# Patient Record
Sex: Male | Born: 1960
Health system: Southern US, Community
[De-identification: ages and names within clinical notes are randomized; demographics above are authoritative.]

## PROBLEM LIST (undated history)

## (undated) ENCOUNTER — Emergency Department (HOSPITAL_COMMUNITY): Admission: EM | Payer: 59 | Source: Home / Self Care

## (undated) HISTORY — PX: FOOT SURGERY: SHX648

---

## 2001-11-08 ENCOUNTER — Emergency Department (HOSPITAL_COMMUNITY): Admission: EM | Admit: 2001-11-08 | Discharge: 2001-11-08 | Payer: Self-pay | Admitting: Emergency Medicine

## 2001-12-26 HISTORY — PX: HEMORROIDECTOMY: SUR656

## 2003-06-06 ENCOUNTER — Ambulatory Visit (HOSPITAL_COMMUNITY): Admission: RE | Admit: 2003-06-06 | Discharge: 2003-06-06 | Payer: Self-pay | Admitting: General Surgery

## 2003-06-19 ENCOUNTER — Ambulatory Visit (HOSPITAL_COMMUNITY): Admission: RE | Admit: 2003-06-19 | Discharge: 2003-06-19 | Payer: Self-pay | Admitting: Podiatry

## 2004-05-21 ENCOUNTER — Ambulatory Visit (HOSPITAL_COMMUNITY): Admission: RE | Admit: 2004-05-21 | Discharge: 2004-05-21 | Payer: Self-pay | Admitting: General Surgery

## 2004-09-04 ENCOUNTER — Emergency Department (HOSPITAL_COMMUNITY): Admission: EM | Admit: 2004-09-04 | Discharge: 2004-09-04 | Payer: Self-pay | Admitting: Emergency Medicine

## 2005-12-06 ENCOUNTER — Emergency Department (HOSPITAL_COMMUNITY): Admission: EM | Admit: 2005-12-06 | Discharge: 2005-12-06 | Payer: Self-pay | Admitting: Emergency Medicine

## 2007-02-08 ENCOUNTER — Emergency Department (HOSPITAL_COMMUNITY): Admission: EM | Admit: 2007-02-08 | Discharge: 2007-02-08 | Payer: Self-pay | Admitting: Emergency Medicine

## 2009-06-01 ENCOUNTER — Emergency Department (HOSPITAL_COMMUNITY): Admission: EM | Admit: 2009-06-01 | Discharge: 2009-06-01 | Payer: Self-pay | Admitting: Family Medicine

## 2011-05-13 NOTE — H&P (Signed)
   NAME:  Gordon Richards, Gordon Richards                           ACCOUNT NO.:  [id number]   MEDICAL RECORD NO.:  [id number]                   PATIENT TYPE:  AMB   LOCATION:  DAY                                  FACILITY:  [facility]   PHYSICIAN:  Dalia Heading, M.D.               DATE OF BIRTH:  [birth year]   DATE OF ADMISSION:  DATE OF DISCHARGE:                                HISTORY & PHYSICAL   CHIEF COMPLAINT:  Hemorrhoidal disease.   HISTORY OF PRESENT ILLNESS:  The patient is a 50 year old white male who  is referred for evaluation and treatment of hemorrhoidal disease.  He has  had occasional hemorrhoidal problems in the past.  He started having a  painful, kind of swollen hemorrhoid lately.  It is irritated at the present  time.  Occasional blood is noted on the toilet paper when he wipes himself.  No fever, constipation or diarrhea have been noted.   PAST MEDICAL HISTORY:  Unremarkable.   PAST SURGICAL HISTORY:  Unremarkable.   MEDICATIONS:  None.   ALLERGIES:  No known drug allergies.   REVIEW OF SYSTEMS:  Noncontributory.   PHYSICAL EXAMINATION:  GENERAL APPEARANCE:  The patient is a well-developed,  well-nourished white male in no acute distress.  VITAL SIGNS:  Afebrile, stable.  LUNGS:  Clear to auscultation.  Breath sounds bilaterally.  HEART:  Regular rate and rhythm without S3, S4 or murmurs.  RECTAL:  An inflamed hemorrhoid noted at the 6 o'clock position.  External  hemorrhoidal skin tag is noted at the 12 o'clock  position.   IMPRESSION:  Hemorrhoidal disease.   PLAN:  The patient is scheduled for a hemorrhoidectomy on June 06, 2003.  The risks and benefits of the procedure including bleeding, infection and  recurrence of hemorrhoidal disease was fully explained to the patient,  gaining informed consent.                                                Dalia Heading, M.D.    MAJ/MEDQ  D:  06/05/2003  T:  06/05/2003  Job:  [id number]

## 2011-05-13 NOTE — Op Note (Signed)
NAME:  Gordon Richards, Gordon Richards NO.:  [id number]   MEDICAL RECORD NO.:  [id number]                  PATIENT TYPE:   LOCATION:                                       FACILITY:   PHYSICIAN:  Oley Balm. Pricilla Holm, D.P.M.             DATE OF BIRTH:   DATE OF PROCEDURE:  06/19/2003  DATE OF DISCHARGE:                                 OPERATIVE REPORT   ANESTHESIA:  Local, standby.   SURGEON:  Oley Balm. Pricilla Holm, D.P.M.   PREOPERATIVE DIAGNOSIS:  Long and plantar flexor second metatarsal, right  foot.   POSTOPERATIVE DIAGNOSIS:  Long and plantar flexor second metatarsal, right  foot.   OPERATION/PROCEDURE:  Shortening osteotomy of the second metatarsal, right  foot.   INDICATIONS FOR PROCEDURE:  Longstanding history of pain relieved by  conservative care.   DESCRIPTION OF PROCEDURE:  The patient was brought to the operating room and  placed on the operating table in the supine position.  The patient's lower  right foot and leg were then prepped and draped in the usual aseptic manner.  Then with an ankle tourniquet placed, well-padded to prevent contusion, we  elevated it to 250 mmHg.  After exsanguination of the right foot, the  following surgical procedures were then performed under local standby  anesthesia.   PROCEDURE 1:  Shortening, elevating osteotomy, second metatarsal, right  foot.  Attention was directed to the dorsal aspect of the right foot at the  level of the first and second MTP, where a curvilinear was incision made.  The incision was widened and deepened by blunt and sharp dissection, being  sure to identify and retract all vital structures.  A linear capsular  incision made.  The head of the metatarsal was removed from its soft tissue  attachments, dorsally, medially, laterally and plantarly.  Utilizing a  Zimmer oscillating saw, a double oblique osteotomy was made, removing the  intervening wedge of bone.  The metatarsal was put in a more  correct  anatomical as well as functional position.  The wound was lavaged with  copious amounts of sterile saline and then an osteotomy fixated with a 2 mm  screw.  At fixation it was noted that the osteotomy site was stable, any  protruding aspects of the metatarsal head resected, all rough edges rasped  smooth.  The wound was lavaged with copious amounts of sterile saline, and  the capsule and subcutaneous tissues were closed with 4-0 Dexons, and the  skin approximated with running subcuticular suture of 4-0 Dexon.   All surgical sites were infiltrated with approximately 1/8th cubic  centimeters of dexamethasone phosphate and mild compressive bandages  consisting of Betadine-soaked Adaptic, 4 x 4s, and sterile Kling were then  applied.  The patient tolerated the procedure well and left the operating  room in apparent good condition, vital signs stable, and taken to the  recovery room.  Oley Balm Pricilla Holm, D.P.M.    DBT/MEDQ  D:  06/19/2003  T:  06/19/2003  Job:  [id number]

## 2011-05-13 NOTE — Op Note (Signed)
NAME:  MINOR, IDEN                           ACCOUNT NO.:  [id number]   MEDICAL RECORD NO.:  [id number]                   PATIENT TYPE:  AMB   LOCATION:  DAY                                  FACILITY:  [facility]   PHYSICIAN:  Dalia Heading, M.D.               DATE OF BIRTH:  August 08, 1961   DATE OF PROCEDURE:  06/06/2003  DATE OF DISCHARGE:                                 OPERATIVE REPORT   PREOPERATIVE DIAGNOSIS:  Internal and external hemorrhoid, external  hemorrhoidal skin tag.   POSTOPERATIVE DIAGNOSIS:  Internal and external hemorrhoid, external  hemorrhoidal skin tag.   PROCEDURE:  Internal and external hemorrhoidectomy, excision of external  hemorrhoidal skin tag.   SURGEON:  Dalia Heading, M.D.   ANESTHESIA:  General.   INDICATIONS FOR PROCEDURE:  The patient is a 50 year old white male who  presents with both an internal and external hemorrhoid at the 6 o'clock  position and an external hemorrhoidal skin tag at the 12 o'clock position.  The risks and benefits of both procedures were fully explained to the  patient who gave informed consent.  In addition, he had several small skin  tags at both axillae that he would like removed.  Informed consent was  obtained for this procedure in addition.   DESCRIPTION OF PROCEDURE:  The patient was placed in the lithotomy position.  The perineum was prepped and draped using the usual sterile technique with  Betadine.  Surgical site confirmation was performed.   On rectal examination, the patient had an external hemorrhoidal skin tag at  the 12 o'clock position.  This was excised without difficulty.  The mucosa  was reapproximated using a 2-0 Vicryl running suture.  Next, an internal and  external hemorrhoidectomy was performed at the 6 o'clock position.  This was  done from the dentate line to the anal verge.  The hemorrhoid was removed  without difficulty.  The mucosa was reapproximated using a running 2-0  Vicryl suture.  No  other mass lesions were noted.  Sensorcaine 0.5% was  instilled into the surrounding perineum, and the rectum was packed with  Surgicel and viscous Xylocaine.   Next, two left axillary skin tags and two right axillary skin tags were  fulgurated and removed using Bovie electrocautery without difficulty.   The patient tolerated all procedures well.  The patient was awakened in the  operating room and went back to the recovery room awake and in stable  condition.   COMPLICATIONS:  None.    SPECIMENS:  Hemorrhoidal tissue.   ESTIMATED BLOOD LOSS:  Minimal.                                               Dalia Heading, M.D.    MAJ/MEDQ  D:  06/06/2003  T:  06/06/2003  Job:  [id number]
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
## 2013-11-05 NOTE — Patient Instructions (Signed)
    Gordon Richards  11/05/2013   Your procedure is scheduled on:  11/14/204  Report to [facility] at  730  AM.  Call this number if you have problems the morning of surgery: [phone]   Remember:   Do not eat food or drink liquids after midnight.   Take these medicines the morning of surgery with A SIP OF WATER: none   Do not wear jewelry, make-up or nail polish.  Do not wear lotions, powders, or perfumes.   Do not shave 48 hours prior to surgery. Men may shave face and neck.  Do not bring valuables to the hospital.  [facility] is not responsible for any belongings or valuables.               Contacts, dentures or bridgework may not be worn into surgery.  Leave suitcase in the car. After surgery it may be brought to your room.  For patients admitted to the hospital, discharge time is determined by your treatment team.               Patients discharged the day of surgery will not be allowed to drive home.  Name and phone number of your driver: family  Special Instructions: Shower using CHG 2 nights before surgery and the night before surgery.  If you shower the day of surgery use CHG.  Use special wash - you have one bottle of CHG for all showers.  You should use approximately 1/3 of the bottle for each shower.   Please read over the following fact sheets that you were given: Pain Booklet, Coughing and Deep Breathing, Surgical Site Infection Prevention, Anesthesia Post-op Instructions and Care and Recovery After Surgery PATIENT INSTRUCTIONS POST-ANESTHESIA  IMMEDIATELY FOLLOWING SURGERY:  Do not drive or operate machinery for the first twenty four hours after surgery.  Do not make any important decisions for twenty four hours after surgery or while taking narcotic pain medications or sedatives.  If you develop intractable nausea and vomiting or a severe headache please notify your doctor immediately.  FOLLOW-UP:  Please make an appointment with your surgeon as instructed. You do not  need to follow up with anesthesia unless specifically instructed to do so.  WOUND CARE INSTRUCTIONS (if applicable):  Keep a dry clean dressing on the anesthesia/puncture wound site if there is drainage.  Once the wound has quit draining you may leave it open to air.  Generally you should leave the bandage intact for twenty four hours unless there is drainage.  If the epidural site drains for more than 36-48 hours please call the anesthesia department.  QUESTIONS?:  Please feel free to call your physician or the hospital operator if you have any questions, and they will be happy to assist you.

## 2013-11-06 ENCOUNTER — Encounter (HOSPITAL_COMMUNITY): Payer: Self-pay | Admitting: Pharmacy Technician

## 2013-11-06 ENCOUNTER — Encounter (HOSPITAL_COMMUNITY)
Admission: RE | Admit: 2013-11-06 | Discharge: 2013-11-06 | Disposition: A | Discharge status: Home / Self Care | Payer: 59 | Source: Ambulatory Visit | Attending: General Surgery | Admitting: General Surgery

## 2013-11-06 ENCOUNTER — Encounter (HOSPITAL_COMMUNITY): Payer: Self-pay

## 2013-11-06 LAB — BASIC METABOLIC PANEL
BUN: 14 mg/dL (ref 6–23)
Chloride: 103 mEq/L (ref 96–112)
Creatinine, Ser: 0.93 mg/dL (ref 0.50–1.35)
GFR calc Af Amer: >90 mL/min (ref >90)
GFR calc non Af Amer: >90 mL/min (ref >90)

## 2013-11-06 LAB — CBC WITH DIFFERENTIAL/PLATELET
Basophils Absolute: 0 10*3/uL (ref 0.0–0.1)
Eosinophils Relative: 4 % (ref 0–5)
HCT: 46 % (ref 39.0–52.0)
MCV: 91.1 fL (ref 78.0–100.0)
Monocytes Absolute: 0.6 10*3/uL (ref 0.1–1.0)
Neutrophils Relative %: 54 % (ref 43–77)
RBC: 5.05 MIL/uL (ref 4.22–5.81)
RDW: 13.1 % (ref 11.5–15.5)
WBC: 5.1 10*3/uL (ref 4.0–10.5)

## 2013-11-08 ENCOUNTER — Encounter (HOSPITAL_COMMUNITY)
Admission: RE | Disposition: A | Discharge status: Home / Self Care | Payer: Self-pay | Source: Ambulatory Visit | Attending: General Surgery

## 2013-11-08 ENCOUNTER — Encounter (HOSPITAL_COMMUNITY): Payer: 59 | Admitting: Anesthesiology

## 2013-11-08 ENCOUNTER — Ambulatory Visit (HOSPITAL_COMMUNITY)
Admission: RE | Admit: 2013-11-08 | Discharge: 2013-11-08 | Disposition: A | Discharge status: Home / Self Care | Payer: 59 | Source: Ambulatory Visit | Attending: General Surgery | Admitting: General Surgery

## 2013-11-08 ENCOUNTER — Encounter (HOSPITAL_COMMUNITY): Payer: Self-pay | Admitting: *Deleted

## 2013-11-08 ENCOUNTER — Ambulatory Visit (HOSPITAL_COMMUNITY): Payer: 59 | Admitting: Anesthesiology

## 2013-11-08 DIAGNOSIS — D4959 Neoplasm of unspecified behavior of other genitourinary organ: Secondary | ICD-10-CM | POA: Insufficient documentation

## 2013-11-08 DIAGNOSIS — D499 Neoplasm of unspecified behavior of unspecified site: Secondary | ICD-10-CM | POA: Insufficient documentation

## 2013-11-08 DIAGNOSIS — D1739 Benign lipomatous neoplasm of skin and subcutaneous tissue of other sites: Secondary | ICD-10-CM | POA: Insufficient documentation

## 2013-11-08 DIAGNOSIS — Z01810 Encounter for preprocedural cardiovascular examination: Secondary | ICD-10-CM | POA: Insufficient documentation

## 2013-11-08 HISTORY — PX: LIPOMA EXCISION: SHX5283

## 2013-11-08 HISTORY — PX: MASS EXCISION: SHX2000

## 2013-11-08 SURGERY — EXCISION LIPOMA
Anesthesia: General | Site: Scrotum | Laterality: Right | Wound class: Clean

## 2013-11-08 MED ORDER — BUPIVACAINE HCL (PF) 0.5 % IJ SOLN
INTRAMUSCULAR | Status: DC | PRN
  Administered 2013-11-08 (×2): 3 mL

## 2013-11-08 MED ORDER — ONDANSETRON HCL 4 MG/2ML IJ SOLN
4.0000 mg | Freq: Once | INTRAMUSCULAR | Status: AC
  Administered 2013-11-08: 4 mg via INTRAVENOUS

## 2013-11-08 MED ORDER — GLYCOPYRROLATE 0.2 MG/ML IJ SOLN
INTRAMUSCULAR | Status: AC
  Filled 2013-11-08: qty 1

## 2013-11-08 MED ORDER — LIDOCAINE HCL 1 % IJ SOLN
INTRAMUSCULAR | Status: DC | PRN
  Administered 2013-11-08: 50 mg via INTRADERMAL

## 2013-11-08 MED ORDER — BUPIVACAINE HCL (PF) 0.5 % IJ SOLN
INTRAMUSCULAR | Status: AC
  Filled 2013-11-08: qty 30

## 2013-11-08 MED ORDER — FENTANYL CITRATE 0.05 MG/ML IJ SOLN: 25.0000 ug | INTRAMUSCULAR | Status: DC | PRN

## 2013-11-08 MED ORDER — MIDAZOLAM HCL 2 MG/2ML IJ SOLN
1.0000 mg | INTRAMUSCULAR | Status: DC | PRN
  Administered 2013-11-08: 2 mg via INTRAVENOUS

## 2013-11-08 MED ORDER — CHLORHEXIDINE GLUCONATE 4 % EX LIQD: 1.0000 "application " | Freq: Once | CUTANEOUS | Status: DC

## 2013-11-08 MED ORDER — MIDAZOLAM HCL 2 MG/2ML IJ SOLN
INTRAMUSCULAR | Status: AC
  Filled 2013-11-08: qty 2

## 2013-11-08 MED ORDER — POVIDONE-IODINE 10 % EX OINT
TOPICAL_OINTMENT | CUTANEOUS | Status: AC
  Filled 2013-11-08: qty 1

## 2013-11-08 MED ORDER — PROPOFOL 10 MG/ML IV BOLUS
INTRAVENOUS | Status: AC
  Filled 2013-11-08: qty 20

## 2013-11-08 MED ORDER — PROPOFOL 10 MG/ML IV BOLUS
INTRAVENOUS | Status: DC | PRN
  Administered 2013-11-08: 170 mg via INTRAVENOUS

## 2013-11-08 MED ORDER — MIDAZOLAM HCL 5 MG/5ML IJ SOLN
INTRAMUSCULAR | Status: DC | PRN
  Administered 2013-11-08: 2 mg via INTRAVENOUS

## 2013-11-08 MED ORDER — ONDANSETRON HCL 4 MG/2ML IJ SOLN
INTRAMUSCULAR | Status: AC
  Filled 2013-11-08: qty 2

## 2013-11-08 MED ORDER — GLYCOPYRROLATE 0.2 MG/ML IJ SOLN
0.2000 mg | Freq: Once | INTRAMUSCULAR | Status: AC
  Administered 2013-11-08: 0.2 mg via INTRAVENOUS
  Filled 2013-11-08: qty 1

## 2013-11-08 MED ORDER — 0.9 % SODIUM CHLORIDE (POUR BTL) OPTIME
TOPICAL | Status: DC | PRN
  Administered 2013-11-08: 1000 mL

## 2013-11-08 MED ORDER — CEFAZOLIN SODIUM-DEXTROSE 2-3 GM-% IV SOLR
2.0000 g | INTRAVENOUS | Status: AC
  Administered 2013-11-08: 2 g via INTRAVENOUS

## 2013-11-08 MED ORDER — FENTANYL CITRATE 0.05 MG/ML IJ SOLN
INTRAMUSCULAR | Status: AC
  Filled 2013-11-08: qty 2

## 2013-11-08 MED ORDER — LACTATED RINGERS IV SOLN
INTRAVENOUS | Status: DC
  Administered 2013-11-08: 07:00:00 via INTRAVENOUS

## 2013-11-08 MED ORDER — FENTANYL CITRATE 0.05 MG/ML IJ SOLN
25.0000 ug | INTRAMUSCULAR | Status: AC
  Administered 2013-11-08 (×2): 25 ug via INTRAVENOUS

## 2013-11-08 MED ORDER — CEFAZOLIN SODIUM-DEXTROSE 2-3 GM-% IV SOLR
INTRAVENOUS | Status: AC
  Filled 2013-11-08: qty 50

## 2013-11-08 MED ORDER — KETOROLAC TROMETHAMINE 30 MG/ML IJ SOLN: 30.0000 mg | Freq: Once | INTRAMUSCULAR | Status: DC

## 2013-11-08 MED ORDER — LIDOCAINE HCL (PF) 1 % IJ SOLN
INTRAMUSCULAR | Status: AC
  Filled 2013-11-08: qty 30

## 2013-11-08 MED ORDER — ONDANSETRON HCL 4 MG/2ML IJ SOLN: 4.0000 mg | Freq: Once | INTRAMUSCULAR | Status: DC | PRN

## 2013-11-08 MED ORDER — FENTANYL CITRATE 0.05 MG/ML IJ SOLN
INTRAMUSCULAR | Status: DC | PRN
  Administered 2013-11-08 (×2): 25 ug via INTRAVENOUS
  Administered 2013-11-08: 50 ug via INTRAVENOUS

## 2013-11-08 MED ORDER — LIDOCAINE HCL (PF) 1 % IJ SOLN
INTRAMUSCULAR | Status: AC
  Filled 2013-11-08: qty 5

## 2013-11-08 MED ORDER — HYDROCODONE-ACETAMINOPHEN 5-325 MG PO TABS: 1.0000 | ORAL_TABLET | Freq: Four times a day (QID) | ORAL | Status: AC | PRN

## 2013-11-08 MED ORDER — BACITRACIN ZINC 500 UNIT/GM EX OINT
TOPICAL_OINTMENT | CUTANEOUS | Status: AC
  Filled 2013-11-08: qty 0.9

## 2013-11-08 MED ORDER — DOXYCYCLINE HYCLATE 100 MG PO CAPS: 100.0000 mg | ORAL_CAPSULE | Freq: Two times a day (BID) | ORAL | Status: DC

## 2013-11-08 MED ORDER — FENTANYL CITRATE 0.05 MG/ML IJ SOLN
INTRAMUSCULAR | Status: AC
Start: 2013-11-08 — End: 2013-11-08
  Filled 2013-11-08: qty 2

## 2013-11-08 SURGICAL SUPPLY — 44 items
ADH SKN CLS APL DERMABOND .7 (GAUZE/BANDAGES/DRESSINGS) ×2
BAG HAMPER (MISCELLANEOUS) ×3 IMPLANT
BLADE SURG 15 STRL LF DISP TIS (BLADE) IMPLANT
BLADE SURG 15 STRL SS (BLADE) ×3
CLOTH BEACON ORANGE TIMEOUT ST (SAFETY) ×3 IMPLANT
COVER LIGHT HANDLE STERIS (MISCELLANEOUS) ×6 IMPLANT
DECANTER SPIKE VIAL GLASS SM (MISCELLANEOUS) ×3 IMPLANT
DERMABOND ADVANCED (GAUZE/BANDAGES/DRESSINGS) ×1
DERMABOND ADVANCED .7 DNX12 (GAUZE/BANDAGES/DRESSINGS) IMPLANT
DRAPE EENT ADH APERT 15X15 STR (DRAPES) ×1 IMPLANT
DURAPREP 26ML APPLICATOR (WOUND CARE) ×3 IMPLANT
ELECT NDL TIP 2.8 STRL (NEEDLE) IMPLANT
ELECT NEEDLE TIP 2.8 STRL (NEEDLE) ×3 IMPLANT
ELECT REM PT RETURN 9FT ADLT (ELECTROSURGICAL) ×3
ELECTRODE REM PT RTRN 9FT ADLT (ELECTROSURGICAL) ×2 IMPLANT
FORMALIN 10 PREFIL 120ML (MISCELLANEOUS) ×3 IMPLANT
GLOVE BIO SURGEON STRL SZ7.5 (GLOVE) ×3 IMPLANT
GLOVE BIOGEL PI IND STRL 7.0 (GLOVE) IMPLANT
GLOVE BIOGEL PI IND STRL 8 (GLOVE) ×2 IMPLANT
GLOVE BIOGEL PI INDICATOR 7.0 (GLOVE) ×1
GLOVE BIOGEL PI INDICATOR 8 (GLOVE) ×1
GLOVE ECLIPSE 7.0 STRL STRAW (GLOVE) ×1 IMPLANT
GLOVE ECLIPSE 7.5 STRL STRAW (GLOVE) ×3 IMPLANT
GLOVE EXAM NITRILE MD LF STRL (GLOVE) ×2 IMPLANT
GOWN STRL REIN XL XLG (GOWN DISPOSABLE) ×9 IMPLANT
KIT ROOM TURNOVER APOR (KITS) ×3 IMPLANT
MANIFOLD NEPTUNE II (INSTRUMENTS) ×3 IMPLANT
NDL HYPO 25X1 1.5 SAFETY (NEEDLE) ×2 IMPLANT
NEEDLE HYPO 25X1 1.5 SAFETY (NEEDLE) ×3 IMPLANT
NS IRRIG 1000ML POUR BTL (IV SOLUTION) ×3 IMPLANT
PACK MINOR (CUSTOM PROCEDURE TRAY) IMPLANT
PACK PERI GYN (CUSTOM PROCEDURE TRAY) ×1 IMPLANT
PAD ARMBOARD 7.5X6 YLW CONV (MISCELLANEOUS) ×3 IMPLANT
PAD TELFA 3X4 1S STER (GAUZE/BANDAGES/DRESSINGS) ×1 IMPLANT
SET BASIN LINEN APH (SET/KITS/TRAYS/PACK) ×3 IMPLANT
SPONGE GAUZE 4X4 12PLY (GAUZE/BANDAGES/DRESSINGS) ×1 IMPLANT
SUT CHROMIC 3 0 SH 27 (SUTURE) ×1 IMPLANT
SUT ETHILON 3 0 FSL (SUTURE) IMPLANT
SUT PROLENE 4 0 PS 2 18 (SUTURE) IMPLANT
SUT VIC AB 3-0 SH 27 (SUTURE)
SUT VIC AB 3-0 SH 27X BRD (SUTURE) IMPLANT
SUT VIC AB 4-0 PS2 27 (SUTURE) ×1 IMPLANT
SYR CONTROL 10ML LL (SYRINGE) ×3 IMPLANT
TOWEL OR 17X26 4PK STRL BLUE (TOWEL DISPOSABLE) ×3 IMPLANT

## 2013-11-08 NOTE — Anesthesia Preprocedure Evaluation (Addendum)
Anesthesia Evaluation  Patient identified by MRN, date of birth, ID band Patient awake    Reviewed: Allergy & Precautions, H&P , NPO status , Patient's Chart, lab work & pertinent test results  History of Anesthesia Complications Negative for: history of anesthetic complications  Airway Mallampati: II TM Distance: >3 FB     Dental  (+) Teeth Intact   Pulmonary Current Smoker (am cough),  breath sounds clear to auscultation        Cardiovascular negative cardio ROS  Rhythm:Regular Rate:Normal     Neuro/Psych    GI/Hepatic   Endo/Other  Morbid obesity  Renal/GU      Musculoskeletal   Abdominal   Peds  Hematology   Anesthesia Other Findings   Reproductive/Obstetrics                          Anesthesia Physical Anesthesia Plan  ASA: II  Anesthesia Plan: General   Post-op Pain Management:    Induction: Intravenous  Airway Management Planned: LMA  Additional Equipment:   Intra-op Plan:   Post-operative Plan: Extubation in OR  Informed Consent: I have reviewed the patients History and Physical, chart, labs and discussed the procedure including the risks, benefits and alternatives for the proposed anesthesia with the patient or authorized representative who has indicated his/her understanding and acceptance.     Plan Discussed with:   Anesthesia Plan Comments:        Anesthesia Quick Evaluation

## 2013-11-08 NOTE — Anesthesia Procedure Notes (Signed)
Procedure Name: LMA Insertion Date/Time: 11/08/2013 7:45 AM Performed by: Despina Hidden Pre-anesthesia Checklist: Emergency Drugs available, Patient identified, Suction available and Patient being monitored Patient Re-evaluated:Patient Re-evaluated prior to inductionOxygen Delivery Method: Circle system utilized Preoxygenation: Pre-oxygenation with 100% oxygen Intubation Type: IV induction Ventilation: Mask ventilation without difficulty LMA: LMA inserted LMA Size: 4.0 Tube type: Oral Number of attempts: 1 Placement Confirmation: positive ETCO2 and breath sounds checked- equal and bilateral Tube secured with: Tape Dental Injury: Teeth and Oropharynx as per pre-operative assessment

## 2013-11-08 NOTE — Anesthesia Postprocedure Evaluation (Signed)
  Anesthesia Post-op Note  Patient: Gordon Richards  Procedure(s) Performed: Procedure(s): EXCISION NEOPLASM RIGHT ARM (Right) EXCISION NEOPLASM SCROTUM (Right)  Patient Location: PACU  Anesthesia Type:General  Level of Consciousness: awake, alert , oriented and patient cooperative  Airway and Oxygen Therapy: Patient Spontanous Breathing  Post-op Pain: 2 /10, mild  Post-op Assessment: Post-op Vital signs reviewed, Patient's Cardiovascular Status Stable, Respiratory Function Stable, Patent Airway and Pain level controlled  Post-op Vital Signs: Reviewed and stable  Complications: No apparent anesthesia complications

## 2013-11-08 NOTE — Transfer of Care (Signed)
Immediate Anesthesia Transfer of Care Note  Patient: Gordon Richards  Procedure(s) Performed: Procedure(s): EXCISION NEOPLASM RIGHT ARM (Right) EXCISION NEOPLASM SCROTUM (Right)  Patient Location: PACU  Anesthesia Type:General  Level of Consciousness: awake, alert  and patient cooperative  Airway & Oxygen Therapy: Patient Spontanous Breathing and Patient connected to face mask oxygen  Post-op Assessment: Report given to PACU RN, Post -op Vital signs reviewed and stable and Patient moving all extremities  Post vital signs: Reviewed and stable  Complications: No apparent anesthesia complications

## 2013-11-08 NOTE — Op Note (Signed)
Patient:  Gordon Richards  DOB:  23-Jan-1961  MRN:  [id number]   Preop Diagnosis:  Soft tissue neoplasms, right arm and scrotum  Postop Diagnosis:  Same  Procedure:  Excision of soft tissue neoplasms, right arm and scrotum  Surgeon:  Franky Macho, M.D.  Anes:  General  Indications:  Patient is a 52 year old white male who presents with enlarging soft tissue masses in the right arm and scrotum. The risks and benefits of the procedure were fully explained to the patient, who gave informed consent.  Procedure note:  The patient was placed in the lithotomy position after general anesthesia was administered. The right arm and scrotum were prepped and draped using usual sterile technique with DuraPrep and Betadine. Surgical site confirmation was performed.  The patient had a 2 cm ovoid subcutaneous mass in the right arm. A longitudinal incision was made over the mass and the mass was removed. It appeared to be a lipoma. He was sent to pathology further examination. Any bleeding was controlled using Bovie electrocautery. The skin was reapproximated using a 4-0 Vicryl subcuticular suture. 0.5% Sensorcaine was instilled the surrounding wound. Dermabond was then applied.  Next, a 2 cm subcutaneous ovoid mass was noted along the right inferior aspect of the scrotum. An incision was made and the mass appeared to be somewhat necrotic in nature. It did not extend into the deeper scrotal sac. It was excised using sharp dissection and sent to pathology further examination. A bleeding was controlled using Bovie electrocautery. The skin was reapproximated using 3-0 chromic gut interrupted suture. 0.5% Sensorcaine was instilled the surrounding wound. Betadine ointment was then applied.  All tape and needle counts were correct at the end of the procedure. Patient was awakened and transferred to PACU in stable condition.  Complications:  None  EBL:  Minimal  Specimen:  Right arm soft tissue mass, scrotal soft  tissue mass

## 2013-11-08 NOTE — Interval H&P Note (Signed)
History and Physical Interval Note:  11/08/2013 7:31 AM  Gordon Richards  has presented today for surgery, with the diagnosis of neoplasms of right arm and scrotum  The various methods of treatment have been discussed with the patient and family. After consideration of risks, benefits and other options for treatment, the patient has consented to  Procedure(s): EXCISION NEOPLASM RIGHT ARM (Right) EXCISION NEOPLASM SCROTUM (Right) as a surgical intervention .  The patient's history has been reviewed, patient examined, no change in status, stable for surgery.  I have reviewed the patient's chart and labs.  Questions were answered to the patient's satisfaction.     Franky Macho A

## 2013-11-11 ENCOUNTER — Encounter (HOSPITAL_COMMUNITY): Payer: Self-pay | Admitting: General Surgery

## 2015-06-13 ENCOUNTER — Encounter (HOSPITAL_COMMUNITY): Payer: Self-pay | Admitting: Emergency Medicine

## 2015-06-13 ENCOUNTER — Emergency Department (HOSPITAL_COMMUNITY)
Admission: EM | Admit: 2015-06-13 | Discharge: 2015-06-13 | Disposition: A | Discharge status: Home / Self Care | Payer: 59 | Attending: Emergency Medicine | Admitting: Emergency Medicine

## 2015-06-13 ENCOUNTER — Emergency Department (HOSPITAL_COMMUNITY): Payer: 59

## 2015-06-13 DIAGNOSIS — R109 Unspecified abdominal pain: Secondary | ICD-10-CM

## 2015-06-13 DIAGNOSIS — Z792 Long term (current) use of antibiotics: Secondary | ICD-10-CM | POA: Diagnosis not present

## 2015-06-13 DIAGNOSIS — K2970 Gastritis, unspecified, without bleeding: Secondary | ICD-10-CM | POA: Diagnosis not present

## 2015-06-13 DIAGNOSIS — R1012 Left upper quadrant pain: Secondary | ICD-10-CM | POA: Diagnosis present

## 2015-06-13 DIAGNOSIS — Z7982 Long term (current) use of aspirin: Secondary | ICD-10-CM | POA: Insufficient documentation

## 2015-06-13 DIAGNOSIS — Z79899 Other long term (current) drug therapy: Secondary | ICD-10-CM | POA: Diagnosis not present

## 2015-06-13 DIAGNOSIS — Z720 Tobacco use: Secondary | ICD-10-CM | POA: Insufficient documentation

## 2015-06-13 DIAGNOSIS — R112 Nausea with vomiting, unspecified: Secondary | ICD-10-CM

## 2015-06-13 LAB — CBC WITH DIFFERENTIAL/PLATELET
Basophils Absolute: 0 10*3/uL (ref 0.0–0.1)
Basophils Relative: 0 % (ref 0–1)
Eosinophils Absolute: 0 10*3/uL (ref 0.0–0.7)
Eosinophils Relative: 0 % (ref 0–5)
HCT: 45 % (ref 39.0–52.0)
HEMOGLOBIN: 15 g/dL (ref 13.0–17.0)
LYMPHS ABS: 0.7 10*3/uL (ref 0.7–4.0)
Lymphocytes Relative: 7 % — ABNORMAL LOW (ref 12–46)
MCH: 30.1 pg (ref 26.0–34.0)
MCHC: 33.3 g/dL (ref 30.0–36.0)
MCV: 90.2 fL (ref 78.0–100.0)
MONO ABS: 0.2 10*3/uL (ref 0.1–1.0)
MONOS PCT: 2 % — AB (ref 3–12)
Neutro Abs: 9.4 10*3/uL — ABNORMAL HIGH (ref 1.7–7.7)
Neutrophils Relative %: 91 % — ABNORMAL HIGH (ref 43–77)
Platelets: 207 10*3/uL (ref 150–400)
RBC: 4.99 MIL/uL (ref 4.22–5.81)
RDW: 13.1 % (ref 11.5–15.5)
WBC: 10.4 10*3/uL (ref 4.0–10.5)

## 2015-06-13 LAB — LACTIC ACID, PLASMA: LACTIC ACID, VENOUS: 1.5 mmol/L (ref 0.5–2.0)

## 2015-06-13 LAB — COMPREHENSIVE METABOLIC PANEL
ALT: 31 U/L (ref 17–63)
AST: 26 U/L (ref 15–41)
Albumin: 4.1 g/dL (ref 3.5–5.0)
Alkaline Phosphatase: 46 U/L (ref 38–126)
Anion gap: 11 (ref 5–15)
BUN: 16 mg/dL (ref 6–20)
CHLORIDE: 107 mmol/L (ref 101–111)
CO2: 22 mmol/L (ref 22–32)
Calcium: 9 mg/dL (ref 8.9–10.3)
Creatinine, Ser: 0.8 mg/dL (ref 0.61–1.24)
GFR calc Af Amer: >60 mL/min (ref >60)
Glucose, Bld: 174 mg/dL — ABNORMAL HIGH (ref 65–99)
Potassium: 3.7 mmol/L (ref 3.5–5.1)
Sodium: 140 mmol/L (ref 135–145)
Total Bilirubin: 0.8 mg/dL (ref 0.3–1.2)
Total Protein: 7.4 g/dL (ref 6.5–8.1)

## 2015-06-13 LAB — URINE MICROSCOPIC-ADD ON

## 2015-06-13 LAB — URINALYSIS, ROUTINE W REFLEX MICROSCOPIC
Bilirubin Urine: NEGATIVE
GLUCOSE, UA: NEGATIVE mg/dL
HGB URINE DIPSTICK: NEGATIVE
KETONES UR: 40 mg/dL — AB
Leukocytes, UA: NEGATIVE
Nitrite: NEGATIVE
PH: 6 (ref 5.0–8.0)
Specific Gravity, Urine: 1.025 (ref 1.005–1.030)
Urobilinogen, UA: 0.2 mg/dL (ref 0.0–1.0)

## 2015-06-13 LAB — LIPASE, BLOOD: LIPASE: 22 U/L (ref 22–51)

## 2015-06-13 MED ORDER — IOHEXOL 300 MG/ML  SOLN
25.0000 mL | Freq: Once | INTRAMUSCULAR | Status: AC | PRN
Start: 2015-06-13 — End: 2015-06-13
  Administered 2015-06-13: 25 mL via ORAL

## 2015-06-13 MED ORDER — ONDANSETRON HCL 4 MG PO TABS: 4.0000 mg | ORAL_TABLET | Freq: Four times a day (QID) | ORAL | Status: DC

## 2015-06-13 MED ORDER — ONDANSETRON HCL 4 MG/2ML IJ SOLN
4.0000 mg | Freq: Once | INTRAMUSCULAR | Status: AC
  Administered 2015-06-13: 4 mg via INTRAVENOUS
  Filled 2015-06-13: qty 2

## 2015-06-13 MED ORDER — SODIUM CHLORIDE 0.9 % IV BOLUS (SEPSIS)
1000.0000 mL | Freq: Once | INTRAVENOUS | Status: AC
  Administered 2015-06-13: 1000 mL via INTRAVENOUS

## 2015-06-13 MED ORDER — HYDROCODONE-ACETAMINOPHEN 5-325 MG PO TABS: 2.0000 | ORAL_TABLET | ORAL | Status: DC | PRN

## 2015-06-13 MED ORDER — IOHEXOL 300 MG/ML  SOLN
100.0000 mL | Freq: Once | INTRAMUSCULAR | Status: AC | PRN
  Administered 2015-06-13: 100 mL via INTRAVENOUS

## 2015-06-13 MED ORDER — FAMOTIDINE IN NACL 20-0.9 MG/50ML-% IV SOLN
20.0000 mg | Freq: Once | INTRAVENOUS | Status: AC
  Administered 2015-06-13: 20 mg via INTRAVENOUS
  Filled 2015-06-13: qty 50

## 2015-06-13 MED ORDER — RANITIDINE HCL 150 MG PO TABS: 150.0000 mg | ORAL_TABLET | Freq: Two times a day (BID) | ORAL | Status: DC

## 2015-06-13 MED ORDER — DICYCLOMINE HCL 20 MG PO TABS: 20.0000 mg | ORAL_TABLET | Freq: Four times a day (QID) | ORAL | Status: DC | PRN

## 2015-06-13 MED ORDER — HYDROMORPHONE HCL 1 MG/ML IJ SOLN
1.0000 mg | Freq: Once | INTRAMUSCULAR | Status: AC
  Administered 2015-06-13: 1 mg via INTRAVENOUS
  Filled 2015-06-13: qty 1

## 2015-06-13 MED ORDER — SODIUM CHLORIDE 0.9 % IJ SOLN
INTRAMUSCULAR | Status: AC
  Filled 2015-06-13: qty 500

## 2015-06-13 MED ORDER — MORPHINE SULFATE 4 MG/ML IJ SOLN
4.0000 mg | Freq: Once | INTRAMUSCULAR | Status: AC
  Administered 2015-06-13: 4 mg via INTRAVENOUS
  Filled 2015-06-13: qty 1

## 2015-06-13 MED ORDER — SODIUM CHLORIDE 0.9 % IJ SOLN
INTRAMUSCULAR | Status: AC
  Filled 2015-06-13: qty 45

## 2015-06-13 NOTE — Discharge Instructions (Signed)
Abdominal Pain °Many things can cause abdominal pain. Usually, abdominal pain is not caused by a disease and will improve without treatment. It can often be observed and treated at home. Your health care provider will do a physical exam and possibly order blood tests and X-rays to help determine the seriousness of your pain. However, in many cases, more time must pass before a clear cause of the pain can be found. Before that point, your health care provider may not know if you need more testing or further treatment. °HOME CARE INSTRUCTIONS  °Monitor your abdominal pain for any changes. The following actions may help to alleviate any discomfort you are experiencing: °· Only take over-the-counter or prescription medicines as directed by your health care provider. °· Do not take laxatives unless directed to do so by your health care provider. °· Try a clear liquid diet (broth, tea, or water) as directed by your health care provider. Slowly move to a bland diet as tolerated. °SEEK MEDICAL CARE IF: °· You have unexplained abdominal pain. °· You have abdominal pain associated with nausea or diarrhea. °· You have pain when you urinate or have a bowel movement. °· You experience abdominal pain that wakes you in the night. °· You have abdominal pain that is worsened or improved by eating food. °· You have abdominal pain that is worsened with eating fatty foods. °· You have a fever. °SEEK IMMEDIATE MEDICAL CARE IF:  °· Your pain does not go away within 2 hours. °· You keep throwing up (vomiting). °· Your pain is felt only in portions of the abdomen, such as the right side or the left lower portion of the abdomen. °· You pass bloody or black tarry stools. °MAKE SURE YOU: °· Understand these instructions.   °· Will watch your condition.   °· Will get help right away if you are not doing well or get worse.   °Document Released: 09/21/2005 Document Revised: 12/17/2013 Document Reviewed: 08/21/2013 °[employer]® Patient Information  ©2015 [employer]. This information is not intended to replace advice given to you by your health care provider. Make sure you discuss any questions you have with your health care provider. ° °Gastritis, Adult °Gastritis is soreness and swelling (inflammation) of the lining of the stomach. Gastritis can develop as a sudden onset (acute) or long-term (chronic) condition. If gastritis is not treated, it can lead to stomach bleeding and ulcers. °CAUSES  °Gastritis occurs when the stomach lining is weak or damaged. Digestive juices from the stomach then inflame the weakened stomach lining. The stomach lining may be weak or damaged due to viral or bacterial infections. One common bacterial infection is the Helicobacter pylori infection. Gastritis can also result from excessive alcohol consumption, taking certain medicines, or having too much acid in the stomach.  °SYMPTOMS  °In some cases, there are no symptoms. When symptoms are present, they may include: °· Pain or a burning sensation in the upper abdomen. °· Nausea. °· Vomiting. °· An uncomfortable feeling of fullness after eating. °DIAGNOSIS  °Your caregiver may suspect you have gastritis based on your symptoms and a physical exam. To determine the cause of your gastritis, your caregiver may perform the following: °· Blood or stool tests to check for the H pylori bacterium. °· Gastroscopy. A thin, flexible tube (endoscope) is passed down the esophagus and into the stomach. The endoscope has a light and camera on the end. Your caregiver uses the endoscope to view the inside of the stomach. °· Taking a tissue sample (biopsy)   from the stomach to examine under a microscope. TREATMENT  Depending on the cause of your gastritis, medicines may be prescribed. If you have a bacterial infection, such as an H pylori infection, antibiotics may be given. If your gastritis is caused by too much acid in the stomach, H2 blockers or antacids may be given. Your caregiver may  recommend that you stop taking aspirin, ibuprofen, or other nonsteroidal anti-inflammatory drugs (NSAIDs). HOME CARE INSTRUCTIONS  Only take over-the-counter or prescription medicines as directed by your caregiver.  If you were given antibiotic medicines, take them as directed. Finish them even if you start to feel better.  Drink enough fluids to keep your urine clear or pale yellow.  Avoid foods and drinks that make your symptoms worse, such as:  Caffeine or alcoholic drinks.  Chocolate.  Peppermint or mint flavorings.  Garlic and onions.  Spicy foods.  Citrus fruits, such as oranges, lemons, or limes.  Tomato-based foods such as sauce, chili, salsa, and pizza.  Fried and fatty foods.  Eat small, frequent meals instead of large meals. SEEK IMMEDIATE MEDICAL CARE IF:   You have black or dark red stools.  You vomit blood or material that looks like coffee grounds.  You are unable to keep fluids down.  Your abdominal pain gets worse.  You have a fever.  You do not feel better after 1 week.  You have any other questions or concerns. MAKE SURE YOU:  Understand these instructions.  Will watch your condition.  Will get help right away if you are not doing well or get worse. Document Released: 12/06/2001 Document Revised: 06/12/2012 Document Reviewed: 01/25/2012 [facility] Patient Information 2015 [place], [place]. This information is not intended to replace advice given to you by your health care provider. Make sure you discuss any questions you have with your health care provider.  Nausea and Vomiting Nausea is a sick feeling that often comes before throwing up (vomiting). Vomiting is a reflex where stomach contents come out of your mouth. Vomiting can cause severe loss of body fluids (dehydration). Children and elderly adults can become dehydrated quickly, especially if they also have diarrhea. Nausea and vomiting are symptoms of a condition or disease. It is important  to find the cause of your symptoms. CAUSES   Direct irritation of the stomach lining. This irritation can result from increased acid production (gastroesophageal reflux disease), infection, food poisoning, taking certain medicines (such as nonsteroidal anti-inflammatory drugs), alcohol use, or tobacco use.  Signals from the brain.These signals could be caused by a headache, heat exposure, an inner ear disturbance, increased pressure in the brain from injury, infection, a tumor, or a concussion, pain, emotional stimulus, or metabolic problems.  An obstruction in the gastrointestinal tract (bowel obstruction).  Illnesses such as diabetes, hepatitis, gallbladder problems, appendicitis, kidney problems, cancer, sepsis, atypical symptoms of a heart attack, or eating disorders.  Medical treatments such as chemotherapy and radiation.  Receiving medicine that makes you sleep (general anesthetic) during surgery. DIAGNOSIS Your caregiver may ask for tests to be done if the problems do not improve after a few days. Tests may also be done if symptoms are severe or if the reason for the nausea and vomiting is not clear. Tests may include:  Urine tests.  Blood tests.  Stool tests.  Cultures (to look for evidence of infection).  X-rays or other imaging studies. Test results can help your caregiver make decisions about treatment or the need for additional tests. TREATMENT You need to stay well  hydrated. Drink frequently but in small amounts.You may wish to drink water, sports drinks, clear broth, or eat frozen ice pops or gelatin dessert to help stay hydrated.When you eat, eating slowly may help prevent nausea.There are also some antinausea medicines that may help prevent nausea. HOME CARE INSTRUCTIONS   Take all medicine as directed by your caregiver.  If you do not have an appetite, do not force yourself to eat. However, you must continue to drink fluids.  If you have an appetite, eat a  normal diet unless your caregiver tells you differently.  Eat a variety of complex carbohydrates (rice, wheat, potatoes, bread), lean meats, yogurt, fruits, and vegetables.  Avoid high-fat foods because they are more difficult to digest.  Drink enough water and fluids to keep your urine clear or pale yellow.  If you are dehydrated, ask your caregiver for specific rehydration instructions. Signs of dehydration may include:  Severe thirst.  Dry lips and mouth.  Dizziness.  Dark urine.  Decreasing urine frequency and amount.  Confusion.  Rapid breathing or pulse. SEEK IMMEDIATE MEDICAL CARE IF:   You have blood or brown flecks (like coffee grounds) in your vomit.  You have black or bloody stools.  You have a severe headache or stiff neck.  You are confused.  You have severe abdominal pain.  You have chest pain or trouble breathing.  You do not urinate at least once every 8 hours.  You develop cold or clammy skin.  You continue to vomit for longer than 24 to 48 hours.  You have a fever. MAKE SURE YOU:   Understand these instructions.  Will watch your condition.  Will get help right away if you are not doing well or get worse. Document Released: 12/12/2005 Document Revised: 03/05/2012 Document Reviewed: 05/11/2011 [facility] Patient Information 2015 [place], [place]. This information is not intended to replace advice given to you by your health care provider. Make sure you discuss any questions you have with your health care provider.

## 2015-06-13 NOTE — ED Notes (Addendum)
Pt reports abdominal pain,n/v since last night. Pt pacing in room.

## 2015-06-13 NOTE — ED Provider Notes (Signed)
CSN: [id number]     Arrival date & time 06/13/15  1309 History   First MD Initiated Contact with Patient 06/13/15 1312     Chief Complaint  Patient presents with  . Abdominal Pain     (Consider location/radiation/quality/duration/timing/severity/associated sxs/prior Treatment) HPI Comments: Patient presents to the emergency department for evaluation of nausea, vomiting and abdominal pain. Symptoms began last night. Patient reports that he did drink 3 beers prior to onset of symptoms. He also has had a change in his diet this week. Patient reports constant crampy pain in the left upper abdomen associated with the vomiting. He has not had any fever. He has not had any hematemesis, no melanoma.  Patient is a 54 y.o. male presenting with abdominal pain.  Abdominal Pain Associated symptoms: nausea and vomiting     History reviewed. No pertinent past medical history. Past Surgical History  Procedure Laterality Date  . Hemorroidectomy  2003  . Foot surgery Right     bone cutting  . Lipoma excision Right 11/08/2013    Procedure: EXCISION NEOPLASM RIGHT ARM;  Surgeon: [name], MD;  Location: [facility];  Service: General;  Laterality: Right;  . Mass excision Right 11/08/2013    Procedure: EXCISION NEOPLASM SCROTUM;  Surgeon: [name], MD;  Location: [facility];  Service: General;  Laterality: Right;   History reviewed. No pertinent family history. History  Substance Use Topics  . Smoking status: Current Every Day Smoker -- 0.50 packs/day for 3 years    Types: Cigarettes  . Smokeless tobacco: Not on file  . Alcohol Use: Yes     Comment: rarely    Review of Systems  Gastrointestinal: Positive for nausea, vomiting and abdominal pain.  All other systems reviewed and are negative.     Allergies  Review of patient's allergies indicates no known allergies.  Home Medications   Prior to Admission medications   Medication Sig Start Date End Date Taking? Authorizing Provider   aspirin EC 325 MG tablet Take 325 mg by mouth daily.   Yes Historical Provider, MD  Fish Oil-Cholecalciferol (FISH OIL + D3 PO) Take 1 capsule by mouth daily.   Yes Historical Provider, MD  ibuprofen (ADVIL,MOTRIN) 200 MG tablet Take 800 mg by mouth every 6 (six) hours as needed for moderate pain. pain   Yes Historical Provider, MD  vitamin C (ASCORBIC ACID) 500 MG tablet Take 500 mg by mouth daily.   Yes Historical Provider, MD  doxycycline (VIBRAMYCIN) 100 MG capsule Take 1 capsule (100 mg total) by mouth 2 (two) times daily. Patient not taking: Reported on 06/13/2015 11/08/13   Aviva Signs Md, MD   BP 118/93 mmHg  Pulse 73  Temp(Src) 98.4 F (36.9 C) (Oral)  Resp 15  Ht 5\' 10"  (1.778 m)  Wt 228 lb (103.42 kg)  BMI 32.71 kg/m2  SpO2 99% Physical Exam  Constitutional: He is oriented to person, place, and time. He appears well-developed and well-nourished. No distress.  HENT:  Head: Normocephalic and atraumatic.  Right Ear: Hearing normal.  Left Ear: Hearing normal.  Nose: Nose normal.  Mouth/Throat: Oropharynx is clear and moist and mucous membranes are normal.  Eyes: Conjunctivae and EOM are normal. Pupils are equal, round, and reactive to light.  Neck: Normal range of motion. Neck supple.  Cardiovascular: Regular rhythm, S1 normal and S2 normal.  Exam reveals no gallop and no friction rub.   No murmur heard. Pulmonary/Chest: Effort normal and breath sounds normal. No respiratory distress. He  exhibits no tenderness.  Abdominal: Soft. Normal appearance and bowel sounds are normal. There is no hepatosplenomegaly. There is tenderness in the left upper quadrant. There is no rebound, no guarding, no tenderness at McBurney's point and negative Murphy's sign. No hernia.  Musculoskeletal: Normal range of motion.  Neurological: He is alert and oriented to person, place, and time. He has normal strength. No cranial nerve deficit or sensory deficit. Coordination normal. GCS eye subscore is 4.  GCS verbal subscore is 5. GCS motor subscore is 6.  Skin: Skin is warm, dry and intact. No rash noted. No cyanosis.  Psychiatric: He has a normal mood and affect. His speech is normal and behavior is normal. Thought content normal.  Nursing note and vitals reviewed.   ED Course  Procedures (including critical care time) Labs Review Labs Reviewed  CBC WITH DIFFERENTIAL/PLATELET - Abnormal; Notable for the following:    Neutrophils Relative % 91 (*)    Neutro Abs 9.4 (*)    Lymphocytes Relative 7 (*)    Monocytes Relative 2 (*)    All other components within normal limits  COMPREHENSIVE METABOLIC PANEL - Abnormal; Notable for the following:    Glucose, Bld 174 (*)    All other components within normal limits  URINALYSIS, ROUTINE W REFLEX MICROSCOPIC (NOT AT [facility]) - Abnormal; Notable for the following:    Ketones, ur 40 (*)    Protein, ur TRACE (*)    All other components within normal limits  LIPASE, BLOOD  LACTIC ACID, PLASMA  URINE MICROSCOPIC-ADD ON    Imaging Review Ct Abdomen Pelvis W Contrast  06/13/2015   CLINICAL DATA:  Abdominal pain, nausea and vomiting since last night.  EXAM: CT ABDOMEN AND PELVIS WITH CONTRAST  TECHNIQUE: Multidetector CT imaging of the abdomen and pelvis was performed using the standard protocol following bolus administration of intravenous contrast.  CONTRAST:  4mL OMNIPAQUE IOHEXOL 300 MG/ML SOLN, 174mL OMNIPAQUE IOHEXOL 300 MG/ML SOLN  COMPARISON:  05/21/2004.  FINDINGS: Interval probable tiny cyst in the right lobe of the liver, adjacent to the gallbladder. The previously seen diffuse low density of the liver relative to the spleen is no longer demonstrated.  Multiple colonic diverticula without evidence of diverticulitis. Normal appearing and normally opacified appendix without evidence of appendicitis. No gastric or small bowel abnormalities.  Small left inguinal hernia containing fat. Unremarkable spleen, pancreas, gallbladder, right adrenal gland,  kidneys, urinary bladder and prostate gland.  Interval oval, low density left adrenal mass, measuring 2.6 x 2.2 cm on image number 30. This measures 22 Hounsfield units in density on image number 29.  No enlarged lymph nodes. No free peritoneal fluid or air. Interval minimal linear atelectasis or scarring at the right lung base. The previously seen 10 mm right lower lobe nodule is no longer visualized. Minimal bilateral hip degenerative changes. Lumbar and lower thoracic spine degenerative changes.  IMPRESSION: 1. No acute abnormality. 2. Colonic diverticulosis. 3. Small left inguinal hernia containing fat. 4. Interval 2.6 cm probable left adrenal adenoma. 5. The previously demonstrated 10 mm right lower lobe nodule is no longer seen. 6. Resolved hepatic steatosis. 7. Interval probable tiny right lobe liver cyst.   Electronically Signed   By: Claudie Revering M.D.   On: 06/13/2015 16:36     EKG Interpretation   Date/Time:  Saturday June 13 2015 13:27:44 EDT Ventricular Rate:  45 PR Interval:  176 QRS Duration: 115 QT Interval:  476 QTC Calculation: 412 R Axis:   71 Text Interpretation:  Sinus bradycardia Nonspecific intraventricular  conduction delay Confirmed by Lakechia Nay  MD, Kyrstyn Greear [phone]) on  06/13/2015 1:38:17 PM      MDM   Final diagnoses:  Abdominal pain   gastritis Nausea and vomiting  Patient presented to the ER for evaluation of abdominal pain with nausea and vomiting. Symptoms began last night. Patient complaining of pain primarily in the left upper abdominal area. There is mild tenderness in this region without guarding or rebound. No Murphy sign. No tenderness in the right upper quadrant. No pain below the umbilicus. Patient's lab work was unremarkable. Patient improved with IV fluids, antiemetics, analgesia. CT scan was performed to further evaluate. CT scan was unremarkable. Patient will be discharged, continue symptomatic treatment.    Orpah Greek, MD 06/13/15  [phone]

## 2015-06-13 NOTE — ED Notes (Signed)
Pt verbalized understanding of no driving and to use caution within 4 hours of taking pain meds due to meds cause drowsiness 

## 2015-06-15 ENCOUNTER — Emergency Department (HOSPITAL_COMMUNITY)
Admission: EM | Admit: 2015-06-15 | Discharge: 2015-06-15 | Disposition: A | Discharge status: Home / Self Care | Payer: 59 | Attending: Emergency Medicine | Admitting: Emergency Medicine

## 2015-06-15 ENCOUNTER — Encounter (HOSPITAL_COMMUNITY): Payer: Self-pay | Admitting: Emergency Medicine

## 2015-06-15 DIAGNOSIS — Z792 Long term (current) use of antibiotics: Secondary | ICD-10-CM | POA: Insufficient documentation

## 2015-06-15 DIAGNOSIS — R109 Unspecified abdominal pain: Secondary | ICD-10-CM | POA: Diagnosis present

## 2015-06-15 DIAGNOSIS — Z720 Tobacco use: Secondary | ICD-10-CM | POA: Insufficient documentation

## 2015-06-15 DIAGNOSIS — R1084 Generalized abdominal pain: Secondary | ICD-10-CM | POA: Insufficient documentation

## 2015-06-15 DIAGNOSIS — Z79899 Other long term (current) drug therapy: Secondary | ICD-10-CM | POA: Diagnosis not present

## 2015-06-15 DIAGNOSIS — Z7982 Long term (current) use of aspirin: Secondary | ICD-10-CM | POA: Diagnosis not present

## 2015-06-15 LAB — COMPREHENSIVE METABOLIC PANEL
ALT: 33 U/L (ref 17–63)
AST: 31 U/L (ref 15–41)
Albumin: 4.3 g/dL (ref 3.5–5.0)
Alkaline Phosphatase: 48 U/L (ref 38–126)
Anion gap: 13 (ref 5–15)
BILIRUBIN TOTAL: 0.7 mg/dL (ref 0.3–1.2)
BUN: 16 mg/dL (ref 6–20)
CALCIUM: 8.9 mg/dL (ref 8.9–10.3)
CHLORIDE: 104 mmol/L (ref 101–111)
CO2: 23 mmol/L (ref 22–32)
Creatinine, Ser: 0.86 mg/dL (ref 0.61–1.24)
GLUCOSE: 137 mg/dL — AB (ref 65–99)
Potassium: 3.8 mmol/L (ref 3.5–5.1)
SODIUM: 140 mmol/L (ref 135–145)
Total Protein: 7.6 g/dL (ref 6.5–8.1)

## 2015-06-15 LAB — CBC WITH DIFFERENTIAL/PLATELET
Basophils Absolute: 0 10*3/uL (ref 0.0–0.1)
Basophils Relative: 0 % (ref 0–1)
Eosinophils Absolute: 0.1 10*3/uL (ref 0.0–0.7)
Eosinophils Relative: 1 % (ref 0–5)
HCT: 45.9 % (ref 39.0–52.0)
HEMOGLOBIN: 15.5 g/dL (ref 13.0–17.0)
LYMPHS ABS: 1.9 10*3/uL (ref 0.7–4.0)
Lymphocytes Relative: 14 % (ref 12–46)
MCH: 30.6 pg (ref 26.0–34.0)
MCHC: 33.8 g/dL (ref 30.0–36.0)
MCV: 90.7 fL (ref 78.0–100.0)
Monocytes Absolute: 0.7 10*3/uL (ref 0.1–1.0)
Monocytes Relative: 5 % (ref 3–12)
NEUTROS PCT: 80 % — AB (ref 43–77)
Neutro Abs: 10.8 10*3/uL — ABNORMAL HIGH (ref 1.7–7.7)
PLATELETS: 211 10*3/uL (ref 150–400)
RBC: 5.06 MIL/uL (ref 4.22–5.81)
RDW: 13.2 % (ref 11.5–15.5)
WBC: 13.5 10*3/uL — AB (ref 4.0–10.5)

## 2015-06-15 LAB — I-STAT CG4 LACTIC ACID, ED: LACTIC ACID, VENOUS: 3.42 mmol/L — AB (ref 0.5–2.0)

## 2015-06-15 LAB — LIPASE, BLOOD: LIPASE: 36 U/L (ref 22–51)

## 2015-06-15 MED ORDER — SODIUM CHLORIDE 0.9 % IV BOLUS (SEPSIS)
1000.0000 mL | Freq: Once | INTRAVENOUS | Status: AC
  Administered 2015-06-15: 1000 mL via INTRAVENOUS

## 2015-06-15 MED ORDER — HYDROMORPHONE HCL 1 MG/ML IJ SOLN
1.0000 mg | Freq: Once | INTRAMUSCULAR | Status: AC
  Administered 2015-06-15: 1 mg via INTRAVENOUS
  Filled 2015-06-15: qty 1

## 2015-06-15 NOTE — ED Notes (Signed)
MD at bedside. 

## 2015-06-15 NOTE — ED Notes (Signed)
PT c/o recurrent abdominal pain LLQ with loose stools and vomiting intermittent x4 days starting again last night. PT very anxious and rolling around in the bed. MD at bedside at this time and wife present in room at this time.

## 2015-06-15 NOTE — ED Provider Notes (Signed)
CSN: [id number]     Arrival date & time 06/15/15  0725 History   First MD Initiated Contact with Patient 06/15/15 [phone]     Chief Complaint  Patient presents with  . Abdominal Pain     (Consider location/radiation/quality/duration/timing/severity/associated sxs/prior Treatment) HPI Comments:  The pt is a 54 y/o male - he has a hx of prior hemorrhoidectomy, no other abd surgery- was seen 2 days ago for severe pain in his abdomen - thought to be food poisoning - he had pain medicines, CT abd which was unremarkable as well as blood work that showed ketones in the urine but no other acute findings - he was better yesterday without pain until last night - thinks that after he ate last night he devloped recurrent severe pain - this is intermittent, colicky, severe at times, not associated with vomiting - has had some diarrhea - the pt is screaming in pain intermittently about every 2 minutes and at the same time rolls around on the bed.  Patient is a 54 y.o. male presenting with abdominal pain. The history is provided by the patient.  Abdominal Pain   History reviewed. No pertinent past medical history. Past Surgical History  Procedure Laterality Date  . Hemorroidectomy  2003  . Foot surgery Right     bone cutting  . Lipoma excision Right 11/08/2013    Procedure: EXCISION NEOPLASM RIGHT ARM;  Surgeon: [name], MD;  Location: [facility];  Service: General;  Laterality: Right;  . Mass excision Right 11/08/2013    Procedure: EXCISION NEOPLASM SCROTUM;  Surgeon: [name], MD;  Location: [facility];  Service: General;  Laterality: Right;   History reviewed. No pertinent family history. History  Substance Use Topics  . Smoking status: Current Every Day Smoker -- 0.50 packs/day for 3 years    Types: Cigarettes  . Smokeless tobacco: Not on file  . Alcohol Use: Yes     Comment: rarely    Review of Systems  Gastrointestinal: Positive for abdominal pain.  All other systems reviewed and are  negative.     Allergies  Review of patient's allergies indicates no known allergies.  Home Medications   Prior to Admission medications   Medication Sig Start Date End Date Taking? Authorizing Provider  aspirin EC 325 MG tablet Take 325 mg by mouth daily.   Yes Historical Provider, MD  Fish Oil-Cholecalciferol (FISH OIL + D3 PO) Take 1 capsule by mouth daily.   Yes Historical Provider, MD  ibuprofen (ADVIL,MOTRIN) 200 MG tablet Take 800 mg by mouth every 6 (six) hours as needed for moderate pain. pain   Yes Historical Provider, MD  vitamin C (ASCORBIC ACID) 500 MG tablet Take 2,000-3,000 mg by mouth daily.    Yes Historical Provider, MD  dicyclomine (BENTYL) 20 MG tablet Take 1 tablet (20 mg total) by mouth every 6 (six) hours as needed for spasms. 06/13/15   Orpah Greek, MD  doxycycline (VIBRAMYCIN) 100 MG capsule Take 1 capsule (100 mg total) by mouth 2 (two) times daily. Patient not taking: Reported on 06/13/2015 11/08/13   Aviva Signs Md, MD  HYDROcodone-acetaminophen (NORCO/VICODIN) 5-325 MG per tablet Take 2 tablets by mouth every 4 (four) hours as needed for moderate pain. 06/13/15   Orpah Greek, MD  ondansetron (ZOFRAN) 4 MG tablet Take 1 tablet (4 mg total) by mouth every 6 (six) hours. 06/13/15   Orpah Greek, MD  ranitidine (ZANTAC) 150 MG tablet Take 1 tablet (150 mg  total) by mouth 2 (two) times daily. 06/13/15   Orpah Greek, MD   BP 140/84 mmHg  Pulse 64  Resp 16  Ht 5\' 10"  (1.778 m)  Wt 228 lb (103.42 kg)  BMI 32.71 kg/m2  SpO2 94% Physical Exam  Constitutional: He appears well-developed and well-nourished. He appears distressed.  HENT:  Head: Normocephalic and atraumatic.  Mouth/Throat: Oropharynx is clear and moist. No oropharyngeal exudate.  Eyes: Conjunctivae and EOM are normal. Pupils are equal, round, and reactive to light. Right eye exhibits no discharge. Left eye exhibits no discharge. No scleral icterus.  Neck: Normal  range of motion. Neck supple. No JVD present. No thyromegaly present.  Cardiovascular: Normal rate, regular rhythm, normal heart sounds and intact distal pulses.  Exam reveals no gallop and no friction rub.   No murmur heard. Pulmonary/Chest: Effort normal and breath sounds normal. No respiratory distress. He has no wheezes. He has no rales.  Abdominal: Soft. Bowel sounds are normal. He exhibits no distension and no mass. There is no tenderness.  Soft and totally non tender abdomen - in fact he states it feels good to palpate the lower mid and LLQ abdomen.  Musculoskeletal: Normal range of motion. He exhibits no edema or tenderness.  Lymphadenopathy:    He has no cervical adenopathy.  Neurological: He is alert. Coordination normal.  Skin: Skin is warm and dry. No rash noted. No erythema.  Psychiatric: He has a normal mood and affect. His behavior is normal.  Nursing note and vitals reviewed.   ED Course  Procedures (including critical care time) Labs Review Labs Reviewed  CBC WITH DIFFERENTIAL/PLATELET - Abnormal; Notable for the following:    WBC 13.5 (*)    Neutrophils Relative % 80 (*)    Neutro Abs 10.8 (*)    All other components within normal limits  COMPREHENSIVE METABOLIC PANEL - Abnormal; Notable for the following:    Glucose, Bld 137 (*)    All other components within normal limits  LIPASE, BLOOD  I-STAT CG4 LACTIC ACID, ED    Imaging Review Ct Abdomen Pelvis W Contrast  06/13/2015   CLINICAL DATA:  Abdominal pain, nausea and vomiting since last night.  EXAM: CT ABDOMEN AND PELVIS WITH CONTRAST  TECHNIQUE: Multidetector CT imaging of the abdomen and pelvis was performed using the standard protocol following bolus administration of intravenous contrast.  CONTRAST:  86mL OMNIPAQUE IOHEXOL 300 MG/ML SOLN, 16mL OMNIPAQUE IOHEXOL 300 MG/ML SOLN  COMPARISON:  05/21/2004.  FINDINGS: Interval probable tiny cyst in the right lobe of the liver, adjacent to the gallbladder. The  previously seen diffuse low density of the liver relative to the spleen is no longer demonstrated.  Multiple colonic diverticula without evidence of diverticulitis. Normal appearing and normally opacified appendix without evidence of appendicitis. No gastric or small bowel abnormalities.  Small left inguinal hernia containing fat. Unremarkable spleen, pancreas, gallbladder, right adrenal gland, kidneys, urinary bladder and prostate gland.  Interval oval, low density left adrenal mass, measuring 2.6 x 2.2 cm on image number 30. This measures 22 Hounsfield units in density on image number 29.  No enlarged lymph nodes. No free peritoneal fluid or air. Interval minimal linear atelectasis or scarring at the right lung base. The previously seen 10 mm right lower lobe nodule is no longer visualized. Minimal bilateral hip degenerative changes. Lumbar and lower thoracic spine degenerative changes.  IMPRESSION: 1. No acute abnormality. 2. Colonic diverticulosis. 3. Small left inguinal hernia containing fat. 4. Interval 2.6 cm probable  left adrenal adenoma. 5. The previously demonstrated 10 mm right lower lobe nodule is no longer seen. 6. Resolved hepatic steatosis. 7. Interval probable tiny right lobe liver cyst.   Electronically Signed   By: Claudie Revering M.D.   On: 06/13/2015 16:36     EKG Interpretation None      MDM   Final diagnoses:  Generalized abdominal pain    While he appears intermittently in pain - it is a colicky type pain that seems to last less than 10 seconds.  He rolls on the bed and screams which he states helps - then it goes away.  No findings of abd ttp to guide another w/u - will repeat labs, hydrate and give pain meds - wife states that they did not fill the meds they were give because he had improved yesterday.    He was given Rx for  Vicodin Bentyl Zofran Zantac  Reevaluated at 9:00 AM, nontender abdomen, no complaints, labs reviewed slight leukocytosis, slight elevation in lactic  acid, in the absence of any abdominal discomfort or tenderness to palpation and with normal heart sounds, pulse, pressure and other vital signs this is likely related to his symptoms this morning and not related to a surgical problem. Discussed findings with the patient, discussed indications for return, expressed his understanding.  Gordon Chapel, MD 06/15/15 [phone]

## 2015-06-15 NOTE — Discharge Instructions (Signed)
Please use the medications that you were prescribed her last visit. Return to the hospital for any worsening symptoms.  Please call your doctor for a followup appointment within 24-48 hours. When you talk to your doctor please let them know that you were seen in the emergency department and have them acquire all of your records so that they can discuss the findings with you and formulate a treatment plan to fully care for your new and ongoing problems.

## 2015-10-09 ENCOUNTER — Emergency Department (HOSPITAL_COMMUNITY): Payer: 59

## 2015-10-09 ENCOUNTER — Encounter (HOSPITAL_COMMUNITY): Payer: Self-pay | Admitting: Emergency Medicine

## 2015-10-09 ENCOUNTER — Emergency Department (HOSPITAL_COMMUNITY)
Admission: EM | Admit: 2015-10-09 | Discharge: 2015-10-09 | Discharge status: Against Medical Advice | Payer: 59 | Attending: Emergency Medicine | Admitting: Emergency Medicine

## 2015-10-09 ENCOUNTER — Encounter (HOSPITAL_COMMUNITY): Payer: Self-pay

## 2015-10-09 ENCOUNTER — Emergency Department (HOSPITAL_COMMUNITY)
Admission: EM | Admit: 2015-10-09 | Discharge: 2015-10-09 | Disposition: A | Discharge status: Home / Self Care | Payer: 59 | Attending: Internal Medicine | Admitting: Internal Medicine

## 2015-10-09 ENCOUNTER — Encounter (HOSPITAL_COMMUNITY)
Admission: EM | Disposition: A | Discharge status: Home / Self Care | Payer: Self-pay | Source: Home / Self Care | Attending: Emergency Medicine

## 2015-10-09 DIAGNOSIS — Z79899 Other long term (current) drug therapy: Secondary | ICD-10-CM | POA: Insufficient documentation

## 2015-10-09 DIAGNOSIS — Z720 Tobacco use: Secondary | ICD-10-CM | POA: Insufficient documentation

## 2015-10-09 DIAGNOSIS — Z79891 Long term (current) use of opiate analgesic: Secondary | ICD-10-CM | POA: Diagnosis not present

## 2015-10-09 DIAGNOSIS — K2980 Duodenitis without bleeding: Secondary | ICD-10-CM | POA: Diagnosis not present

## 2015-10-09 DIAGNOSIS — Z7982 Long term (current) use of aspirin: Secondary | ICD-10-CM | POA: Diagnosis not present

## 2015-10-09 DIAGNOSIS — K2990 Gastroduodenitis, unspecified, without bleeding: Secondary | ICD-10-CM | POA: Diagnosis not present

## 2015-10-09 DIAGNOSIS — R1033 Periumbilical pain: Secondary | ICD-10-CM | POA: Diagnosis not present

## 2015-10-09 DIAGNOSIS — F17210 Nicotine dependence, cigarettes, uncomplicated: Secondary | ICD-10-CM | POA: Insufficient documentation

## 2015-10-09 DIAGNOSIS — R61 Generalized hyperhidrosis: Secondary | ICD-10-CM | POA: Insufficient documentation

## 2015-10-09 DIAGNOSIS — R109 Unspecified abdominal pain: Secondary | ICD-10-CM | POA: Insufficient documentation

## 2015-10-09 DIAGNOSIS — R112 Nausea with vomiting, unspecified: Secondary | ICD-10-CM | POA: Diagnosis not present

## 2015-10-09 HISTORY — PX: ESOPHAGOGASTRODUODENOSCOPY: SHX5428

## 2015-10-09 LAB — URINALYSIS, ROUTINE W REFLEX MICROSCOPIC
Bilirubin Urine: NEGATIVE
GLUCOSE, UA: NEGATIVE mg/dL
Hgb urine dipstick: NEGATIVE
LEUKOCYTES UA: NEGATIVE
NITRITE: NEGATIVE
Specific Gravity, Urine: 1.015 (ref 1.005–1.030)
UROBILINOGEN UA: 0.2 mg/dL (ref 0.0–1.0)
pH: 8 (ref 5.0–8.0)

## 2015-10-09 LAB — COMPREHENSIVE METABOLIC PANEL
ALT: 30 U/L (ref 17–63)
AST: 27 U/L (ref 15–41)
Albumin: 4.7 g/dL (ref 3.5–5.0)
Alkaline Phosphatase: 49 U/L (ref 38–126)
Anion gap: 12 (ref 5–15)
BUN: 17 mg/dL (ref 6–20)
CHLORIDE: 105 mmol/L (ref 101–111)
CO2: 24 mmol/L (ref 22–32)
CREATININE: 0.95 mg/dL (ref 0.61–1.24)
Calcium: 10.3 mg/dL (ref 8.9–10.3)
Glucose, Bld: 178 mg/dL — ABNORMAL HIGH (ref 65–99)
POTASSIUM: 4.1 mmol/L (ref 3.5–5.1)
SODIUM: 141 mmol/L (ref 135–145)
Total Bilirubin: 0.9 mg/dL (ref 0.3–1.2)
Total Protein: 8.1 g/dL (ref 6.5–8.1)

## 2015-10-09 LAB — CBC WITH DIFFERENTIAL/PLATELET
BASOS ABS: 0 10*3/uL (ref 0.0–0.1)
Basophils Relative: 0 %
EOS ABS: 0.1 10*3/uL (ref 0.0–0.7)
EOS PCT: 1 %
HCT: 47.7 % (ref 39.0–52.0)
Hemoglobin: 16.2 g/dL (ref 13.0–17.0)
LYMPHS ABS: 1.2 10*3/uL (ref 0.7–4.0)
LYMPHS PCT: 11 %
MCH: 30.7 pg (ref 26.0–34.0)
MCHC: 34 g/dL (ref 30.0–36.0)
MCV: 90.3 fL (ref 78.0–100.0)
Monocytes Absolute: 0.3 10*3/uL (ref 0.1–1.0)
Monocytes Relative: 3 %
NEUTROS PCT: 85 %
Neutro Abs: 9 10*3/uL — ABNORMAL HIGH (ref 1.7–7.7)
PLATELETS: 230 10*3/uL (ref 150–400)
RBC: 5.28 MIL/uL (ref 4.22–5.81)
RDW: 13.2 % (ref 11.5–15.5)
WBC: 10.6 10*3/uL — AB (ref 4.0–10.5)

## 2015-10-09 LAB — URINE MICROSCOPIC-ADD ON

## 2015-10-09 LAB — LIPASE, BLOOD: LIPASE: 31 U/L (ref 22–51)

## 2015-10-09 SURGERY — EGD (ESOPHAGOGASTRODUODENOSCOPY)
Anesthesia: Moderate Sedation

## 2015-10-09 MED ORDER — SODIUM CHLORIDE 0.9 % IV BOLUS (SEPSIS)
1000.0000 mL | Freq: Once | INTRAVENOUS | Status: AC
  Administered 2015-10-09: 1000 mL via INTRAVENOUS

## 2015-10-09 MED ORDER — HYDROMORPHONE HCL 1 MG/ML IJ SOLN
1.0000 mg | Freq: Once | INTRAMUSCULAR | Status: AC
  Administered 2015-10-09: 1 mg via INTRAVENOUS
  Filled 2015-10-09: qty 1

## 2015-10-09 MED ORDER — ASPIRIN EC 81 MG PO TBEC: 81.0000 mg | DELAYED_RELEASE_TABLET | Freq: Every day | ORAL | Status: DC

## 2015-10-09 MED ORDER — PANTOPRAZOLE SODIUM 40 MG PO TBEC: 40.0000 mg | DELAYED_RELEASE_TABLET | Freq: Every day | ORAL | Status: DC

## 2015-10-09 MED ORDER — IOHEXOL 300 MG/ML  SOLN
25.0000 mL | Freq: Once | INTRAMUSCULAR | Status: AC | PRN
  Administered 2015-10-09: 25 mL via ORAL

## 2015-10-09 MED ORDER — MIDAZOLAM HCL 5 MG/5ML IJ SOLN
INTRAMUSCULAR | Status: AC
  Filled 2015-10-09: qty 10

## 2015-10-09 MED ORDER — HYDROMORPHONE HCL 1 MG/ML IJ SOLN
1.0000 mg | Freq: Once | INTRAMUSCULAR | Status: AC
Start: 2015-10-09 — End: 2015-10-09
  Administered 2015-10-09: 1 mg via INTRAVENOUS
  Filled 2015-10-09: qty 1

## 2015-10-09 MED ORDER — SODIUM CHLORIDE 0.9 % IV SOLN
INTRAVENOUS | Status: DC
  Administered 2015-10-09: 1000 mL via INTRAVENOUS

## 2015-10-09 MED ORDER — IOHEXOL 300 MG/ML  SOLN
100.0000 mL | Freq: Once | INTRAMUSCULAR | Status: AC | PRN
  Administered 2015-10-09: 100 mL via INTRAVENOUS

## 2015-10-09 MED ORDER — MIDAZOLAM HCL 5 MG/5ML IJ SOLN
INTRAMUSCULAR | Status: DC | PRN
  Administered 2015-10-09: 2 mg via INTRAVENOUS
  Administered 2015-10-09: 3 mg via INTRAVENOUS
  Administered 2015-10-09: 2 mg via INTRAVENOUS
  Administered 2015-10-09 (×2): 3 mg via INTRAVENOUS

## 2015-10-09 MED ORDER — SIMETHICONE 40 MG/0.6ML PO SUSP
ORAL | Status: DC | PRN
  Administered 2015-10-09: 15:00:00

## 2015-10-09 MED ORDER — BUTAMBEN-TETRACAINE-BENZOCAINE 2-2-14 % EX AERO
INHALATION_SPRAY | CUTANEOUS | Status: DC | PRN
  Administered 2015-10-09: 2 via TOPICAL

## 2015-10-09 MED ORDER — MEPERIDINE HCL 50 MG/ML IJ SOLN
INTRAMUSCULAR | Status: AC
  Filled 2015-10-09: qty 1

## 2015-10-09 MED ORDER — MIDAZOLAM HCL 5 MG/5ML IJ SOLN
INTRAMUSCULAR | Status: AC
  Filled 2015-10-09: qty 5

## 2015-10-09 MED ORDER — ONDANSETRON HCL 4 MG/2ML IJ SOLN
4.0000 mg | Freq: Once | INTRAMUSCULAR | Status: AC
  Administered 2015-10-09: 4 mg via INTRAVENOUS
  Filled 2015-10-09: qty 2

## 2015-10-09 MED ORDER — MEPERIDINE HCL 50 MG/ML IJ SOLN
INTRAMUSCULAR | Status: DC | PRN
  Administered 2015-10-09 (×2): 25 mg via INTRAVENOUS

## 2015-10-09 NOTE — ED Provider Notes (Signed)
CSN: [id number]     Arrival date & time 10/09/15  6314 History  By signing my name below, I, Gordon Richards, attest that this documentation has been prepared under the direction and in the presence of Nat Christen, MD. Electronically Signed: Terressa Richards, ED Scribe. 10/09/2015. 9:07 AM.  Chief Complaint  Patient presents with  . Abdominal Pain   The history is provided by the patient. No language interpreter was used.    PCP: No PCP Per Patient HPI Comments: Gordon Richards is a 54 y.o. male, with PMHx noted below including 130lbs weight loss, who presents to the Emergency Department complaining of recurrent, non-radiating, severe periumbilical pain onset several hours ago. Pt reports episode of similar Sx in June whereby he presented to the ED and a CT scan was completed which was unremarkable. Pt reports he was also evaluated for the same by a surgeon resulting in an unknown Dx. Pt denies vomiting, diarrhea, Hx of abd surgery.    History reviewed. No pertinent past medical history. Past Surgical History  Procedure Laterality Date  . Hemorroidectomy  2003  . Foot surgery Right     bone cutting  . Lipoma excision Right 11/08/2013    Procedure: EXCISION NEOPLASM RIGHT ARM;  Surgeon: [name], MD;  Location: [facility];  Service: General;  Laterality: Right;  . Mass excision Right 11/08/2013    Procedure: EXCISION NEOPLASM SCROTUM;  Surgeon: [name], MD;  Location: [facility];  Service: General;  Laterality: Right;   History reviewed. No pertinent family history. Social History  Substance Use Topics  . Smoking status: Current Every Day Smoker -- 0.50 packs/day for 3 years    Types: Cigarettes  . Smokeless tobacco: None  . Alcohol Use: Yes     Comment: rarely    Review of Systems  Gastrointestinal: Positive for abdominal pain. Negative for vomiting and diarrhea.    A complete 10 system review of systems was obtained and all systems are negative except as noted in the HPI and PMH.    Allergies  Review of patient's allergies indicates no known allergies.  Home Medications   Prior to Admission medications   Medication Sig Start Date End Date Taking? Authorizing Provider  alum & mag hydroxide-simeth (MAALOX/MYLANTA) 200-200-20 MG/5ML suspension Take 30 mLs by mouth every 6 (six) hours as needed for indigestion or heartburn.   Yes Historical Provider, MD  aspirin EC 325 MG tablet Take 325 mg by mouth daily.   Yes Historical Provider, MD  Fish Oil-Cholecalciferol (FISH OIL + D3 PO) Take 1 capsule by mouth daily.   Yes Historical Provider, MD  HYDROcodone-acetaminophen (NORCO/VICODIN) 5-325 MG per tablet Take 2 tablets by mouth every 4 (four) hours as needed for moderate pain. 06/13/15  Yes Orpah Greek, MD  TURMERIC PO Take 2 tablets by mouth daily.   Yes Historical Provider, MD  vitamin C (ASCORBIC ACID) 500 MG tablet Take 1,000 mg by mouth daily.    Yes Historical Provider, MD  dicyclomine (BENTYL) 20 MG tablet Take 1 tablet (20 mg total) by mouth every 6 (six) hours as needed for spasms. Patient not taking: Reported on 10/09/2015 06/13/15   Orpah Greek, MD  ranitidine (ZANTAC) 150 MG tablet Take 1 tablet (150 mg total) by mouth 2 (two) times daily. Patient not taking: Reported on 10/09/2015 06/13/15   Orpah Greek, MD   Triage Vitals: Temp(Src) 98.4 F (36.9 C) (Oral)  Ht 5\' 10"  (1.778 m)  Wt 213 lb (  96.616 kg)  BMI 30.56 kg/m2 Physical Exam  Constitutional: He is oriented to person, place, and time. He appears well-developed and well-nourished.  Diaphoretic and howling in severe pain   HENT:  Head: Normocephalic and atraumatic.  Eyes: Conjunctivae and EOM are normal. Pupils are equal, round, and reactive to light.  Neck: Normal range of motion. Neck supple.  Cardiovascular: Normal rate and regular rhythm.   Pulmonary/Chest: Effort normal and breath sounds normal.  Abdominal: Soft. Bowel sounds are normal. There is tenderness in the  periumbilical area.  Musculoskeletal: Normal range of motion.  Neurological: He is alert and oriented to person, place, and time.  Skin: Skin is warm and dry.  Psychiatric: He has a normal mood and affect. His behavior is normal.  Nursing note and vitals reviewed.  ED Course  Procedures (including critical care time) DIAGNOSTIC STUDIES: Oxygen Saturation is 98% on RA, nl by my interpretation.    COORDINATION OF CARE: 8:59 AM: Discussed treatment plan which includes meds, imaging and labs with pt at bedside; patient verbalizes understanding and agrees with treatment plan. 11:02 AM: Recheck, pt states he is feeling much better. Discussed lab and imaging results with pt.   Labs Review Labs Reviewed  CBC WITH DIFFERENTIAL/PLATELET - Abnormal; Notable for the following:    WBC 10.6 (*)    Neutro Abs 9.0 (*)    All other components within normal limits  COMPREHENSIVE METABOLIC PANEL - Abnormal; Notable for the following:    Glucose, Bld 178 (*)    All other components within normal limits  URINALYSIS, ROUTINE W REFLEX MICROSCOPIC (NOT AT [facility]) - Abnormal; Notable for the following:    Ketones, ur >80 (*)    Protein, ur TRACE (*)    All other components within normal limits  LIPASE, BLOOD  URINE MICROSCOPIC-ADD ON   Imaging Review Ct Abdomen Pelvis W Contrast  10/09/2015  CLINICAL DATA:  Recurrent non radiating severe periumbilical pain, onset several hours ago. Several similar episodes in June. EXAM: CT ABDOMEN AND PELVIS WITH CONTRAST TECHNIQUE: Multidetector CT imaging of the abdomen and pelvis was performed using the standard protocol following bolus administration of intravenous contrast. CONTRAST:  17mL OMNIPAQUE IOHEXOL 300 MG/ML SOLN, 164mL OMNIPAQUE IOHEXOL 300 MG/ML SOLN COMPARISON:  June 13, 2015 FINDINGS: Lung bases are clear.  No effusions.  Heart is normal size. Liver, gallbladder, spleen, pancreas, right adrenal, kidneys have an unremarkable unenhanced appearance. Stable  left adrenal nodule. This measures 11 Hounsfield units on delayed imaging compatible with adenoma. There is sigmoid diverticulosis. No active diverticulitis. Appendix is visualized and is normal. No periumbilical abnormality. No ventral wall hernia. Stomach and small bowel are unremarkable. No free fluid, free air or adenopathy. Aorta is normal caliber. Small left inguinal hernia again noted, containing fat, unchanged. No acute bony abnormality or focal bone lesion. IMPRESSION: Left colonic diverticulosis.  No active diverticulitis. No acute findings. Electronically Signed   By: Rolm Baptise M.D.   On: 10/09/2015 10:47   I have personally reviewed and evaluated these images and lab results as part of my medical decision-making. CRITICAL CARE Performed by: Nat Christen Total critical care time: 30 Critical care time was exclusive of separately billable procedures and treating other patients. Critical care was necessary to treat or prevent imminent or life-threatening deterioration. Critical care was time spent personally by me on the following activities: development of treatment plan with patient and/or surrogate as well as nursing, discussions with consultants, evaluation of patient's response to treatment, examination of patient, obtaining  history from patient or surrogate, ordering and performing treatments and interventions, ordering and review of laboratory studies, ordering and review of radiographic studies, pulse oximetry and re-evaluation of patient's condition. MDM   Final diagnoses:  Abdominal pain, unspecified abdominal location   Patient presents with severe abdominal pain and diaphoresis. CT scan shows diverticulosis but no diverticulitis. His pain is improved with IV hydration and pain management. Discussed with Dr. Laural Golden. He will perform EGD.   I, Orlean Holtrop, personally performed the services described in this documentation. All medical record entries made by the scribe were at my  direction and in my presence.  I have reviewed the chart and discharge instructions and agree that the record reflects my personal performance and is accurate and complete. Kally Cadden.  10/09/2015. 1:01 PM.     Nat Christen, MD 10/09/15 1304

## 2015-10-09 NOTE — ED Notes (Signed)
Pt was witnessed by tech "downing a bottle of water" Went into room and explained to pt that because he was here with abdominal pain, he could not be drinking anything or have anything by mouth. Pt then began yelling and cursing me. I explained that his language was not acceptable and he continued to scream. MD to bedside. Security to bedside. Care for pt has been transferred to [facility], [place]

## 2015-10-09 NOTE — Op Note (Signed)
EGD PROCEDURE REPORT  PATIENT:  Gordon Richards  MR#:  [id number] Birthdate:  Apr 16, 1961, 54 y.o., male Endoscopist:  Dr. [name], MD  Procedure Date: 10/09/2015  Procedure:   EGD  Indications:  Patient is 54 year old Caucasian male who was evaluated in the emergency room earlier today with third episode of severe midabdominal pain associated with nausea and vomiting. Lab studies unremarkable except for present of ketones in urine and abdominal CT does not show any abnormality to account for his pain. Patient is on full does aspirin and he also has been taking ibuprofen until about a week ago. He had 2 other episodes of pain in July this year. He is undergoing diagnostic EGD.            Informed Consent:  The risks, benefits, alternatives & imponderables which include, but are not limited to, bleeding, infection, perforation, drug reaction and potential missed lesion have been reviewed.  The potential for biopsy, lesion removal, esophageal dilation, etc. have also been discussed.  Questions have been answered.  All parties agreeable.  Please see history & physical in medical record for more information.  Medications:  Demerol 50 mg IV Versed 13 mg IV Cetacaine spray topically for oropharyngeal anesthesia  Description of procedure:  The endoscope was introduced through the mouth and advanced to the second portion of the duodenum without difficulty or limitations. The mucosal surfaces were surveyed very carefully during advancement of the scope and upon withdrawal.  Findings:  Esophagus:  Mucosa of the esophagus was normal. GE junction was unremarkable. Stomach:  Stomach was empty and distended very well with insufflation. Folds in the proximal stomach were normal. Mucosa at gastric body was normal. Multiple antral erosions noted including 2 prepyloric erosions with mucosal edema. Pyloric channel however was patent. Angularis was unremarkable. Duodenum:  Patchy bulbar erythema edema  along with few erosions. Normal post bulbar mucosa.  Therapeutic/Diagnostic Maneuvers Performed:  None  Complications:  None  EBL: None  Impression: Erosive gastroduodenitis possibly secondary to aspirin use. No evidence of peptic ulcer disease.  Comment: These findings would not explain patient's intractable pain. Patient's dramatic symptoms suggesting may have intermittent small bowel obstruction or intussusception.  Recommendations:  Decrease aspirin dose to 81 mg by mouth daily. H. pylori serology. Pantoprazole 40 mg by mouth every morning. Will schedule patient for colonoscopy in the future. If patient has another episode of severe pain unresponsive to by mouth medication he should report to emergency room.  REHMAN,NAJEEB U  10/09/2015  3:30 PM  CC: Dr. Rayne Du PCP Per Patient & Dr. No ref. provider found

## 2015-10-09 NOTE — Progress Notes (Signed)
Pt with IV in right hand; 22 gauge infusing with 0.9 Sodium Chloride;  IV site WNL upon arrival to Endo Room 1.  There is no IV in left AC upon arrival to Endo room 1

## 2015-10-09 NOTE — H&P (Signed)
Gordon Richards is an 54 y.o. male.   Chief Complaint: Patient is here for EGD. HPI: Patient is 54 year old Caucasian male who presents with acute onset of midabdominal pain associated nausea and vomiting. First episode occurred in July 2016 when he was evaluated in emergency room and treated with Dilaudid for relief of pain. He another episode 2 days later and now comes back for this pain described to be severe associated with nausea and vomiting. He was seen in emergency room. Lab studies 100 remarkable except urine was positive for ketones. Serum lipase LFTs from Calcitrol normal. Abdominopelvic CT does not reveal any source of his symptoms. Therefore EGD was advised because he has been on ibuprofen until 1 week ago and also takes low-dose aspirin. He denies melena or rectal bleeding. No or undergone abdominal surgery. Drinks alcohol occasionally but hasn't had any in 3 months. States he has lost 140 pounds in last 2 years but only 35 pounds this year. His goal is to lose another 40 pounds.  History reviewed. No pertinent past medical history.  Past Surgical History  Procedure Laterality Date  . Hemorroidectomy  2003  . Foot surgery Right     bone cutting  . Lipoma excision Right 11/08/2013    Procedure: EXCISION NEOPLASM RIGHT ARM;  Surgeon: [name], MD;  Location: [facility];  Service: General;  Laterality: Right;  . Mass excision Right 11/08/2013    Procedure: EXCISION NEOPLASM SCROTUM;  Surgeon: [name], MD;  Location: [facility];  Service: General;  Laterality: Right;    History reviewed. No pertinent family history. Social History:  reports that he has been smoking Cigarettes.  He has a 5 pack-year smoking history. He does not have any smokeless tobacco history on file. He reports that he drinks alcohol. He reports that he does not use illicit drugs.  Allergies: No Known Allergies   (Not in a hospital admission)  Results for orders placed or performed during the hospital  encounter of 10/09/15 (from the past 48 hour(s))  CBC with Differential     Status: Abnormal   Collection Time: 10/09/15  9:03 AM  Result Value Ref Range   WBC 10.6 (H) 4.0 - 10.5 K/uL   RBC 5.28 4.22 - 5.81 MIL/uL   Hemoglobin 16.2 13.0 - 17.0 g/dL   HCT 47.7 39.0 - 52.0 %   MCV 90.3 78.0 - 100.0 fL   MCH 30.7 26.0 - 34.0 pg   MCHC 34.0 30.0 - 36.0 g/dL   RDW 13.2 11.5 - 15.5 %   Platelets 230 150 - 400 K/uL   Neutrophils Relative % 85 %   Neutro Abs 9.0 (H) 1.7 - 7.7 K/uL   Lymphocytes Relative 11 %   Lymphs Abs 1.2 0.7 - 4.0 K/uL   Monocytes Relative 3 %   Monocytes Absolute 0.3 0.1 - 1.0 K/uL   Eosinophils Relative 1 %   Eosinophils Absolute 0.1 0.0 - 0.7 K/uL   Basophils Relative 0 %   Basophils Absolute 0.0 0.0 - 0.1 K/uL  Comprehensive metabolic panel     Status: Abnormal   Collection Time: 10/09/15  9:03 AM  Result Value Ref Range   Sodium 141 135 - 145 mmol/L   Potassium 4.1 3.5 - 5.1 mmol/L   Chloride 105 101 - 111 mmol/L   CO2 24 22 - 32 mmol/L   Glucose, Bld 178 (H) 65 - 99 mg/dL   BUN 17 6 - 20 mg/dL   Creatinine, Ser 0.95 0.61 -  1.24 mg/dL   Calcium 10.3 8.9 - 10.3 mg/dL   Total Protein 8.1 6.5 - 8.1 g/dL   Albumin 4.7 3.5 - 5.0 g/dL   AST 27 15 - 41 U/L   ALT 30 17 - 63 U/L   Alkaline Phosphatase 49 38 - 126 U/L   Total Bilirubin 0.9 0.3 - 1.2 mg/dL   GFR calc non Af Amer >60 >60 mL/min   GFR calc Af Amer >60 >60 mL/min    Comment: (NOTE) The eGFR has been calculated using the CKD EPI equation. This calculation has not been validated in all clinical situations. eGFR's persistently <60 mL/min signify possible Chronic Kidney Disease.    Anion gap 12 5 - 15  Lipase, blood     Status: None   Collection Time: 10/09/15  9:03 AM  Result Value Ref Range   Lipase 31 22 - 51 U/L  Urinalysis, Routine w reflex microscopic     Status: Abnormal   Collection Time: 10/09/15 10:42 AM  Result Value Ref Range   Color, Urine YELLOW YELLOW   APPearance CLEAR CLEAR    Specific Gravity, Urine 1.015 1.005 - 1.030   pH 8.0 5.0 - 8.0   Glucose, UA NEGATIVE NEGATIVE mg/dL   Hgb urine dipstick NEGATIVE NEGATIVE   Bilirubin Urine NEGATIVE NEGATIVE   Ketones, ur >80 (A) NEGATIVE mg/dL   Protein, ur TRACE (A) NEGATIVE mg/dL   Urobilinogen, UA 0.2 0.0 - 1.0 mg/dL   Nitrite NEGATIVE NEGATIVE   Leukocytes, UA NEGATIVE NEGATIVE  Urine microscopic-add on     Status: None   Collection Time: 10/09/15 10:42 AM  Result Value Ref Range   RBC / HPF 0-2 <3 RBC/hpf   Urine-Other MUCOUS PRESENT    Ct Abdomen Pelvis W Contrast  10/09/2015  CLINICAL DATA:  Recurrent non radiating severe periumbilical pain, onset several hours ago. Several similar episodes in June. EXAM: CT ABDOMEN AND PELVIS WITH CONTRAST TECHNIQUE: Multidetector CT imaging of the abdomen and pelvis was performed using the standard protocol following bolus administration of intravenous contrast. CONTRAST:  27m OMNIPAQUE IOHEXOL 300 MG/ML SOLN, 1032mOMNIPAQUE IOHEXOL 300 MG/ML SOLN COMPARISON:  June 13, 2015 FINDINGS: Lung bases are clear.  No effusions.  Heart is normal size. Liver, gallbladder, spleen, pancreas, right adrenal, kidneys have an unremarkable unenhanced appearance. Stable left adrenal nodule. This measures 11 Hounsfield units on delayed imaging compatible with adenoma. There is sigmoid diverticulosis. No active diverticulitis. Appendix is visualized and is normal. No periumbilical abnormality. No ventral wall hernia. Stomach and small bowel are unremarkable. No free fluid, free air or adenopathy. Aorta is normal caliber. Small left inguinal hernia again noted, containing fat, unchanged. No acute bony abnormality or focal bone lesion. IMPRESSION: Left colonic diverticulosis.  No active diverticulitis. No acute findings. Electronically Signed   By: KeRolm Baptise.D.   On: 10/09/2015 10:47    ROS  Blood pressure 139/91, pulse 71, temperature 98.4 F (36.9 C), temperature source Oral, resp. rate 15,  height _0  (1.778 m), weight 213 lb (96.616 kg), SpO2 100 %. Physical Exam  Constitutional: He appears well-developed and well-nourished.  HENT:  Mouth/Throat: Oropharynx is clear and moist.  Eyes: Conjunctivae are normal. No scleral icterus.  Neck: No thyromegaly present.  Cardiovascular: Normal rate, regular rhythm and normal heart sounds.   No murmur heard. Respiratory: Effort normal and breath sounds normal.  GI: Soft. He exhibits no distension and no mass. There is no tenderness.  Musculoskeletal: He exhibits no edema.  Lymphadenopathy:    He has no cervical adenopathy.  Neurological: He is alert.  Skin: Skin is warm and dry.     Assessment/Plan Midabdominal pain with nausea and vomiting. Diagnostic EGD.  Kasheena Sambrano U 10/09/2015, 2:36 PM

## 2015-10-09 NOTE — ED Notes (Signed)
Having left central abdominal pain since 2 am.  Rates pain 10/10.

## 2015-10-09 NOTE — ED Notes (Signed)
After this nurse finished the triage of this patient, the patient started yelling loudly "If I don't get pain medicine right now I'm not staying here"   Pt was heard throughout the department and was yelling as loud as possible.   This nurse returned to the room to check on patient and inform him that his nurse would be in to see him as soon as possible to get an IV started, and then the doctor would be in.   Pt stood up and stormed out of the room and down the hallway of the e.d. yelling extremely loud as he walked.

## 2015-10-09 NOTE — Discharge Instructions (Signed)
Decrease aspirin to 81 mg by mouth daily Resume other medications as before. Pantoprazole 40 mg by mouth 30 minutes before breakfast daily can take first dose this evening. Resume usual diet. Return to emergency room for abdominal pain not relieved with pain medication. Colonoscopy to be scheduled. No driving for 24 hours.

## 2015-10-09 NOTE — ED Notes (Signed)
Pt c/o lower abd pain that started approx 2 am.  Pt states he had this episode 5 months ago and dilaudid was the only thing that helped the pain

## 2015-10-12 ENCOUNTER — Other Ambulatory Visit (INDEPENDENT_AMBULATORY_CARE_PROVIDER_SITE_OTHER): Payer: Self-pay | Admitting: Internal Medicine

## 2015-10-12 DIAGNOSIS — R109 Unspecified abdominal pain: Secondary | ICD-10-CM

## 2015-10-12 DIAGNOSIS — R112 Nausea with vomiting, unspecified: Secondary | ICD-10-CM

## 2015-10-12 LAB — H. PYLORI ANTIBODY, IGG

## 2015-10-16 ENCOUNTER — Ambulatory Visit (HOSPITAL_COMMUNITY)
Admission: RE | Admit: 2015-10-16 | Discharge: 2015-10-16 | Disposition: A | Discharge status: Home / Self Care | Payer: 59 | Source: Ambulatory Visit | Attending: Internal Medicine | Admitting: Internal Medicine

## 2015-10-16 DIAGNOSIS — R109 Unspecified abdominal pain: Secondary | ICD-10-CM | POA: Insufficient documentation

## 2015-10-16 DIAGNOSIS — R112 Nausea with vomiting, unspecified: Secondary | ICD-10-CM

## 2015-10-19 ENCOUNTER — Encounter (HOSPITAL_COMMUNITY): Payer: Self-pay | Admitting: Internal Medicine

## 2015-10-20 ENCOUNTER — Emergency Department (HOSPITAL_COMMUNITY)
Admission: EM | Admit: 2015-10-20 | Discharge: 2015-10-21 | Disposition: A | Discharge status: Home / Self Care | Payer: 59 | Attending: Emergency Medicine | Admitting: Emergency Medicine

## 2015-10-20 ENCOUNTER — Other Ambulatory Visit (INDEPENDENT_AMBULATORY_CARE_PROVIDER_SITE_OTHER): Payer: Self-pay | Admitting: Internal Medicine

## 2015-10-20 ENCOUNTER — Encounter (HOSPITAL_COMMUNITY): Payer: Self-pay

## 2015-10-20 ENCOUNTER — Encounter (INDEPENDENT_AMBULATORY_CARE_PROVIDER_SITE_OTHER): Payer: Self-pay | Admitting: *Deleted

## 2015-10-20 ENCOUNTER — Emergency Department (HOSPITAL_COMMUNITY): Payer: 59

## 2015-10-20 DIAGNOSIS — R112 Nausea with vomiting, unspecified: Secondary | ICD-10-CM | POA: Diagnosis not present

## 2015-10-20 DIAGNOSIS — F419 Anxiety disorder, unspecified: Secondary | ICD-10-CM | POA: Diagnosis not present

## 2015-10-20 DIAGNOSIS — R1033 Periumbilical pain: Secondary | ICD-10-CM | POA: Insufficient documentation

## 2015-10-20 DIAGNOSIS — Z7982 Long term (current) use of aspirin: Secondary | ICD-10-CM | POA: Diagnosis not present

## 2015-10-20 DIAGNOSIS — Z9889 Other specified postprocedural states: Secondary | ICD-10-CM | POA: Insufficient documentation

## 2015-10-20 DIAGNOSIS — R1030 Lower abdominal pain, unspecified: Secondary | ICD-10-CM

## 2015-10-20 DIAGNOSIS — Z79899 Other long term (current) drug therapy: Secondary | ICD-10-CM | POA: Diagnosis not present

## 2015-10-20 DIAGNOSIS — R109 Unspecified abdominal pain: Secondary | ICD-10-CM

## 2015-10-20 DIAGNOSIS — F1210 Cannabis abuse, uncomplicated: Secondary | ICD-10-CM | POA: Diagnosis not present

## 2015-10-20 DIAGNOSIS — F1110 Opioid abuse, uncomplicated: Secondary | ICD-10-CM | POA: Insufficient documentation

## 2015-10-20 DIAGNOSIS — Z720 Tobacco use: Secondary | ICD-10-CM | POA: Diagnosis not present

## 2015-10-20 LAB — CBC
HCT: 45.4 % (ref 39.0–52.0)
HEMOGLOBIN: 15.8 g/dL (ref 13.0–17.0)
MCH: 31.1 pg (ref 26.0–34.0)
MCHC: 34.8 g/dL (ref 30.0–36.0)
MCV: 89.4 fL (ref 78.0–100.0)
PLATELETS: 232 10*3/uL (ref 150–400)
RBC: 5.08 MIL/uL (ref 4.22–5.81)
RDW: 13.1 % (ref 11.5–15.5)
WBC: 16.6 10*3/uL — ABNORMAL HIGH (ref 4.0–10.5)

## 2015-10-20 LAB — URINALYSIS, ROUTINE W REFLEX MICROSCOPIC
Bilirubin Urine: NEGATIVE
GLUCOSE, UA: NEGATIVE mg/dL
HGB URINE DIPSTICK: NEGATIVE
KETONES UR: 40 mg/dL — AB
LEUKOCYTES UA: NEGATIVE
Nitrite: NEGATIVE
PROTEIN: NEGATIVE mg/dL
Specific Gravity, Urine: 1.02 (ref 1.005–1.030)
UROBILINOGEN UA: 0.2 mg/dL (ref 0.0–1.0)
pH: 7 (ref 5.0–8.0)

## 2015-10-20 LAB — COMPREHENSIVE METABOLIC PANEL
ALK PHOS: 50 U/L (ref 38–126)
ALT: 27 U/L (ref 17–63)
ANION GAP: 14 (ref 5–15)
AST: 23 U/L (ref 15–41)
Albumin: 4.5 g/dL (ref 3.5–5.0)
BILIRUBIN TOTAL: 0.8 mg/dL (ref 0.3–1.2)
BUN: 18 mg/dL (ref 6–20)
CALCIUM: 10.3 mg/dL (ref 8.9–10.3)
CO2: 24 mmol/L (ref 22–32)
CREATININE: 1.05 mg/dL (ref 0.61–1.24)
Chloride: 103 mmol/L (ref 101–111)
Glucose, Bld: 123 mg/dL — ABNORMAL HIGH (ref 65–99)
Potassium: 3.8 mmol/L (ref 3.5–5.1)
Sodium: 141 mmol/L (ref 135–145)
TOTAL PROTEIN: 7.6 g/dL (ref 6.5–8.1)

## 2015-10-20 LAB — LIPASE, BLOOD: LIPASE: 33 U/L (ref 11–51)

## 2015-10-20 MED ORDER — HYDROMORPHONE HCL 1 MG/ML IJ SOLN
1.0000 mg | Freq: Once | INTRAMUSCULAR | Status: AC
  Administered 2015-10-20: 1 mg via INTRAVENOUS
  Filled 2015-10-20: qty 1

## 2015-10-20 MED ORDER — PROMETHAZINE HCL 25 MG/ML IJ SOLN
12.5000 mg | Freq: Once | INTRAMUSCULAR | Status: AC
  Administered 2015-10-20: 12.5 mg via INTRAVENOUS
  Filled 2015-10-20: qty 1

## 2015-10-20 MED ORDER — SODIUM CHLORIDE 0.9 % IV SOLN
1000.0000 mL | Freq: Once | INTRAVENOUS | Status: AC
  Administered 2015-10-20: 1000 mL via INTRAVENOUS

## 2015-10-20 MED ORDER — SODIUM CHLORIDE 0.9 % IV SOLN: 1000.0000 mL | INTRAVENOUS | Status: DC

## 2015-10-20 MED ORDER — SODIUM CHLORIDE 0.9 % IV SOLN
1000.0000 mL | Freq: Once | INTRAVENOUS | Status: AC
  Administered 2015-10-21: 1000 mL via INTRAVENOUS

## 2015-10-20 NOTE — ED Notes (Signed)
EDp at bedside.  

## 2015-10-20 NOTE — ED Notes (Signed)
Patient pacing in room yelling "the pain policy WILL get changed at this hospital because of me" "things will get changed" Patient states "you see, since I got my pain medicine, I can talk to you like a normal person" Family member at bedside

## 2015-10-20 NOTE — ED Notes (Signed)
Patient c/o abdominal pain that started in July, patient states pain got worse tonight.

## 2015-10-20 NOTE — ED Notes (Signed)
Patient states "Once I get my dilaudid, I feel like a normal person again"

## 2015-10-20 NOTE — ED Provider Notes (Signed)
CSN: [id number]     Arrival date & time 10/20/15  2230 History  By signing my name below, I, Helane Gunther, attest that this documentation has been prepared under the direction and in the presence of Rolland Porter, MD at 2305. Electronically Signed: Helane Gunther, ED Scribe. 10/21/2015. 12:51 AM.     Chief Complaint  Patient presents with  . Abdominal Pain    The history is provided by the patient and the spouse. No language interpreter was used.   HPI Comments: Gordon Richards is a 54 y.o. male smoker at 0.5 ppd who presents to the Emergency Department complaining of intermittent waves of spasms, cramping LUQ abdominal pain that radiates towards his umbilicus onset 4 hours ago. Pt states that he felt nauseated after eating baked chicken and soup last night, and notes he ended up vomiting "a little bit." He notes he felt fine today during his usual activities, but that the nausea and vomiting suddenly came upon him about 4 hours ago. He notes no exacerbating or alleviating factors. He notes he was seen in the ED for the same on 04/08 and 10/14. Per wife, his gastroenterologist believes pt has small bowel obstructions and has a colonoscopy scheduled for 11/11. Pt notes he has had several diagnostic studies done, including endoscopy, 2 CAT scans, and a small bowel follow through to find out what is causing these episodes, but no diagnosis yet. He also states the only other new thing was  he was exposed to poison oak recently, but was not seen for this and only used OTC topical medications. He notes he has lost 143 lbs in the last year. He states he runs 2.5 miles every day and works out 3 times each week. He notes he usually eats only 2 meals a day. He was eating hamburgers in the evening but started eating chicken about a month ago. He denies drinking any alcohol in the last 4 months. Pt denies diarrhea and constipation.  GI Dr Laural Golden   History reviewed. No pertinent past medical history. Past Surgical  History  Procedure Laterality Date  . Hemorroidectomy  2003  . Foot surgery Right     bone cutting  . Lipoma excision Right 11/08/2013    Procedure: EXCISION NEOPLASM RIGHT ARM;  Surgeon: [name], MD;  Location: [facility];  Service: General;  Laterality: Right;  . Mass excision Right 11/08/2013    Procedure: EXCISION NEOPLASM SCROTUM;  Surgeon: [name], MD;  Location: [facility];  Service: General;  Laterality: Right;  . Esophagogastroduodenoscopy N/A 10/09/2015    Procedure: ESOPHAGOGASTRODUODENOSCOPY (EGD);  Surgeon: [name], MD;  Location: [facility];  Service: Endoscopy;  Laterality: N/A;   History reviewed. No pertinent family history. Social History  Substance Use Topics  . Smoking status: Current Every Day Smoker -- 1.00 packs/day for 5 years    Types: Cigarettes  . Smokeless tobacco: None  . 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 Negative for diarrhea and constipation.  All other systems reviewed and are negative.   Allergies  Review of patient's allergies indicates no known allergies.  Home Medications   Prior to Admission medications   Medication Sig Start Date End Date Taking? Authorizing Provider  aspirin EC 81 MG tablet Take 1 tablet (81 mg total) by mouth daily. 10/09/15  Yes Najeeb U  Rehman, MD  Fish Oil-Cholecalciferol (FISH OIL + D3 PO) Take 1 capsule by mouth daily.   Yes Historical Provider, MD  pantoprazole (PROTONIX) 40 MG tablet Take 1 tablet (40 mg total) by mouth daily before breakfast. 10/09/15  Yes [name], MD  TURMERIC PO Take 2 tablets by mouth daily.   Yes Historical Provider, MD  vitamin C (ASCORBIC ACID) 500 MG tablet Take 1,000 mg by mouth daily.    Yes Historical Provider, MD  dicyclomine (BENTYL) 20 MG tablet Take 1 tablet (20 mg total) by mouth every 6 (six) hours as  needed for spasms. Patient not taking: Reported on 10/09/2015 06/13/15   Orpah Greek, MD  HYDROcodone-acetaminophen (NORCO/VICODIN) 5-325 MG per tablet Take 2 tablets by mouth every 4 (four) hours as needed for moderate pain. Patient not taking: Reported on 10/20/2015 06/13/15   Orpah Greek, MD   BP 113/73 mmHg  Pulse 55  Temp(Src) 98.1 F (36.7 C) (Oral)  Resp 18  Ht 5\' 10"  (1.778 m)  Wt 209 lb (94.802 kg)  BMI 29.99 kg/m2  SpO2 91%  Vital signs normal   Physical Exam  Constitutional: He is oriented to person, place, and time. He appears well-developed and well-nourished.  Non-toxic appearance. He does not appear ill. He appears distressed.  HENT:  Head: Normocephalic and atraumatic.  Right Ear: External ear normal.  Left Ear: External ear normal.  Nose: Nose normal. No mucosal edema or rhinorrhea.  Mouth/Throat: Mucous membranes are normal. No dental abscesses or uvula swelling.  Very dry mouth  Eyes: Conjunctivae and EOM are normal. Pupils are equal, round, and reactive to light.  Neck: Normal range of motion and full passive range of motion without pain. Neck supple.  Cardiovascular: Normal rate, regular rhythm and normal heart sounds.  Exam reveals no gallop and no friction rub.   No murmur heard. Pulmonary/Chest: Effort normal and breath sounds normal. No respiratory distress. He has no wheezes. He has no rhonchi. He has no rales. He exhibits no tenderness and no crepitus.  Abdominal: Soft. Normal appearance and bowel sounds are normal. He exhibits no distension. There is tenderness. There is no rebound and no guarding.    TTP to the left of the umbilicus  Musculoskeletal: Normal range of motion. He exhibits no edema or tenderness.  Moves all extremities well.   Neurological: He is alert and oriented to person, place, and time. He has normal strength. No cranial nerve deficit.  Skin: Skin is warm, dry and intact. No rash noted. No erythema. No pallor.   Psychiatric: His behavior is normal. His mood appears anxious. His speech is rapid and/or pressured.  Nursing note and vitals reviewed.   ED Course  Procedures   Medications  0.9 %  sodium chloride infusion (0 mLs Intravenous Stopped 10/21/15 0013)    Followed by  0.9 %  sodium chloride infusion (1,000 mLs Intravenous New Bag/Given 10/21/15 0013)    Followed by  0.9 %  sodium chloride infusion (not administered)  HYDROmorphone (DILAUDID) injection 1 mg (1 mg Intravenous Given 10/20/15 2300)  promethazine (PHENERGAN) injection 12.5 mg (12.5 mg Intravenous Given 10/20/15 2335)    DIAGNOSTIC STUDIES: Oxygen Saturation is 100% on RA, normal by my interpretation.    COORDINATION OF CARE: 11:26 PM - patient had already received Dilaudid for his pain prior to my exam and he states his pain was improving. Discussed lab results. Discussed plans to order diagnostic imaging, IV fluids, Bentyl, and phenergan. Pt advised of  plan for treatment and pt agrees.  12:06 AM - Discussed lab results. Discussed XR results of diverticulosis with prior contrast still in the diverticulae. Will consult with Dr Laural Golden and continue to observe pt. Pt advised of plan for treatment and pt agrees. He continues to state his symptoms are improving.  62 Dr Laural Golden, requests doing a urine and plasma porphyrin screening tests which were ordered and he will proceed with the colonoscopy.   12:51 AM - Discussed consult with Dr Laural Golden. Discussed plans to order requested studies. Pt advised of plan for treatment and pt agrees.  Patient was given a oral fluid trial which he was able to drink and states he has not had return of his pain or the nausea. He was advised to follow-up with Dr. Laural Golden to get the results of the porphyria test. He has bentyl to use at home if needed.   Labs Review Results for orders placed or performed during the hospital encounter of 10/20/15  Lipase, blood  Result Value Ref Range   Lipase 33 11 -  51 U/L  Comprehensive metabolic panel  Result Value Ref Range   Sodium 141 135 - 145 mmol/L   Potassium 3.8 3.5 - 5.1 mmol/L   Chloride 103 101 - 111 mmol/L   CO2 24 22 - 32 mmol/L   Glucose, Bld 123 (H) 65 - 99 mg/dL   BUN 18 6 - 20 mg/dL   Creatinine, Ser 1.05 0.61 - 1.24 mg/dL   Calcium 10.3 8.9 - 10.3 mg/dL   Total Protein 7.6 6.5 - 8.1 g/dL   Albumin 4.5 3.5 - 5.0 g/dL   AST 23 15 - 41 U/L   ALT 27 17 - 63 U/L   Alkaline Phosphatase 50 38 - 126 U/L   Total Bilirubin 0.8 0.3 - 1.2 mg/dL   GFR calc non Af Amer >60 >60 mL/min   GFR calc Af Amer >60 >60 mL/min   Anion gap 14 5 - 15  CBC  Result Value Ref Range   WBC 16.6 (H) 4.0 - 10.5 K/uL   RBC 5.08 4.22 - 5.81 MIL/uL   Hemoglobin 15.8 13.0 - 17.0 g/dL   HCT 45.4 39.0 - 52.0 %   MCV 89.4 78.0 - 100.0 fL   MCH 31.1 26.0 - 34.0 pg   MCHC 34.8 30.0 - 36.0 g/dL   RDW 13.1 11.5 - 15.5 %   Platelets 232 150 - 400 K/uL  Urinalysis, Routine w reflex microscopic (not at [facility])  Result Value Ref Range   Color, Urine YELLOW YELLOW   APPearance CLEAR CLEAR   Specific Gravity, Urine 1.020 1.005 - 1.030   pH 7.0 5.0 - 8.0   Glucose, UA NEGATIVE NEGATIVE mg/dL   Hgb urine dipstick NEGATIVE NEGATIVE   Bilirubin Urine NEGATIVE NEGATIVE   Ketones, ur 40 (A) NEGATIVE mg/dL   Protein, ur NEGATIVE NEGATIVE mg/dL   Urobilinogen, UA 0.2 0.0 - 1.0 mg/dL   Nitrite NEGATIVE NEGATIVE   Leukocytes, UA NEGATIVE NEGATIVE  Urine rapid drug screen (hosp performed)  Result Value Ref Range   Opiates POSITIVE (A) NONE DETECTED   Cocaine NONE DETECTED NONE DETECTED   Benzodiazepines NONE DETECTED NONE DETECTED   Amphetamines NONE DETECTED NONE DETECTED   Tetrahydrocannabinol POSITIVE (A) NONE DETECTED   Barbiturates NONE DETECTED NONE DETECTED    Laboratory interpretation all normal except for leukocytosis  Dg Abd Acute W/chest  10/20/2015  CLINICAL DATA:  Intermittent periumbilical pain tonight. EXAM: DG ABDOMEN ACUTE W/ 1V  CHEST  COMPARISON:  Small bowel series October 16, 2015 and CT abdomen and pelvis October 09, 2015 FINDINGS: Cardiomediastinal silhouette is normal. Lungs are clear, no pleural effusions. No pneumothorax. Soft tissue planes and included osseous structures are nonsuspicious ; the old RIGHT anterior fourth and fifth rib fractures. Moderate degenerative change of the thoracic spine. Bowel gas pattern is nondilated and nonobstructive. Residual enteric contrast within multiple colonic diverticula. No intra-abdominal mass effect, pathologic calcifications or free air. Soft tissue planes and included osseous structures are non-suspicious. IMPRESSION: No acute cardiopulmonary process. Normal bowel gas pattern.  Diverticulosis. Electronically Signed   By: Elon Alas M.D.   On: 10/20/2015 23:56   Ct Abdomen Pelvis W Contrast  10/09/2015  CLINICAL DATA:  Recurrent non radiating severe periumbilical pain, onset several hours ago. Several similar episodes in June. n. IMPRESSION: Left colonic diverticulosis.  No active diverticulitis. No acute findings. Electronically Signed   By: Rolm Baptise M.D.   On: 10/09/2015 10:47   Dg Small Bowel  10/16/2015  CLINICAL DATA:  Lower mid abdominal pain and vomiting x 7 days This is a reoccurrence from the 1st week in July 2016-pt said he had the same symptoms at this time FINDINGS: Scout film demonstrates normal bowel gas pattern. Oral contrast material is noted with in the left colonic diverticula. No organomegaly or free air. Transit time to the colon is 1 hour. Small bowel is normal caliber. No focal abnormality. Spot compression images of the terminal ileum are normal. IMPRESSION: Unremarkable small bowel follow-through. Electronically Signed   By: Rolm Baptise M.D.   On: 10/16/2015 10:16    I have personally reviewed and evaluated these images and lab results as part of my medical decision-making.   EKG Interpretation None      MDM   Final diagnoses:  Lower  abdominal pain  Nausea and vomiting, vomiting of unspecified type   Plan discharge  Rolland Porter, MD, FACEP   I personally performed the services described in this documentation, which was scribed in my presence. The recorded information has been reviewed and considered.  Rolland Porter, MD, Barbette Or, MD 10/21/15 [phone]

## 2015-10-21 ENCOUNTER — Encounter (HOSPITAL_COMMUNITY): Payer: Self-pay | Admitting: Internal Medicine

## 2015-10-21 LAB — RAPID URINE DRUG SCREEN, HOSP PERFORMED
Amphetamines: NOT DETECTED
BENZODIAZEPINES: NOT DETECTED
Barbiturates: NOT DETECTED
COCAINE: NOT DETECTED
Opiates: POSITIVE — AB
Tetrahydrocannabinol: POSITIVE — AB

## 2015-10-21 NOTE — Discharge Instructions (Signed)
Call Dr Olevia Perches office to get the results of the porphyria tests done tonight. Use the bentyl if you get pain again. Dr Laural Golden is going to proceed with your colonoscopy as planned. Let him know if you are getting worse instead of better.

## 2015-10-22 ENCOUNTER — Encounter (HOSPITAL_COMMUNITY)
Admission: RE | Admit: 2015-10-22 | Discharge: 2015-10-22 | Disposition: A | Discharge status: Home / Self Care | Payer: 59 | Source: Ambulatory Visit | Attending: Internal Medicine | Admitting: Internal Medicine

## 2015-10-26 ENCOUNTER — Ambulatory Visit (HOSPITAL_COMMUNITY)
Admission: RE | Admit: 2015-10-26 | Discharge: 2015-10-26 | Disposition: A | Discharge status: Home / Self Care | Payer: 59 | Source: Ambulatory Visit | Attending: Internal Medicine | Admitting: Internal Medicine

## 2015-10-26 ENCOUNTER — Ambulatory Visit (HOSPITAL_COMMUNITY): Payer: 59 | Admitting: Anesthesiology

## 2015-10-26 ENCOUNTER — Encounter (HOSPITAL_COMMUNITY): Payer: Self-pay | Admitting: *Deleted

## 2015-10-26 ENCOUNTER — Encounter (HOSPITAL_COMMUNITY)
Admission: RE | Disposition: A | Discharge status: Home / Self Care | Payer: Self-pay | Source: Ambulatory Visit | Attending: Internal Medicine

## 2015-10-26 DIAGNOSIS — F17210 Nicotine dependence, cigarettes, uncomplicated: Secondary | ICD-10-CM | POA: Diagnosis not present

## 2015-10-26 DIAGNOSIS — Z6830 Body mass index (BMI) 30.0-30.9, adult: Secondary | ICD-10-CM | POA: Diagnosis not present

## 2015-10-26 DIAGNOSIS — K5730 Diverticulosis of large intestine without perforation or abscess without bleeding: Secondary | ICD-10-CM

## 2015-10-26 DIAGNOSIS — K648 Other hemorrhoids: Secondary | ICD-10-CM | POA: Diagnosis not present

## 2015-10-26 DIAGNOSIS — R109 Unspecified abdominal pain: Secondary | ICD-10-CM

## 2015-10-26 DIAGNOSIS — Z7982 Long term (current) use of aspirin: Secondary | ICD-10-CM | POA: Diagnosis not present

## 2015-10-26 HISTORY — PX: COLONOSCOPY WITH PROPOFOL: SHX5780

## 2015-10-26 LAB — PORPHOBILINOGEN, RANDOM URINE: QUANTITATIVE PORPHOBILINOGEN: 1.4 mg/L (ref 0.0–2.0)

## 2015-10-26 SURGERY — COLONOSCOPY WITH PROPOFOL
Anesthesia: Monitor Anesthesia Care

## 2015-10-26 MED ORDER — MIDAZOLAM HCL 2 MG/2ML IJ SOLN
1.0000 mg | INTRAMUSCULAR | Status: DC | PRN
  Administered 2015-10-26: 2 mg via INTRAVENOUS

## 2015-10-26 MED ORDER — FENTANYL CITRATE (PF) 100 MCG/2ML IJ SOLN: 25.0000 ug | INTRAMUSCULAR | Status: DC | PRN

## 2015-10-26 MED ORDER — ONDANSETRON HCL 4 MG/2ML IJ SOLN: 4.0000 mg | Freq: Once | INTRAMUSCULAR | Status: DC | PRN

## 2015-10-26 MED ORDER — PROPOFOL 10 MG/ML IV BOLUS
INTRAVENOUS | Status: AC
  Filled 2015-10-26: qty 20

## 2015-10-26 MED ORDER — MIDAZOLAM HCL 5 MG/5ML IJ SOLN
INTRAMUSCULAR | Status: DC | PRN
  Administered 2015-10-26: 1 mg via INTRAVENOUS

## 2015-10-26 MED ORDER — LACTATED RINGERS IV SOLN
INTRAVENOUS | Status: DC
  Administered 2015-10-26: 08:00:00 via INTRAVENOUS

## 2015-10-26 MED ORDER — FENTANYL CITRATE (PF) 100 MCG/2ML IJ SOLN
25.0000 ug | INTRAMUSCULAR | Status: AC
  Administered 2015-10-26: 25 ug via INTRAVENOUS

## 2015-10-26 MED ORDER — ONDANSETRON HCL 4 MG/2ML IJ SOLN
INTRAMUSCULAR | Status: AC
  Filled 2015-10-26: qty 2

## 2015-10-26 MED ORDER — GLYCOPYRROLATE 0.2 MG/ML IJ SOLN
INTRAMUSCULAR | Status: AC
  Filled 2015-10-26: qty 1

## 2015-10-26 MED ORDER — MIDAZOLAM HCL 2 MG/2ML IJ SOLN
INTRAMUSCULAR | Status: AC
  Filled 2015-10-26: qty 4

## 2015-10-26 MED ORDER — MIDAZOLAM HCL 2 MG/2ML IJ SOLN
INTRAMUSCULAR | Status: AC
  Filled 2015-10-26: qty 2

## 2015-10-26 MED ORDER — FENTANYL CITRATE (PF) 100 MCG/2ML IJ SOLN
INTRAMUSCULAR | Status: AC
  Filled 2015-10-26: qty 2

## 2015-10-26 MED ORDER — DICYCLOMINE HCL 20 MG PO TABS: 10.0000 mg | ORAL_TABLET | Freq: Three times a day (TID) | ORAL | Status: DC

## 2015-10-26 MED ORDER — PROPOFOL 500 MG/50ML IV EMUL
INTRAVENOUS | Status: DC | PRN
  Administered 2015-10-26: 150 ug/kg/min via INTRAVENOUS

## 2015-10-26 MED ORDER — ONDANSETRON HCL 4 MG/2ML IJ SOLN
4.0000 mg | Freq: Once | INTRAMUSCULAR | Status: AC
Start: 2015-10-26 — End: 2015-10-26
  Administered 2015-10-26: 4 mg via INTRAVENOUS

## 2015-10-26 MED ORDER — STERILE WATER FOR IRRIGATION IR SOLN
Status: DC | PRN
  Administered 2015-10-26: 1000 mL

## 2015-10-26 MED ORDER — GLYCOPYRROLATE 0.2 MG/ML IJ SOLN
0.2000 mg | Freq: Once | INTRAMUSCULAR | Status: AC
  Administered 2015-10-26: 0.2 mg via INTRAVENOUS

## 2015-10-26 MED ORDER — PROPOFOL 10 MG/ML IV BOLUS
INTRAVENOUS | Status: DC | PRN
  Administered 2015-10-26: 10 mg via INTRAVENOUS

## 2015-10-26 MED ORDER — LACTATED RINGERS IV SOLN
INTRAVENOUS | Status: DC | PRN
  Administered 2015-10-26: 07:00:00 via INTRAVENOUS

## 2015-10-26 SURGICAL SUPPLY — 22 items
ELECT REM PT RETURN 9FT ADLT (ELECTROSURGICAL)
ELECTRODE REM PT RTRN 9FT ADLT (ELECTROSURGICAL) IMPLANT
FCP BXJMBJMB 240X2.8X (CUTTING FORCEPS)
FLOOR PAD 36X40 (MISCELLANEOUS) ×3
FORCEPS BIOP RAD 4 LRG CAP 4 (CUTTING FORCEPS) IMPLANT
FORCEPS BIOP RJ4 240 W/NDL (CUTTING FORCEPS)
FORCEPS BXJMBJMB 240X2.8X (CUTTING FORCEPS) IMPLANT
FORMALIN 10 PREFIL 20ML (MISCELLANEOUS) IMPLANT
INJECTOR/SNARE I SNARE (MISCELLANEOUS) IMPLANT
KIT ENDO PROCEDURE PEN (KITS) ×3 IMPLANT
MANIFOLD NEPTUNE II (INSTRUMENTS) ×2 IMPLANT
NDL SCLEROTHERAPY 25GX240 (NEEDLE) IMPLANT
NEEDLE SCLEROTHERAPY 25GX240 (NEEDLE) IMPLANT
PAD FLOOR 36X40 (MISCELLANEOUS) IMPLANT
PROBE APC STR FIRE (PROBE) IMPLANT
PROBE INJECTION GOLD (MISCELLANEOUS)
PROBE INJECTION GOLD 7FR (MISCELLANEOUS) IMPLANT
SNARE ROTATE MED OVAL 20MM (MISCELLANEOUS) IMPLANT
SNARE SHORT THROW 13M SML OVAL (MISCELLANEOUS) ×1 IMPLANT
TRAP SPECIMEN MUCOUS 40CC (MISCELLANEOUS) IMPLANT
TUBING IRRIGATION ENDOGATOR (MISCELLANEOUS) ×2 IMPLANT
WATER STERILE IRR 1000ML POUR (IV SOLUTION) ×2 IMPLANT

## 2015-10-26 NOTE — Transfer of Care (Signed)
Immediate Anesthesia Transfer of Care Note  Patient: Gordon Richards  Procedure(s) Performed: Procedure(s) with comments: COLONOSCOPY WITH PROPOFOL (N/A) - Cecum time in  0748  time out  0758  total time 10 minutes  Patient Location: PACU  Anesthesia Type:MAC  Level of Consciousness: awake and alert   Airway & Oxygen Therapy: Patient Spontanous Breathing  Post-op Assessment: Report given to RN  Post vital signs: Reviewed and stable  Last Vitals:  Filed Vitals:   10/26/15 0636  Temp: 93.7 C    Complications: No apparent anesthesia complications

## 2015-10-26 NOTE — Anesthesia Preprocedure Evaluation (Signed)
Anesthesia Evaluation  Patient identified by MRN, date of birth, ID band Patient awake    Reviewed: Allergy & Precautions, H&P , NPO status , Patient's Chart, lab work & pertinent test results  History of Anesthesia Complications Negative for: history of anesthetic complications  Airway Mallampati: II  TM Distance: >3 FB     Dental  (+) Teeth Intact   Pulmonary Current Smoker,    breath sounds clear to auscultation       Cardiovascular negative cardio ROS   Rhythm:Regular Rate:Normal     Neuro/Psych    GI/Hepatic neg GERD  ,  Endo/Other  Morbid obesity  Renal/GU      Musculoskeletal   Abdominal   Peds  Hematology   Anesthesia Other Findings   Reproductive/Obstetrics                             Anesthesia Physical Anesthesia Plan  ASA: II  Anesthesia Plan: MAC   Post-op Pain Management:    Induction: Intravenous  Airway Management Planned: Simple Face Mask  Additional Equipment:   Intra-op Plan:   Post-operative Plan: Extubation in OR  Informed Consent: I have reviewed the patients History and Physical, chart, labs and discussed the procedure including the risks, benefits and alternatives for the proposed anesthesia with the patient or authorized representative who has indicated his/her understanding and acceptance.     Plan Discussed with:   Anesthesia Plan Comments:         Anesthesia Quick Evaluation

## 2015-10-26 NOTE — Anesthesia Postprocedure Evaluation (Signed)
  Anesthesia Post-op Note  Patient: Gordon Richards  Procedure(s) Performed: Procedure(s) with comments: COLONOSCOPY WITH PROPOFOL (N/A) - Cecum time in  0748  time out  0758  total time 10 minutes  Patient Location: PACU  Anesthesia Type:MAC  Level of Consciousness: awake, alert  and oriented  Airway and Oxygen Therapy: Patient Spontanous Breathing and Patient connected to nasal cannula oxygen  Post-op Pain: none  Post-op Assessment: Post-op Vital signs reviewed, Patient's Cardiovascular Status Stable, Respiratory Function Stable, Patent Airway and No signs of Nausea or vomiting              Post-op Vital Signs: Reviewed and stable  Last Vitals:  Filed Vitals:   10/26/15 0636  Temp: 22.2 C    Complications: No apparent anesthesia complications

## 2015-10-26 NOTE — Op Note (Signed)
COLONOSCOPY PROCEDURE REPORT  PATIENT:  Gordon Richards  MR#:  [id number] Birthdate:  11/07/1961, 54 y.o., male Endoscopist:  Dr. [name], MD  Procedure Date: 10/26/2015  Procedure:   Colonoscopy  Indications:  Patient is 54 year old Caucasian male who has never been screened for [facility] who presents with intermittent episodes of excruciating abdominal pain. She's been seen in emergency room on 3 occasions. Workup has been negative including routine blood work abdominopelvic CT 2 EGD and small bowel follow-through. Tests for acute intermittent porphyria are pending. He is undergoing colonoscopy for diagnostic and screening purposes.  Informed Consent:  The procedure and risks were reviewed with the patient and informed consent was obtained.  Medications:  Monitored anesthesia care. Please see anesthesia records for details.  Description of procedure:  After a digital rectal exam was performed, that colonoscope was advanced from the anus through the rectum and colon to the area of the cecum, ileocecal valve and appendiceal orifice. The cecum was deeply intubated. These structures were well-seen and photographed for the record. Scope was advanced into terminal ileum for 20 cm. From the level of the cecum and ileocecal valve, the scope was slowly and cautiously withdrawn. The mucosal surfaces were carefully surveyed utilizing scope tip to flexion to facilitate fold flattening as needed. The scope was pulled down into the rectum where a thorough exam including retroflexion was performed.  Findings:   Prep excellent. Normal mucosa of terminal ileum. Normal mucosa of cecum, ascending colon, hepatic flexure, transverse colon, splenic flexure and descending colon. Few small diverticula noted at sigmoid colon. Normal rectal mucosa. Small hemorrhoids above the dentate line.    Therapeutic/Diagnostic Maneuvers Performed:  None  Complications:  None  EBL: None  Cecal Withdrawal Time:  10  minutes  Impression:  Normal mucosa of terminal ileum. Normal colonoscopy except mild sigmoid colon diverticulosis and small internal hemorrhoids..   Recommendations:  Standard instructions given. Take dicyclomine 10 mg by mouth 30 minutes before each meal rather than on when necessary basis. I will be contacting patient with results of pending tests and further recommendations.  Kendy Haston U  10/26/2015 8:03 AM  CC: Dr. [name], MD & Dr. Rayne Du ref. provider found

## 2015-10-26 NOTE — Discharge Instructions (Signed)
Resume usual medications except take dicyclomine 10 mg by mouth 30 minutes before each meal. No driving for 24 hours. Physician will call with results of pending tests when completed.

## 2015-10-26 NOTE — H&P (Signed)
Gordon Richards is an 54 y.o. male.   Chief Complaint: Patient is here for colonoscopy. HPI: Patient is 54 year old Caucasian male who presents with intermittent episodes of severe abdominal pain associated with nausea and vomiting. First episode occurred in June 2016 when he was evaluated in emergency room and abdominopelvic CT as well as labs unremarkable. He another episode about 2 weeks ago. Another CT was unremarkable. He had EGD the same date was also within normal limits. Patient was seen in emergency room last week again. He has had small bowel follow-through on 10/16/2015 and was within normal limits. He denies diarrhea melena or rectal bleeding. He has good appetite. He has lost 143 pounds in the last 2 years. States she's lost weight because he walks every day and these changes eating habits. He doesn't drink alcohol but he smokes less than half a pack of cigarettes per day. Family history is negative for CRC.  History reviewed. No pertinent past medical history.  Past Surgical History  Procedure Laterality Date  . Hemorroidectomy  2003  . Foot surgery Right     bone cutting  . Lipoma excision Right 11/08/2013    Procedure: EXCISION NEOPLASM RIGHT ARM;  Surgeon: [name], MD;  Location: [facility];  Service: General;  Laterality: Right;  . Mass excision Right 11/08/2013    Procedure: EXCISION NEOPLASM SCROTUM;  Surgeon: [name], MD;  Location: [facility];  Service: General;  Laterality: Right;  . Esophagogastroduodenoscopy N/A 10/09/2015    Procedure: ESOPHAGOGASTRODUODENOSCOPY (EGD);  Surgeon: [name], MD;  Location: [facility];  Service: Endoscopy;  Laterality: N/A;    History reviewed. No pertinent family history. Social History:  reports that he has been smoking Cigarettes.  He has a 5 pack-year smoking history. He does not have any smokeless tobacco history on file. He reports that he drinks alcohol. He reports that he uses illicit drugs (Marijuana).  Allergies:  No Known Allergies  Medications Prior to Admission  Medication Sig Dispense Refill  . aspirin EC 81 MG tablet Take 1 tablet (81 mg total) by mouth daily.    Marland Kitchen dicyclomine (BENTYL) 20 MG tablet Take 1 tablet (20 mg total) by mouth every 6 (six) hours as needed for spasms. 20 tablet 0  . Fish Oil-Cholecalciferol (FISH OIL + D3 PO) Take 1 capsule by mouth daily.    . pantoprazole (PROTONIX) 40 MG tablet Take 1 tablet (40 mg total) by mouth daily before breakfast. 30 tablet 5  . HYDROcodone-acetaminophen (NORCO/VICODIN) 5-325 MG per tablet Take 2 tablets by mouth every 4 (four) hours as needed for moderate pain. (Patient not taking: Reported on 10/20/2015) 10 tablet 0  . TURMERIC PO Take 2 tablets by mouth daily.    . vitamin C (ASCORBIC ACID) 500 MG tablet Take 1,000 mg by mouth daily.       No results found for this or any previous visit (from the past 48 hour(s)). No results found.  ROS  Temperature 97.9 F (36.6 C). Physical Exam  Constitutional: He appears well-developed and well-nourished.  HENT:  Mouth/Throat: Oropharynx is clear and moist.  Eyes: Conjunctivae are normal. No scleral icterus.  Neck: No thyromegaly present.  Cardiovascular: Normal rate, regular rhythm and normal heart sounds.   No murmur heard. Respiratory: Effort normal and breath sounds normal.  GI:  Abdomen is symmetrical. Bowel sounds are normal. On palpation it is soft and nontender without organomegaly or masses.  Musculoskeletal: He exhibits no edema.  Lymphadenopathy:  He has no cervical adenopathy.  Neurological: He is alert.  Skin: Skin is warm and dry.     Assessment/Plan Recurrent spells of abdominal pain associated with nausea and vomiting with negative workup. Patient's symptom complex ingestive of transient bowel obstruction or intussusception. Diagnostic colonoscopy under monitored anesthesia care.  REHMAN,NAJEEB U 10/26/2015, 7:22 AM

## 2015-10-30 LAB — PORPHYRINS, FRACTIONATION-PLASMA: Hexacarboxyl Porphyrins: <1 ug/dL (ref 0.0–1.0)

## 2015-11-02 ENCOUNTER — Other Ambulatory Visit (HOSPITAL_COMMUNITY): Payer: 59

## 2015-11-14 ENCOUNTER — Emergency Department (HOSPITAL_COMMUNITY): Payer: 59

## 2015-11-14 ENCOUNTER — Emergency Department (HOSPITAL_COMMUNITY)
Admission: EM | Admit: 2015-11-14 | Discharge: 2015-11-15 | Disposition: A | Discharge status: Home / Self Care | Payer: 59 | Attending: Emergency Medicine | Admitting: Emergency Medicine

## 2015-11-14 ENCOUNTER — Encounter (HOSPITAL_COMMUNITY): Payer: Self-pay

## 2015-11-14 DIAGNOSIS — K5732 Diverticulitis of large intestine without perforation or abscess without bleeding: Secondary | ICD-10-CM | POA: Insufficient documentation

## 2015-11-14 DIAGNOSIS — R109 Unspecified abdominal pain: Secondary | ICD-10-CM

## 2015-11-14 DIAGNOSIS — F17210 Nicotine dependence, cigarettes, uncomplicated: Secondary | ICD-10-CM | POA: Diagnosis not present

## 2015-11-14 DIAGNOSIS — Z7982 Long term (current) use of aspirin: Secondary | ICD-10-CM | POA: Insufficient documentation

## 2015-11-14 DIAGNOSIS — R1032 Left lower quadrant pain: Secondary | ICD-10-CM | POA: Diagnosis present

## 2015-11-14 DIAGNOSIS — Z79899 Other long term (current) drug therapy: Secondary | ICD-10-CM | POA: Insufficient documentation

## 2015-11-14 LAB — COMPREHENSIVE METABOLIC PANEL
ALBUMIN: 4.6 g/dL (ref 3.5–5.0)
ALT: 19 U/L (ref 17–63)
AST: 21 U/L (ref 15–41)
Alkaline Phosphatase: 43 U/L (ref 38–126)
Anion gap: 12 (ref 5–15)
BUN: 16 mg/dL (ref 6–20)
CHLORIDE: 106 mmol/L (ref 101–111)
CO2: 24 mmol/L (ref 22–32)
Calcium: 9.6 mg/dL (ref 8.9–10.3)
Creatinine, Ser: 0.95 mg/dL (ref 0.61–1.24)
GFR calc Af Amer: >60 mL/min (ref >60)
GFR calc non Af Amer: >60 mL/min (ref >60)
GLUCOSE: 114 mg/dL — AB (ref 65–99)
POTASSIUM: 3.5 mmol/L (ref 3.5–5.1)
Sodium: 142 mmol/L (ref 135–145)
Total Bilirubin: 0.7 mg/dL (ref 0.3–1.2)
Total Protein: 7.6 g/dL (ref 6.5–8.1)

## 2015-11-14 LAB — CBC WITH DIFFERENTIAL/PLATELET
BASOS ABS: 0.1 10*3/uL (ref 0.0–0.1)
Basophils Relative: 1 %
EOS ABS: 1.2 10*3/uL — AB (ref 0.0–0.7)
EOS PCT: 7 %
HCT: 44.6 % (ref 39.0–52.0)
Hemoglobin: 15.2 g/dL (ref 13.0–17.0)
LYMPHS PCT: 13 %
Lymphs Abs: 2.4 10*3/uL (ref 0.7–4.0)
MCH: 30.3 pg (ref 26.0–34.0)
MCHC: 34.1 g/dL (ref 30.0–36.0)
MCV: 88.8 fL (ref 78.0–100.0)
Monocytes Absolute: 1.5 10*3/uL — ABNORMAL HIGH (ref 0.1–1.0)
Monocytes Relative: 8 %
Neutro Abs: 13.3 10*3/uL — ABNORMAL HIGH (ref 1.7–7.7)
Neutrophils Relative %: 71 %
PLATELETS: 202 10*3/uL (ref 150–400)
RBC: 5.02 MIL/uL (ref 4.22–5.81)
RDW: 13.6 % (ref 11.5–15.5)
WBC: 18.6 10*3/uL — AB (ref 4.0–10.5)

## 2015-11-14 LAB — LIPASE, BLOOD: LIPASE: 53 U/L — AB (ref 11–51)

## 2015-11-14 MED ORDER — ONDANSETRON 4 MG PO TBDP: ORAL_TABLET | ORAL | Status: DC

## 2015-11-14 MED ORDER — IOHEXOL 300 MG/ML  SOLN
100.0000 mL | Freq: Once | INTRAMUSCULAR | Status: AC | PRN
  Administered 2015-11-14: 100 mL via INTRAVENOUS

## 2015-11-14 MED ORDER — SODIUM CHLORIDE 0.9 % IV BOLUS (SEPSIS)
1000.0000 mL | Freq: Once | INTRAVENOUS | Status: AC
  Administered 2015-11-14: 1000 mL via INTRAVENOUS

## 2015-11-14 MED ORDER — METRONIDAZOLE 500 MG PO TABS
500.0000 mg | ORAL_TABLET | Freq: Once | ORAL | Status: AC
  Administered 2015-11-14: 500 mg via ORAL
  Filled 2015-11-14: qty 1

## 2015-11-14 MED ORDER — OXYCODONE-ACETAMINOPHEN 5-325 MG PO TABS: 1.0000 | ORAL_TABLET | Freq: Four times a day (QID) | ORAL | Status: DC | PRN

## 2015-11-14 MED ORDER — CIPROFLOXACIN HCL 500 MG PO TABS: 500.0000 mg | ORAL_TABLET | Freq: Two times a day (BID) | ORAL | Status: DC

## 2015-11-14 MED ORDER — HYDROMORPHONE HCL 1 MG/ML IJ SOLN
1.0000 mg | Freq: Once | INTRAMUSCULAR | Status: AC
  Administered 2015-11-14: 1 mg via INTRAVENOUS
  Filled 2015-11-14: qty 1

## 2015-11-14 MED ORDER — LORAZEPAM 2 MG/ML IJ SOLN
0.5000 mg | Freq: Once | INTRAMUSCULAR | Status: AC
  Administered 2015-11-14: 0.5 mg via INTRAVENOUS
  Filled 2015-11-14: qty 1

## 2015-11-14 MED ORDER — HYDROMORPHONE HCL 1 MG/ML IJ SOLN
INTRAMUSCULAR | Status: AC
  Administered 2015-11-14: 1 mg
  Filled 2015-11-14: qty 1

## 2015-11-14 MED ORDER — METRONIDAZOLE 500 MG PO TABS
500.0000 mg | ORAL_TABLET | Freq: Two times a day (BID) | ORAL | Status: DC
Start: 2015-11-15 — End: 2015-11-29

## 2015-11-14 MED ORDER — CIPROFLOXACIN HCL 250 MG PO TABS
500.0000 mg | ORAL_TABLET | Freq: Once | ORAL | Status: AC
  Administered 2015-11-14: 500 mg via ORAL
  Filled 2015-11-14: qty 2

## 2015-11-14 NOTE — Discharge Instructions (Signed)
Follow up with your  Family md or dr. Laural Golden this week  Diverticulitis Diverticulitis is inflammation or infection of small pouches in your colon that form when you have a condition called diverticulosis. The pouches in your colon are called diverticula. Your colon, or large intestine, is where water is absorbed and stool is formed. Complications of diverticulitis can include:  Bleeding.  Severe infection.  Severe pain.  Perforation of your colon.  Obstruction of your colon. CAUSES  Diverticulitis is caused by bacteria. Diverticulitis happens when stool becomes trapped in diverticula. This allows bacteria to grow in the diverticula, which can lead to inflammation and infection. RISK FACTORS People with diverticulosis are at risk for diverticulitis. Eating a diet that does not include enough fiber from fruits and vegetables may make diverticulitis more likely to develop. SYMPTOMS  Symptoms of diverticulitis may include:  Abdominal pain and tenderness. The pain is normally located on the left side of the abdomen, but may occur in other areas.  Fever and chills.  Bloating.  Cramping.  Nausea.  Vomiting.  Constipation.  Diarrhea.  Blood in your stool. DIAGNOSIS  Your health care provider will ask you about your medical history and do a physical exam. You may need to have tests done because many medical conditions can cause the same symptoms as diverticulitis. Tests may include:  Blood tests.  Urine tests.  Imaging tests of the abdomen, including X-rays and CT scans. When your condition is under control, your health care provider may recommend that you have a colonoscopy. A colonoscopy can show how severe your diverticula are and whether something else is causing your symptoms. TREATMENT  Most cases of diverticulitis are mild and can be treated at home. Treatment may include:  Taking over-the-counter pain medicines.  Following a clear liquid diet.  Taking antibiotic  medicines by mouth for 7-10 days. More severe cases may be treated at a hospital. Treatment may include:  Not eating or drinking.  Taking prescription pain medicine.  Receiving antibiotic medicines through an IV tube.  Receiving fluids and nutrition through an IV tube.  Surgery. HOME CARE INSTRUCTIONS   Follow your health care provider's instructions carefully.  Follow a full liquid diet or other diet as directed by your health care provider. After your symptoms improve, your health care provider may tell you to change your diet. He or she may recommend you eat a high-fiber diet. Fruits and vegetables are good sources of fiber. Fiber makes it easier to pass stool.  Take fiber supplements or probiotics as directed by your health care provider.  Only take medicines as directed by your health care provider.  Keep all your follow-up appointments. SEEK MEDICAL CARE IF:   Your pain does not improve.  You have a hard time eating food.  Your bowel movements do not return to normal. SEEK IMMEDIATE MEDICAL CARE IF:   Your pain becomes worse.  Your symptoms do not get better.  Your symptoms suddenly get worse.  You have a fever.  You have repeated vomiting.  You have bloody or black, tarry stools. MAKE SURE YOU:   Understand these instructions.  Will watch your condition.  Will get help right away if you are not doing well or get worse.   This information is not intended to replace advice given to you by your health care provider. Make sure you discuss any questions you have with your health care provider.   Document Released: 09/21/2005 Document Revised: 12/17/2013 Document Reviewed: 11/06/2013 Elsevier Interactive Patient  Education ©2016 [employer] ° °

## 2015-11-14 NOTE — ED Provider Notes (Signed)
CSN: [id number]     Arrival date & time 11/14/15  2017 History  By signing my name below, I, Hilda Lias, attest that this documentation has been prepared under the direction and in the presence of Milton Ferguson, MD. Electronically Signed: Hilda Lias, ED Scribe. 11/14/2015. 8:52 PM.    Chief Complaint  Patient presents with  . Abdominal Pain      Patient is a 54 y.o. male presenting with abdominal pain. The history is provided by the patient. No language interpreter was used.  Abdominal Pain Pain location:  LLQ Pain quality: sharp   Pain radiates to:  Does not radiate Pain severity:  Severe Onset quality:  Sudden Duration:  2 hours Timing:  Constant Progression:  Worsening Chronicity:  Recurrent Relieved by:  Nothing Associated symptoms: vomiting   Associated symptoms: no chest pain, no cough, no diarrhea, no fatigue and no hematuria    HPI Comments: Gordon Richards is a 54 y.o. male who presents to the Emergency Department complaining of constant, worsening, recurrent LLQ abdominal pain with associated vomiting that has been present since earlier today. Pt states he has vomited 4-5x since the pain began, and reports his vomit is composed of bile. Pt reports a hx of the same symptoms, and states that he last had pain in the exact same area four months ago. Pt reports coming to ED for the same symptoms several times, and states that his gastroenterologist cannot determine what is wrong with his abdomen either.   History reviewed. No pertinent past medical history. Past Surgical History  Procedure Laterality Date  . Hemorroidectomy  2003  . Foot surgery Right     bone cutting  . Lipoma excision Right 11/08/2013    Procedure: EXCISION NEOPLASM RIGHT ARM;  Surgeon: [name], MD;  Location: [facility];  Service: General;  Laterality: Right;  . Mass excision Right 11/08/2013    Procedure: EXCISION NEOPLASM SCROTUM;  Surgeon: [name], MD;  Location: [facility];  Service:  General;  Laterality: Right;  . Esophagogastroduodenoscopy N/A 10/09/2015    Procedure: ESOPHAGOGASTRODUODENOSCOPY (EGD);  Surgeon: [name], MD;  Location: [facility];  Service: Endoscopy;  Laterality: N/A;  . Colonoscopy with propofol N/A 10/26/2015    Procedure: COLONOSCOPY WITH PROPOFOL;  Surgeon: [name], MD;  Location: [facility];  Service: Endoscopy;  Laterality: N/A;  Cecum time in  0748  time out  0758  total time 10 minutes   No family history on file. Social History  Substance Use Topics  . Smoking status: Current Every Day Smoker -- 1.00 packs/day for 5 years    Types: Cigarettes  . Smokeless tobacco: None  . Alcohol Use: Yes     Comment: rarely    Review of Systems  Constitutional: Negative for appetite change and fatigue.  HENT: Negative for congestion, ear discharge and sinus pressure.   Eyes: Negative for discharge.  Respiratory: Negative for cough.   Cardiovascular: Negative for chest pain.  Gastrointestinal: Positive for vomiting and abdominal pain. Negative for diarrhea.  Genitourinary: Negative for frequency and hematuria.  Musculoskeletal: Negative for back pain.  Skin: Negative for rash.  Neurological: Negative for seizures and headaches.  Psychiatric/Behavioral: Negative for hallucinations.      Allergies  Review of patient's allergies indicates no known allergies.  Home Medications   Prior to Admission medications   Medication Sig Start Date End Date Taking? Authorizing Provider  aspirin EC 81 MG tablet Take 1 tablet (81 mg total)  by mouth daily. 10/09/15   [name], MD  dicyclomine (BENTYL) 20 MG tablet Take 0.5 tablets (10 mg total) by mouth 3 (three) times daily before meals. 10/26/15   [name], MD  Fish Oil-Cholecalciferol (FISH OIL + D3 PO) Take 1 capsule by mouth daily.    Historical Provider, MD  HYDROcodone-acetaminophen (NORCO/VICODIN) 5-325 MG per tablet Take 2 tablets by mouth every 4 (four) hours as needed for  moderate pain. Patient not taking: Reported on 10/20/2015 06/13/15   Orpah Greek, MD  pantoprazole (PROTONIX) 40 MG tablet Take 1 tablet (40 mg total) by mouth daily before breakfast. 10/09/15   [name], MD  TURMERIC PO Take 2 tablets by mouth daily.    Historical Provider, MD  vitamin C (ASCORBIC ACID) 500 MG tablet Take 1,000 mg by mouth daily.     Historical Provider, MD   Pulse 62  Temp(Src) 97.6 F (36.4 C) (Oral)  Resp 24  Ht 5\' 10"  (1.778 m)  Wt 204 lb (92.534 kg)  BMI 29.27 kg/m2 Physical Exam  Constitutional: He is oriented to person, place, and time. He appears well-developed.  HENT:  Head: Normocephalic.  Eyes: Conjunctivae and EOM are normal. No scleral icterus.  Neck: Neck supple. No thyromegaly present.  Cardiovascular: Normal rate and regular rhythm.  Exam reveals no gallop and no friction rub.   No murmur heard. Pulmonary/Chest: No stridor. He has no wheezes. He has no rales. He exhibits no tenderness.  Abdominal: He exhibits no distension. There is tenderness. There is no rebound.  Mild LLQ tenderness  Musculoskeletal: Normal range of motion. He exhibits no edema.  Lymphadenopathy:    He has no cervical adenopathy.  Neurological: He is oriented to person, place, and time. He exhibits normal muscle tone. Coordination normal.  Skin: No rash noted. No erythema.  Psychiatric: He has a normal mood and affect. His behavior is normal.    ED Course  Procedures (including critical care time)  DIAGNOSTIC STUDIES: Oxygen Saturation is 97% on room air, normal by my interpretation.    COORDINATION OF CARE: 8:52 PM Discussed treatment plan with pt at bedside and pt agreed to plan.   Labs Review Labs Reviewed - No data to display  Imaging Review No results found. I have personally reviewed and evaluated these images and lab results as part of my medical decision-making.   EKG Interpretation None      MDM   Final diagnoses:  None   Labs  show white count 18,000 and lipase slightly elevated. Patient improving with Dilaudid and Zofran. CT scan pending.    Ct shows diverticulitis.  Pt given cipro and flagyl   The chart was scribed for me under my direct supervision.  I personally performed the history, physical, and medical decision making and all procedures in the evaluation of this patient.Milton Ferguson, MD 11/15/15 [phone]

## 2015-11-14 NOTE — ED Notes (Signed)
Having abdominal pain, vomiting, spasms per spouse. I have been having these episodes more frequently.

## 2015-11-14 NOTE — ED Notes (Signed)
Patient refusing CT scan, stating he wants "more pain medicine" and to go home.

## 2015-11-14 NOTE — ED Notes (Signed)
Patient in room lying in floor in fetal position, yelling and coursing. Patient finally got on stretcher, IV started and pain medication given.

## 2015-11-15 ENCOUNTER — Telehealth: Payer: Self-pay | Admitting: Internal Medicine

## 2015-11-15 LAB — URINALYSIS, ROUTINE W REFLEX MICROSCOPIC
Bilirubin Urine: NEGATIVE
GLUCOSE, UA: NEGATIVE mg/dL
Hgb urine dipstick: NEGATIVE
Ketones, ur: 40 mg/dL — AB
LEUKOCYTES UA: NEGATIVE
Nitrite: NEGATIVE
PH: 5.5 (ref 5.0–8.0)
PROTEIN: NEGATIVE mg/dL
SPECIFIC GRAVITY, URINE: 1.015 (ref 1.005–1.030)

## 2015-11-15 NOTE — Telephone Encounter (Signed)
Patient's wife called me last night stating patient had come to the ED. Was driving and had to pull off to the side of the road with acute onset right sided abdominal pain nausea and vomiting. Multiple episodes over the past couple of months. They were checking into the ED at our hospital.  Review chart. White count 18,000 and left sided uncomplicated diverticulitis. He apparently remains an outpatient. Will forward this communication to Dr. Laural Golden

## 2015-11-16 NOTE — Telephone Encounter (Signed)
Current CT shows changes of sigmoid diverticulitis. Prior CTs were negative. He just had colonoscopy 3 weeks ago revealing sigmoid diverticulosis. I'm not convinced prior episodes of pain were due to diverticulitis. I believe you dealing with 2 different situations. Talked with patient's wife came and he is feeling better with antibiotics. Patient is constipated. He can take MiraLAX.

## 2015-11-22 IMAGING — CT CT ABD-PELV W/ CM
2 of 5 series · 16 of 46 positions shown, 18 images · IV contrast (Omnipaque 300)
Comparison: Most recent CT 10/09/2015

CLINICAL DATA: Worsening recurrent left lower quadrant abdominal
pain with vomiting. Symptoms for 1 day.

EXAM:
CT ABDOMEN AND PELVIS WITH CONTRAST
TECHNIQUE: Multidetector CT imaging of the abdomen and pelvis was performed
using the standard protocol following bolus administration of
intravenous contrast.
CONTRAST:  100mL OMNIPAQUE IOHEXOL 300 MG/ML  SOLN

[Series 2: abd_pel_with 5.0 b40f · axial · 0.71mm/px · z∈[+434,+864]mm · 13 of 98 slices shown, 15 images]
[im 6/98  soft-tissue]
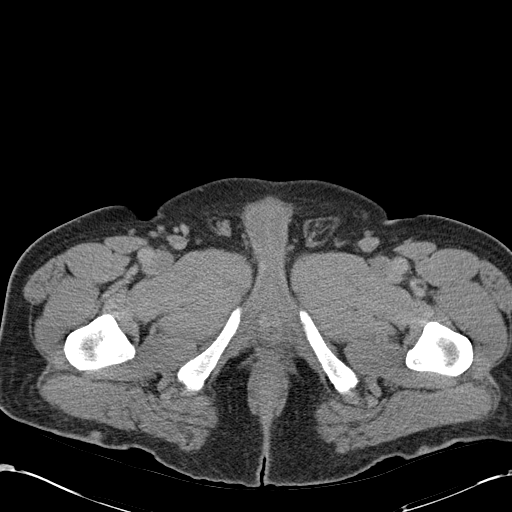
[im 6/98  bone]
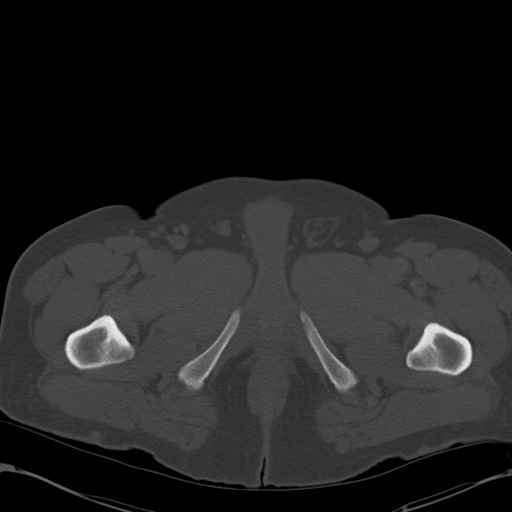
[im 12/98  soft-tissue]
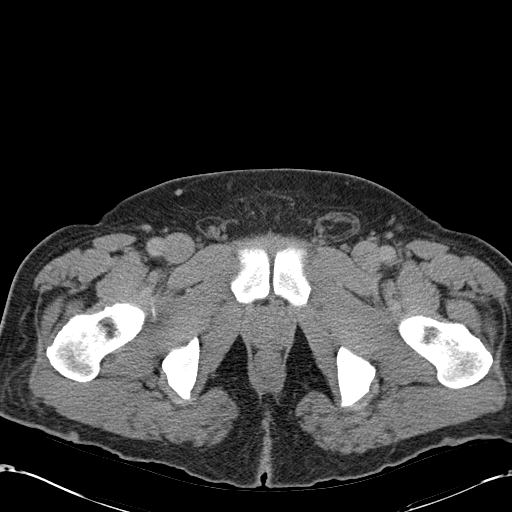
[im 23/98  soft-tissue]
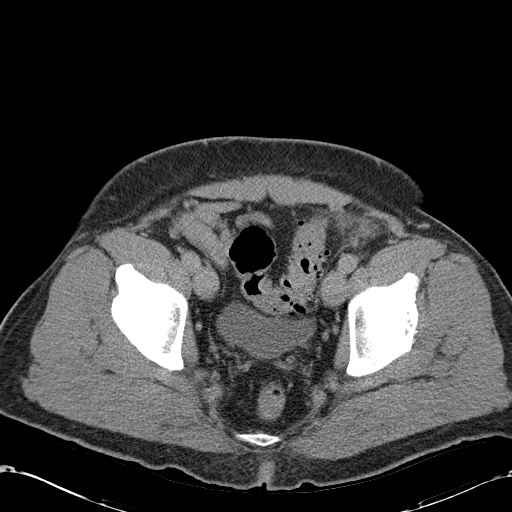
[im 29/98  soft-tissue]
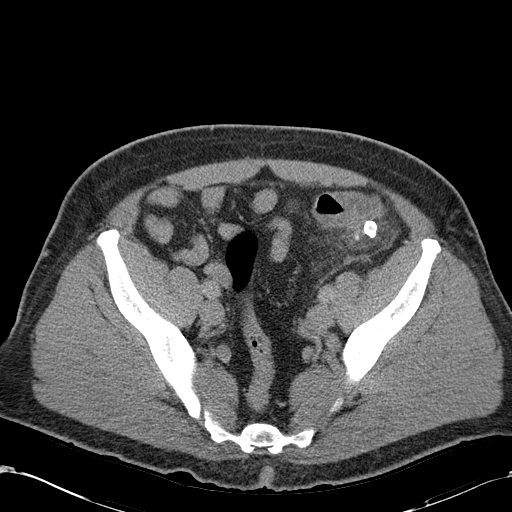
[im 35/98  soft-tissue]
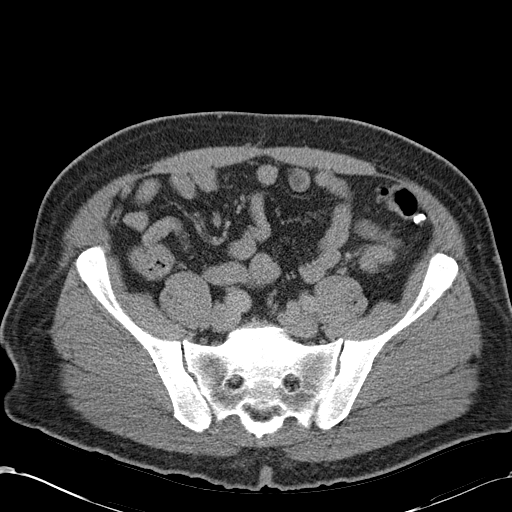
[im 40/98  soft-tissue]
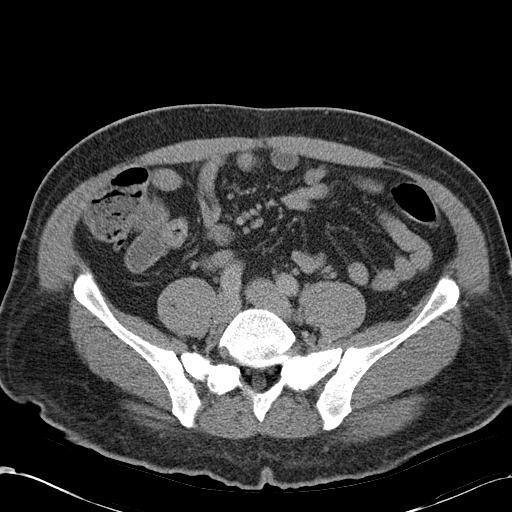
[im 52/98  soft-tissue]
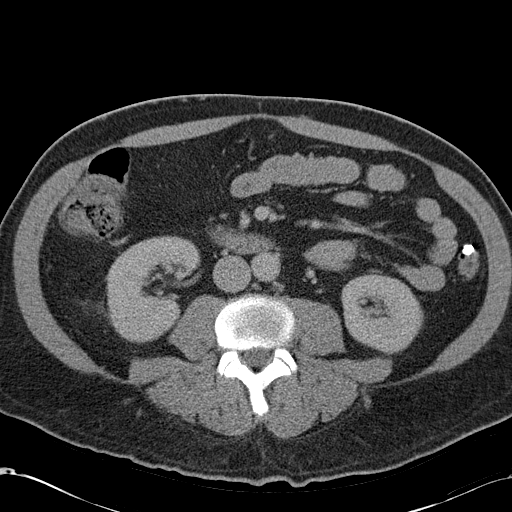
[im 58/98  soft-tissue]
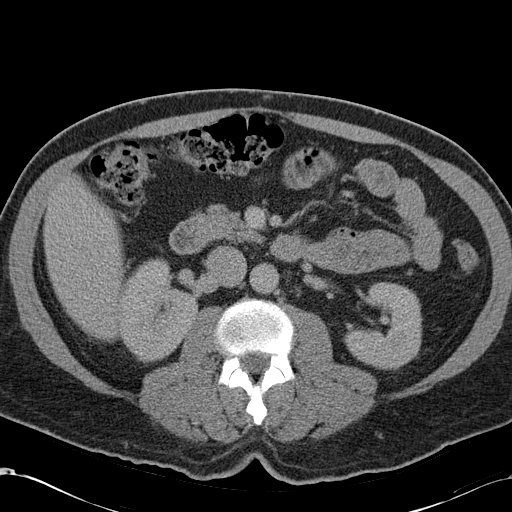
[im 63/98  soft-tissue]
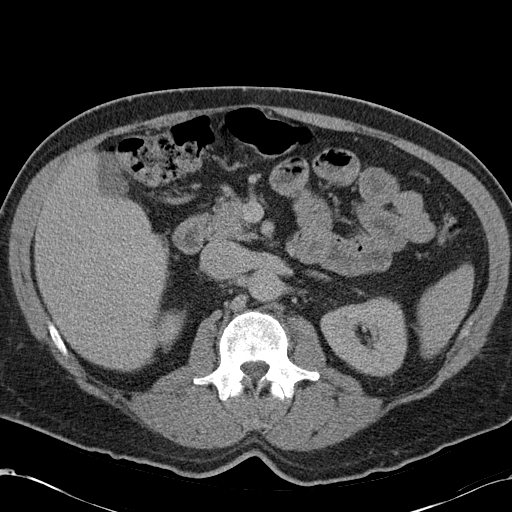
[im 63/98  bone]
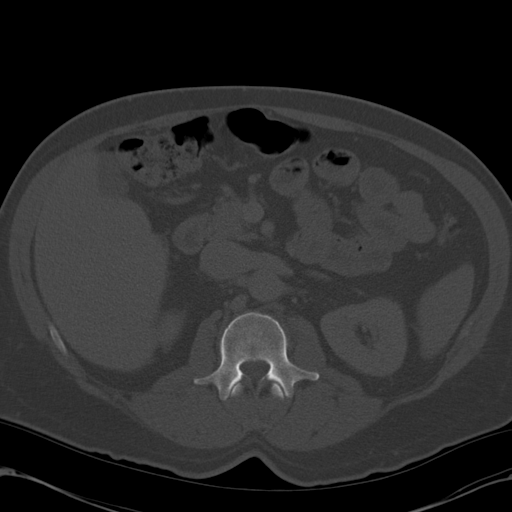
[im 69/98  soft-tissue]
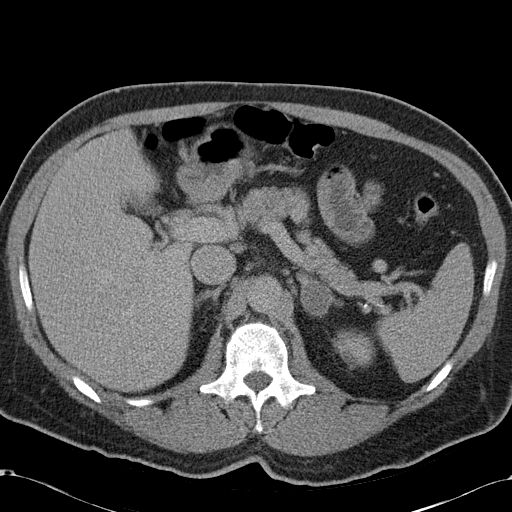
[im 75/98  soft-tissue]
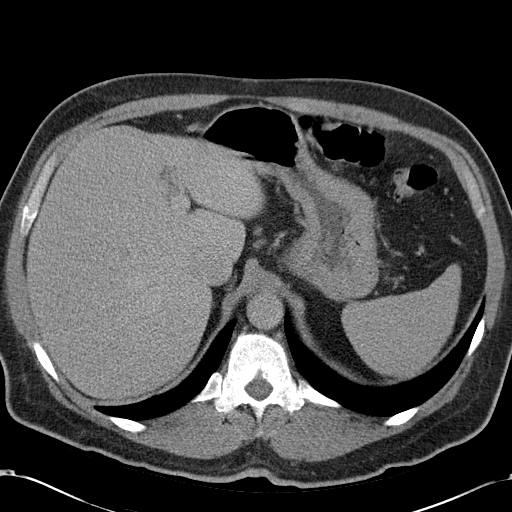
[im 86/98  soft-tissue]
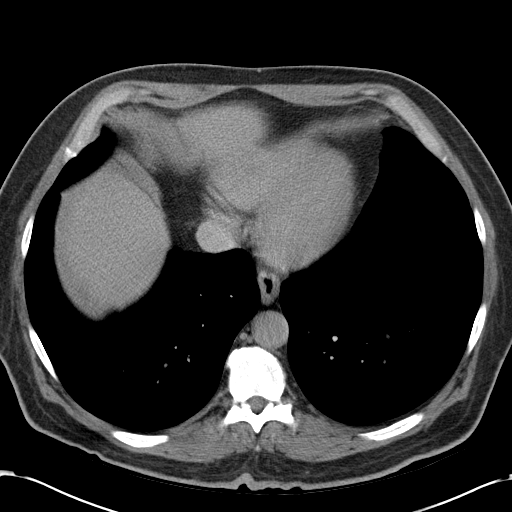
[im 92/98  soft-tissue]
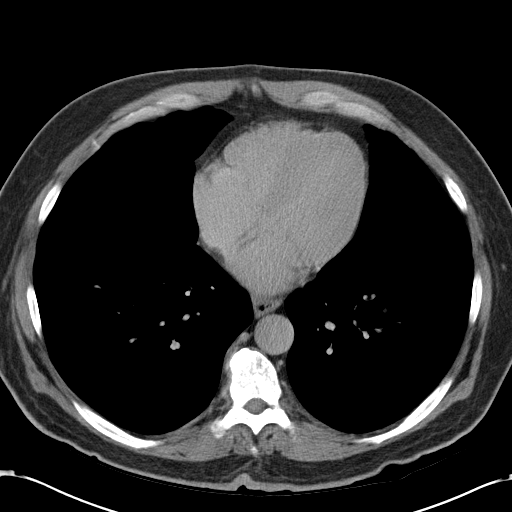

[Series 3: abd_pel_with 3.0 spo cor · coronal · 0.83mm/px · 3 of 105 slices shown]
[im 35/105  soft-tissue]
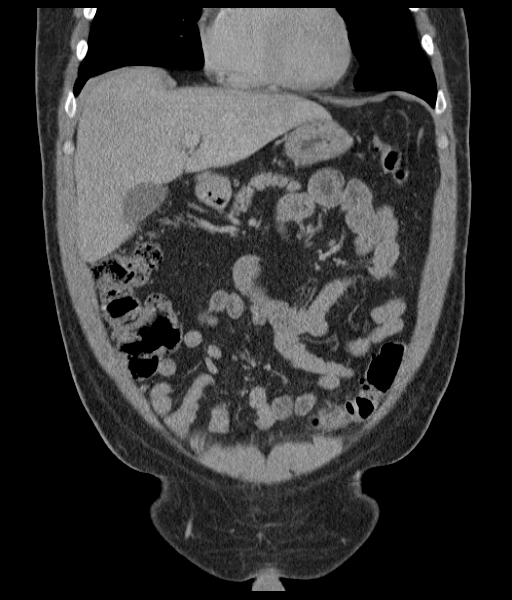
[im 47/105  soft-tissue]
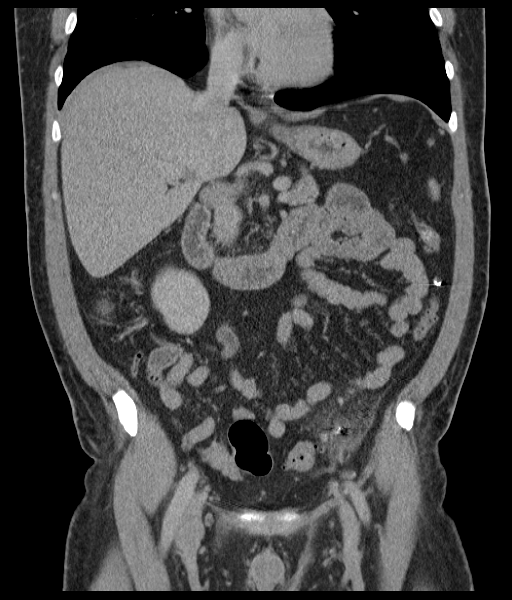
[im 58/105  soft-tissue]
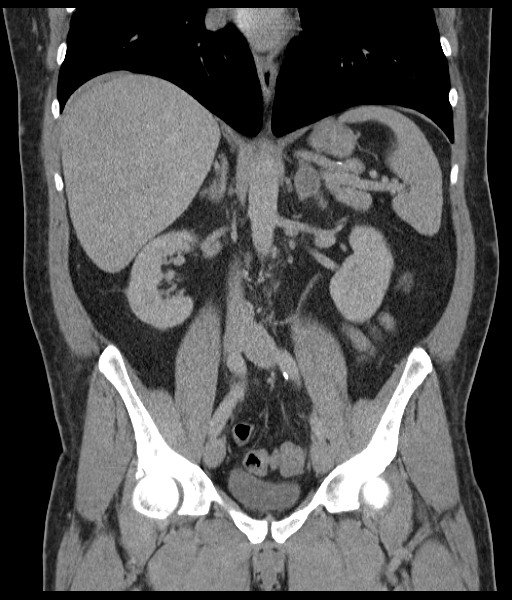

[16 of 46 positions shown; findings below may reference images not displayed]

FINDINGS: Lower chest:  The included lung bases are clear.

Liver: Tiny 5 mm hypodensity adjacent to the gallbladder fossa,
unchanged, too small to accurately characterize.

Hepatobiliary: Gallbladder physiologically distended, no calcified
stone.

Pancreas: No ductal dilatation or inflammation.

Spleen: Normal.

Adrenal glands: 2.1 cm left adrenal nodule consistent with adenoma,
unchanged. Right adrenal gland is normal.

Kidneys: Symmetric renal enhancement and excretion. No
hydronephrosis.

Stomach/Bowel: Acute diverticulitis involving the junction of the
descending/sigmoid colon, new from prior exam. Adjacent soft tissue
stranding and small volume free fluid, no perforation or abscess.
Additional noninflamed diverticula throughout the left colon. Many
of these diverticular contain high-density barium from prior small
bowel follow-through. Stomach physiologically distended. There are
no dilated or thickened small bowel loops. The appendix is normal.

Vascular/Lymphatic: No retroperitoneal adenopathy. Abdominal aorta
is normal in caliber. Minimal atherosclerosis without aneurysm.

Reproductive: Prostate gland normal in size.

Bladder: Physiologically distended, no wall thickening.

Other: No abscess or free air. Minimal fat in the left inguinal
canal.

Musculoskeletal: There are no acute or suspicious osseous
abnormalities. Mild degenerative change in both hips.
IMPRESSION: Acute uncomplicated diverticulitis involving the junction of the
descending/sigmoid colon. No perforation or abscess.

## 2015-11-29 ENCOUNTER — Telehealth (INDEPENDENT_AMBULATORY_CARE_PROVIDER_SITE_OTHER): Payer: Self-pay | Admitting: Internal Medicine

## 2015-11-29 MED ORDER — METRONIDAZOLE 500 MG PO TABS: 500.0000 mg | ORAL_TABLET | Freq: Two times a day (BID) | ORAL | Status: DC

## 2015-11-29 MED ORDER — CIPROFLOXACIN HCL 500 MG PO TABS: 500.0000 mg | ORAL_TABLET | Freq: Two times a day (BID) | ORAL | Status: DC

## 2015-11-29 NOTE — Telephone Encounter (Signed)
Patient's wife came called stating that his lower abdominal pain has not resolved completely, Husband are concerned that diverticulitis has not resolved. Therefore Cipro and metronidazole prescription needed for another 2 weeks.

## 2015-11-30 NOTE — Telephone Encounter (Signed)
Prescription for Cipro and Flagyl sent to his pharmacy yesterday.

## 2015-11-30 NOTE — Telephone Encounter (Signed)
Prescriptions for Cipro , Flagyl  For 2 more weeks have been called in to [facility]/[place]/Pharmacist voicemail.

## 2015-12-14 ENCOUNTER — Encounter (INDEPENDENT_AMBULATORY_CARE_PROVIDER_SITE_OTHER): Payer: Self-pay | Admitting: Internal Medicine

## 2015-12-14 ENCOUNTER — Ambulatory Visit (INDEPENDENT_AMBULATORY_CARE_PROVIDER_SITE_OTHER): Payer: 59 | Admitting: Internal Medicine

## 2015-12-14 VITALS — BP 140/90 | HR 78 | Temp 97.8°F | Resp 18 | Ht 70.0 in | Wt 204.3 lb

## 2015-12-14 DIAGNOSIS — K5732 Diverticulitis of large intestine without perforation or abscess without bleeding: Secondary | ICD-10-CM

## 2015-12-14 NOTE — Patient Instructions (Signed)
Patient will call with results of blood test when completed

## 2015-12-14 NOTE — Progress Notes (Signed)
Presenting complaint;  Follow-up for abdominal pain and diverticulitis.  Database and Subjective:  Gordon Richards is 54 year old Caucasian male who is here for scheduled visit accompanied by his wife came. Is presently less began in June 2016 when he started to have excruciating episodic abdominal pain with nausea and vomiting leading to multiple ER visits. Evaluation revealed mild leukocytosis but abdominopelvic CTs were negative. He had small bowel follow-through on 10/16/2015 and this study was normal. He had colonoscopy on 10/26/2015 revealing mild sigmoid colon diverticulosis and external hemorrhoids. He did not respond to anti-spasmodic therapy. He presented to emergency room 1 month ago with pain in left lower quadrant of his abdomen and he was diagnosed with uncomplicated diverticulitis at junction of descending and sigmoid colon and he was begun on Cipro and Flagyl. He was still having pain after 2 weeks of therapy and therefore therapy was continued for another 2 weeks. He feels much better. He has mild pain if he uses lower abdominal muscles. He believes he has had diverticulitis all along. I showed him CT from June and October to prove the point that he did not have diverticulitis until 11/14/2015. He has increased intake of fiber rich foods. He has lost another 10 pounds in 2 months. His goal is to get down to 175 pounds.   Current Medications: Outpatient Encounter Prescriptions as of 12/14/2015  Medication Sig  . aspirin EC 81 MG tablet Take 1 tablet (81 mg total) by mouth daily.  . vitamin C (ASCORBIC ACID) 500 MG tablet Take 2,000 mg by mouth daily.   . [DISCONTINUED] ciprofloxacin (CIPRO) 500 MG tablet Take 1 tablet (500 mg total) by mouth 2 (two) times daily. (Patient not taking: Reported on 12/14/2015)  . [DISCONTINUED] dicyclomine (BENTYL) 20 MG tablet Take 0.5 tablets (10 mg total) by mouth 3 (three) times daily before meals. (Patient not taking: Reported on 12/14/2015)  .  [DISCONTINUED] Fish Oil-Cholecalciferol (FISH OIL + D3 PO) Take 1 capsule by mouth daily. Reported on 12/14/2015  . [DISCONTINUED] HYDROcodone-acetaminophen (NORCO/VICODIN) 5-325 MG per tablet Take 2 tablets by mouth every 4 (four) hours as needed for moderate pain. (Patient not taking: Reported on 10/20/2015)  . [DISCONTINUED] metroNIDAZOLE (FLAGYL) 500 MG tablet Take 1 tablet (500 mg total) by mouth 2 (two) times daily. (Patient not taking: Reported on 12/14/2015)  . [DISCONTINUED] ondansetron (ZOFRAN ODT) 4 MG disintegrating tablet 4mg  ODT q4 hours prn nausea/vomit (Patient not taking: Reported on 12/14/2015)  . [DISCONTINUED] oxyCODONE-acetaminophen (PERCOCET/ROXICET) 5-325 MG tablet Take 1 tablet by mouth every 6 (six) hours as needed. (Patient not taking: Reported on 12/14/2015)  . [DISCONTINUED] pantoprazole (PROTONIX) 40 MG tablet Take 1 tablet (40 mg total) by mouth daily before breakfast. (Patient not taking: Reported on 12/14/2015)   No facility-administered encounter medications on file as of 12/14/2015.     Objective: Blood pressure 140/90, pulse 78, temperature 97.8 F (36.6 C), temperature source Oral, resp. rate 18, height 5\' 10"  (1.778 m), weight 204 lb 4.8 oz (92.67 kg). Patient is alert and in no acute distress. Conjunctiva is pink. Sclera is nonicteric Oropharyngeal mucosa is normal. No neck masses or thyromegaly noted. Cardiac exam with regular rhythm normal S1 and S2. No murmur or gallop noted. Lungs are clear to auscultation. Abdomen is symmetrical. Bowel sounds are normal. On palpation abdomen is soft with mild tenderness at LLQ on deep palpation. No guarding organomegaly or masses noted. No LE edema or clubbing noted.  Labs/studies Results: CT images from 06/13/2015, 10/09/2015 and 11/14/2015 reviewed with patient.  Assessment:  #1. History of diverticulitis involving junction of descending and sigmoid colon. He was diagnosed one month ago when he presented to  emergency room. He has finished 4 weeks of antibiotic therapy yesterday and feels much better. He has minimal tenderness. He could have irritable bowel syndrome as well. #2. History of intermittent abdominal pain which appears to be separate issue and most likely due to intermittent small bowel obstruction. I do not believe prior episodes of pain that he had in June and subsequent months was due to over to colitis. Small bowel follow-through was within normal limits. He had colonoscopy on 10/26/2015 revealing mild sigmoid colon diverticulosis.    Plan:  Patient will go to the lab for CBC to document that leukocytosis has resolved. Patient advised increase intake of fiber rich foods. Patient encouraged to increase calorie intake and refrain from losing more weight. Patient will call if pain recurs.

## 2015-12-15 LAB — CBC
HCT: 44.5 % (ref 39.0–52.0)
Hemoglobin: 14.7 g/dL (ref 13.0–17.0)
MCH: 29 pg (ref 26.0–34.0)
MCHC: 33 g/dL (ref 30.0–36.0)
MCV: 87.8 fL (ref 78.0–100.0)
MPV: 11.3 fL (ref 8.6–12.4)
PLATELETS: 203 10*3/uL (ref 150–400)
RBC: 5.07 MIL/uL (ref 4.22–5.81)
RDW: 14.2 % (ref 11.5–15.5)
WBC: 10 10*3/uL (ref 4.0–10.5)

## 2016-08-09 ENCOUNTER — Emergency Department (HOSPITAL_COMMUNITY)
Admission: EM | Admit: 2016-08-09 | Discharge: 2016-08-09 | Disposition: A | Discharge status: Home / Self Care | Payer: 59 | Attending: Dermatology | Admitting: Dermatology

## 2016-08-09 DIAGNOSIS — Z7982 Long term (current) use of aspirin: Secondary | ICD-10-CM | POA: Insufficient documentation

## 2016-08-09 DIAGNOSIS — Z5321 Procedure and treatment not carried out due to patient leaving prior to being seen by health care provider: Secondary | ICD-10-CM | POA: Diagnosis not present

## 2016-08-09 DIAGNOSIS — R109 Unspecified abdominal pain: Secondary | ICD-10-CM | POA: Insufficient documentation

## 2016-08-09 DIAGNOSIS — F17210 Nicotine dependence, cigarettes, uncomplicated: Secondary | ICD-10-CM | POA: Insufficient documentation

## 2016-10-25 DIAGNOSIS — Z0289 Encounter for other administrative examinations: Secondary | ICD-10-CM

## 2016-12-01 ENCOUNTER — Emergency Department (HOSPITAL_COMMUNITY)
Admission: EM | Admit: 2016-12-01 | Discharge: 2016-12-01 | Disposition: A | Discharge status: Home / Self Care | Payer: 59 | Attending: Emergency Medicine | Admitting: Emergency Medicine

## 2016-12-01 ENCOUNTER — Encounter (HOSPITAL_COMMUNITY): Payer: Self-pay | Admitting: Emergency Medicine

## 2016-12-01 DIAGNOSIS — F17210 Nicotine dependence, cigarettes, uncomplicated: Secondary | ICD-10-CM | POA: Diagnosis not present

## 2016-12-01 DIAGNOSIS — Z7982 Long term (current) use of aspirin: Secondary | ICD-10-CM | POA: Diagnosis not present

## 2016-12-01 DIAGNOSIS — F1290 Cannabis use, unspecified, uncomplicated: Secondary | ICD-10-CM | POA: Insufficient documentation

## 2016-12-01 DIAGNOSIS — R112 Nausea with vomiting, unspecified: Secondary | ICD-10-CM | POA: Insufficient documentation

## 2016-12-01 DIAGNOSIS — R109 Unspecified abdominal pain: Secondary | ICD-10-CM | POA: Insufficient documentation

## 2016-12-01 DIAGNOSIS — R1032 Left lower quadrant pain: Secondary | ICD-10-CM | POA: Diagnosis present

## 2016-12-01 DIAGNOSIS — F419 Anxiety disorder, unspecified: Secondary | ICD-10-CM | POA: Insufficient documentation

## 2016-12-01 LAB — CBC WITH DIFFERENTIAL/PLATELET
Basophils Absolute: 0 10*3/uL (ref 0.0–0.1)
Basophils Relative: 0 %
Eosinophils Absolute: 0.1 10*3/uL (ref 0.0–0.7)
Eosinophils Relative: 0 %
HCT: 48.2 % (ref 39.0–52.0)
Hemoglobin: 16.3 g/dL (ref 13.0–17.0)
Lymphocytes Relative: 11 %
Lymphs Abs: 1.3 10*3/uL (ref 0.7–4.0)
MCH: 30.8 pg (ref 26.0–34.0)
MCHC: 33.8 g/dL (ref 30.0–36.0)
MCV: 90.9 fL (ref 78.0–100.0)
Monocytes Absolute: 0.4 10*3/uL (ref 0.1–1.0)
Monocytes Relative: 3 %
Neutro Abs: 10.6 10*3/uL — ABNORMAL HIGH (ref 1.7–7.7)
Neutrophils Relative %: 86 %
Platelets: 225 10*3/uL (ref 150–400)
RBC: 5.3 MIL/uL (ref 4.22–5.81)
RDW: 13.3 % (ref 11.5–15.5)
WBC: 12.5 10*3/uL — ABNORMAL HIGH (ref 4.0–10.5)

## 2016-12-01 LAB — COMPREHENSIVE METABOLIC PANEL
ALT: 44 U/L (ref 17–63)
AST: 29 U/L (ref 15–41)
Albumin: 4.6 g/dL (ref 3.5–5.0)
Alkaline Phosphatase: 55 U/L (ref 38–126)
Anion gap: 10 (ref 5–15)
BUN: 17 mg/dL (ref 6–20)
CO2: 22 mmol/L (ref 22–32)
Calcium: 9.8 mg/dL (ref 8.9–10.3)
Chloride: 107 mmol/L (ref 101–111)
Creatinine, Ser: 0.83 mg/dL (ref 0.61–1.24)
GFR calc Af Amer: >60 mL/min (ref >60)
GFR calc non Af Amer: >60 mL/min (ref >60)
Glucose, Bld: 167 mg/dL — ABNORMAL HIGH (ref 65–99)
Potassium: 3.9 mmol/L (ref 3.5–5.1)
Sodium: 139 mmol/L (ref 135–145)
Total Bilirubin: 0.6 mg/dL (ref 0.3–1.2)
Total Protein: 7.7 g/dL (ref 6.5–8.1)

## 2016-12-01 LAB — LIPASE, BLOOD: Lipase: 24 U/L (ref 11–51)

## 2016-12-01 MED ORDER — HYDROMORPHONE HCL 1 MG/ML IJ SOLN
1.0000 mg | Freq: Once | INTRAMUSCULAR | Status: AC
  Administered 2016-12-01: 1 mg via INTRAVENOUS
  Filled 2016-12-01: qty 1

## 2016-12-01 MED ORDER — PROMETHAZINE HCL 25 MG/ML IJ SOLN
12.5000 mg | Freq: Once | INTRAMUSCULAR | Status: AC
  Administered 2016-12-01: 12.5 mg via INTRAVENOUS
  Filled 2016-12-01: qty 1

## 2016-12-01 MED ORDER — HALOPERIDOL LACTATE 5 MG/ML IJ SOLN
2.0000 mg | Freq: Once | INTRAMUSCULAR | Status: AC
  Administered 2016-12-01: 2 mg via INTRAVENOUS
  Filled 2016-12-01: qty 1

## 2016-12-01 MED ORDER — SODIUM CHLORIDE 0.9 % IV BOLUS (SEPSIS)
1000.0000 mL | Freq: Once | INTRAVENOUS | Status: AC
Start: 2016-12-01 — End: 2016-12-01
  Administered 2016-12-01: 1000 mL via INTRAVENOUS

## 2016-12-01 NOTE — ED Notes (Signed)
Patient stating that he is in pain purposefully rolled off of stretcher onto floor. Patient caught himself and lowered himself to floor stating that being on floor made him feel better. Patient stated he would not talk to me or let me assess him. I asked patient to get back in bed. Patient refuesed and told me he would not allow me to touch him. This nurse told him that treatment could not and would not be done until he cooperated with staff.

## 2016-12-01 NOTE — ED Notes (Signed)
Pt refused to complete his triage.  Would not remain in room or sit down.  Refused to answer any questions until he got a shot of Dilaudid.  Cursing at myself and his wife.  Refused vital signs.  States he does not need anything but Dilaudid and when he gets that he will cooperate.

## 2016-12-01 NOTE — ED Triage Notes (Signed)
Per wife, pt has been vomiting and c/o abd pain all night.

## 2016-12-01 NOTE — ED Notes (Signed)
Entered patient's room to remove IV. Patient verbally abusive. This nurse will allow patient's wife who is an OR nurse at this facility to contact Dr. Shonna Chock before removing IV.

## 2016-12-01 NOTE — ED Provider Notes (Signed)
[facility] DEPT Provider Note   CSN: [id number] Arrival date & time: 12/01/16  [id number]  By signing my name below, I, Rayna Sexton, attest that this documentation has been prepared under the direction and in the presence of Virgel Manifold, MD. Electronically Signed: Rayna Sexton, ED Scribe. 12/01/16. 10:06 AM.   History   Chief Complaint Chief Complaint  Patient presents with  . Abdominal Pain    HPI HPI Comments: Gordon Richards is a 55 y.o. male with a h/o diverticulitis and diverticulosis who presents to the Emergency Department complaining of persistent, moderate, n/v x 8 hours. He reports associated, moderate, LLQ pain. He states his pain is consistent with prior exacerbations. Pt became agitated during the exam and would not provide clear answers to the provider's questions.   The history is provided by the patient, medical records and the spouse. No language interpreter was used.    History reviewed. No pertinent past medical history.  There are no active problems to display for this patient.   Past Surgical History:  Procedure Laterality Date  . COLONOSCOPY WITH PROPOFOL N/A 10/26/2015   Procedure: COLONOSCOPY WITH PROPOFOL;  Surgeon: [name], MD;  Location: [facility];  Service: Endoscopy;  Laterality: N/A;  Cecum time in  0748  time out  0758  total time 10 minutes  . ESOPHAGOGASTRODUODENOSCOPY N/A 10/09/2015   Procedure: ESOPHAGOGASTRODUODENOSCOPY (EGD);  Surgeon: [name], MD;  Location: [facility];  Service: Endoscopy;  Laterality: N/A;  . FOOT SURGERY Right    bone cutting  . HEMORROIDECTOMY  2003  . LIPOMA EXCISION Right 11/08/2013   Procedure: EXCISION NEOPLASM RIGHT ARM;  Surgeon: [name], MD;  Location: [facility];  Service: General;  Laterality: Right;  . MASS EXCISION Right 11/08/2013   Procedure: EXCISION NEOPLASM SCROTUM;  Surgeon: [name], MD;  Location: [facility];  Service: General;  Laterality: Right;       Home Medications     Prior to Admission medications   Medication Sig Start Date End Date Taking? Authorizing Provider  aspirin EC 81 MG tablet Take 1 tablet (81 mg total) by mouth daily. 10/09/15   [name], MD  vitamin C (ASCORBIC ACID) 500 MG tablet Take 2,000 mg by mouth daily.     Historical Provider, MD    Family History History reviewed. No pertinent family history.  Social History Social History  Substance Use Topics  . Smoking status: Current Every Day Smoker    Packs/day: 1.00    Years: 5.00    Types: Cigarettes  . Smokeless tobacco: Never Used  . Alcohol use 0.0 oz/week     Comment: rarely     Allergies   Patient has no known allergies.   Review of Systems Review of Systems  Gastrointestinal: Positive for abdominal pain, nausea and vomiting.  All other systems reviewed and are negative.  Physical Exam Updated Vital Signs There were no vitals taken for this visit.  Physical Exam  Constitutional: He is oriented to person, place, and time. He appears well-developed.  HENT:  Head: Normocephalic.  Eyes: EOM are normal.  Neck: Normal range of motion.  Pulmonary/Chest: Effort normal.  Musculoskeletal: Normal range of motion.  Neurological: He is alert and oriented to person, place, and time.  Psychiatric: His mood appears anxious. His affect is angry and inappropriate.  Pt is pacing the room, agitated and lying on the floor  Nursing note and vitals reviewed.   ED Treatments / Results  Labs (  all labs ordered are listed, but only abnormal results are displayed) Labs Reviewed - No data to display  EKG  EKG Interpretation None       Radiology No results found.  Procedures Procedures  COORDINATION OF CARE: 10:04 AM Attempted to discuss the pt's complaint. He became agitated and yelled and would not provide further details regarding his complaint.   Medications Ordered in ED Medications - No data to display   Initial Impression / Assessment and Plan / ED  Course  I have reviewed the triage vital signs and the nursing notes.  Pertinent labs & imaging results that were available during my care of the patient were reviewed by me and considered in my medical decision making (see chart for details).  Clinical Course    55 year old male with abdominal pain. He might be having significant pain but his behavior in the emergency room is completely unacceptable. He has been disrespectful to his wife and rude to multiple staff members. He does have some tenderness on exam but I have a pretty low suspicion for an emergent intra-abdominal process. Given how uncooperative he is with trying to treat him he was asked to leave the emergency room.  Final diagnoses:  Abdominal pain, unspecified abdominal location    New Prescriptions New Prescriptions   No medications on file     Virgel Manifold, MD 12/04/16 [phone]

## 2016-12-01 NOTE — ED Notes (Signed)
Patient become verbally combative while administering medication. Patient threw clothing around room and demand that his wife pick up after him. Patient then explained to this nurse that it was perfectly alright for him to treat staff at this hospital abusively.

## 2020-05-07 ENCOUNTER — Emergency Department (HOSPITAL_COMMUNITY): Payer: 59

## 2020-05-07 ENCOUNTER — Inpatient Hospital Stay (HOSPITAL_COMMUNITY): Payer: 59

## 2020-05-07 ENCOUNTER — Encounter (HOSPITAL_COMMUNITY)
Admission: EM | Disposition: A | Discharge status: Home / Self Care | Payer: Self-pay | Source: Home / Self Care | Attending: Cardiology

## 2020-05-07 ENCOUNTER — Encounter (HOSPITAL_COMMUNITY): Payer: Self-pay | Admitting: Emergency Medicine

## 2020-05-07 ENCOUNTER — Other Ambulatory Visit: Payer: Self-pay

## 2020-05-07 ENCOUNTER — Inpatient Hospital Stay (HOSPITAL_COMMUNITY)
Admission: EM | Admit: 2020-05-07 | Discharge: 2020-05-09 | DRG: 247 | Disposition: A | Discharge status: Home / Self Care | Payer: 59 | Attending: Internal Medicine | Admitting: Internal Medicine

## 2020-05-07 DIAGNOSIS — E7849 Other hyperlipidemia: Secondary | ICD-10-CM | POA: Diagnosis present

## 2020-05-07 DIAGNOSIS — I25110 Atherosclerotic heart disease of native coronary artery with unstable angina pectoris: Secondary | ICD-10-CM | POA: Diagnosis present

## 2020-05-07 DIAGNOSIS — Z7982 Long term (current) use of aspirin: Secondary | ICD-10-CM

## 2020-05-07 DIAGNOSIS — R0902 Hypoxemia: Secondary | ICD-10-CM | POA: Diagnosis not present

## 2020-05-07 DIAGNOSIS — Z20822 Contact with and (suspected) exposure to covid-19: Secondary | ICD-10-CM | POA: Diagnosis present

## 2020-05-07 DIAGNOSIS — R0789 Other chest pain: Secondary | ICD-10-CM | POA: Diagnosis not present

## 2020-05-07 DIAGNOSIS — E785 Hyperlipidemia, unspecified: Secondary | ICD-10-CM | POA: Diagnosis present

## 2020-05-07 DIAGNOSIS — I2119 ST elevation (STEMI) myocardial infarction involving other coronary artery of inferior wall: Secondary | ICD-10-CM

## 2020-05-07 DIAGNOSIS — Z79899 Other long term (current) drug therapy: Secondary | ICD-10-CM

## 2020-05-07 DIAGNOSIS — I25709 Atherosclerosis of coronary artery bypass graft(s), unspecified, with unspecified angina pectoris: Secondary | ICD-10-CM | POA: Diagnosis present

## 2020-05-07 DIAGNOSIS — I071 Rheumatic tricuspid insufficiency: Secondary | ICD-10-CM | POA: Diagnosis present

## 2020-05-07 DIAGNOSIS — Z9861 Coronary angioplasty status: Secondary | ICD-10-CM | POA: Diagnosis not present

## 2020-05-07 DIAGNOSIS — F17210 Nicotine dependence, cigarettes, uncomplicated: Secondary | ICD-10-CM | POA: Diagnosis present

## 2020-05-07 DIAGNOSIS — Z6841 Body Mass Index (BMI) 40.0 and over, adult: Secondary | ICD-10-CM

## 2020-05-07 DIAGNOSIS — I10 Essential (primary) hypertension: Secondary | ICD-10-CM | POA: Diagnosis present

## 2020-05-07 DIAGNOSIS — I25111 Atherosclerotic heart disease of native coronary artery with angina pectoris with documented spasm: Secondary | ICD-10-CM | POA: Insufficient documentation

## 2020-05-07 DIAGNOSIS — Z720 Tobacco use: Secondary | ICD-10-CM | POA: Diagnosis not present

## 2020-05-07 DIAGNOSIS — I213 ST elevation (STEMI) myocardial infarction of unspecified site: Secondary | ICD-10-CM | POA: Insufficient documentation

## 2020-05-07 DIAGNOSIS — I2510 Atherosclerotic heart disease of native coronary artery without angina pectoris: Secondary | ICD-10-CM | POA: Diagnosis present

## 2020-05-07 DIAGNOSIS — E876 Hypokalemia: Secondary | ICD-10-CM | POA: Diagnosis present

## 2020-05-07 DIAGNOSIS — R079 Chest pain, unspecified: Secondary | ICD-10-CM | POA: Diagnosis not present

## 2020-05-07 DIAGNOSIS — I2111 ST elevation (STEMI) myocardial infarction involving right coronary artery: Secondary | ICD-10-CM

## 2020-05-07 DIAGNOSIS — I219 Acute myocardial infarction, unspecified: Secondary | ICD-10-CM | POA: Diagnosis not present

## 2020-05-07 DIAGNOSIS — I499 Cardiac arrhythmia, unspecified: Secondary | ICD-10-CM | POA: Diagnosis not present

## 2020-05-07 HISTORY — PX: LEFT HEART CATH AND CORONARY ANGIOGRAPHY: CATH118249

## 2020-05-07 HISTORY — PX: TRANSTHORACIC ECHOCARDIOGRAM: SHX275

## 2020-05-07 HISTORY — PX: CORONARY/GRAFT ACUTE MI REVASCULARIZATION: CATH118305

## 2020-05-07 HISTORY — DX: ST elevation (STEMI) myocardial infarction involving other coronary artery of inferior wall: I21.19

## 2020-05-07 LAB — TROPONIN I (HIGH SENSITIVITY)
Troponin I (High Sensitivity): 1150 ng/L (ref <18)
Troponin I (High Sensitivity): 11304 ng/L (ref <18)
Troponin I (High Sensitivity): 16736 ng/L (ref <18)
Troponin I (High Sensitivity): 18662 ng/L (ref <18)

## 2020-05-07 LAB — CBC WITH DIFFERENTIAL/PLATELET
Abs Immature Granulocytes: 0.06 10*3/uL (ref 0.00–0.07)
Abs Immature Granulocytes: 0.04 10*3/uL (ref 0.00–0.07)
Basophils Absolute: 0.1 10*3/uL (ref 0.0–0.1)
Basophils Absolute: 0.1 10*3/uL (ref 0.0–0.1)
Basophils Relative: 1 %
Basophils Relative: 1 %
Eosinophils Absolute: 0.4 10*3/uL (ref 0.0–0.5)
Eosinophils Absolute: 0.1 10*3/uL (ref 0.0–0.5)
Eosinophils Relative: 3 %
Eosinophils Relative: 1 %
HCT: 50 % (ref 39.0–52.0)
HCT: 48.4 % (ref 39.0–52.0)
Hemoglobin: 16.5 g/dL (ref 13.0–17.0)
Hemoglobin: 16.2 g/dL (ref 13.0–17.0)
Immature Granulocytes: 1 %
Immature Granulocytes: 0 %
Lymphocytes Relative: 28 %
Lymphocytes Relative: 16 %
Lymphs Abs: 3.3 10*3/uL (ref 0.7–4.0)
Lymphs Abs: 1.9 10*3/uL (ref 0.7–4.0)
MCH: 30.2 pg (ref 26.0–34.0)
MCH: 29.6 pg (ref 26.0–34.0)
MCHC: 33 g/dL (ref 30.0–36.0)
MCHC: 33.5 g/dL (ref 30.0–36.0)
MCV: 91.4 fL (ref 80.0–100.0)
MCV: 88.5 fL (ref 80.0–100.0)
Monocytes Absolute: 1.3 10*3/uL — ABNORMAL HIGH (ref 0.1–1.0)
Monocytes Absolute: 1.1 10*3/uL — ABNORMAL HIGH (ref 0.1–1.0)
Monocytes Relative: 11 %
Monocytes Relative: 9 %
Neutro Abs: 7 10*3/uL (ref 1.7–7.7)
Neutro Abs: 8.7 10*3/uL — ABNORMAL HIGH (ref 1.7–7.7)
Neutrophils Relative %: 56 %
Neutrophils Relative %: 73 %
Platelets: 221 10*3/uL (ref 150–400)
Platelets: 237 10*3/uL (ref 150–400)
RBC: 5.47 MIL/uL (ref 4.22–5.81)
RBC: 5.47 MIL/uL (ref 4.22–5.81)
RDW: 12.7 % (ref 11.5–15.5)
RDW: 12.7 % (ref 11.5–15.5)
WBC: 12.2 10*3/uL — ABNORMAL HIGH (ref 4.0–10.5)
WBC: 11.9 10*3/uL — ABNORMAL HIGH (ref 4.0–10.5)
nRBC: 0 % (ref 0.0–0.2)
nRBC: 0 % (ref 0.0–0.2)

## 2020-05-07 LAB — COMPREHENSIVE METABOLIC PANEL
ALT: 47 U/L — ABNORMAL HIGH (ref 0–44)
ALT: 49 U/L — ABNORMAL HIGH (ref 0–44)
AST: 37 U/L (ref 15–41)
AST: 68 U/L — ABNORMAL HIGH (ref 15–41)
Albumin: 4.3 g/dL (ref 3.5–5.0)
Albumin: 4 g/dL (ref 3.5–5.0)
Alkaline Phosphatase: 62 U/L (ref 38–126)
Alkaline Phosphatase: 56 U/L (ref 38–126)
Anion gap: 11 (ref 5–15)
Anion gap: 14 (ref 5–15)
BUN: 20 mg/dL (ref 6–20)
BUN: 15 mg/dL (ref 6–20)
CO2: 26 mmol/L (ref 22–32)
CO2: 24 mmol/L (ref 22–32)
Calcium: 9 mg/dL (ref 8.9–10.3)
Calcium: 9 mg/dL (ref 8.9–10.3)
Chloride: 101 mmol/L (ref 98–111)
Chloride: 101 mmol/L (ref 98–111)
Creatinine, Ser: 0.85 mg/dL (ref 0.61–1.24)
Creatinine, Ser: 0.78 mg/dL (ref 0.61–1.24)
GFR calc Af Amer: >60 mL/min (ref >60)
GFR calc Af Amer: >60 mL/min (ref >60)
GFR calc non Af Amer: >60 mL/min (ref >60)
GFR calc non Af Amer: >60 mL/min (ref >60)
Glucose, Bld: 142 mg/dL — ABNORMAL HIGH (ref 70–99)
Glucose, Bld: 137 mg/dL — ABNORMAL HIGH (ref 70–99)
Potassium: 3.2 mmol/L — ABNORMAL LOW (ref 3.5–5.1)
Potassium: 3.4 mmol/L — ABNORMAL LOW (ref 3.5–5.1)
Sodium: 138 mmol/L (ref 135–145)
Sodium: 139 mmol/L (ref 135–145)
Total Bilirubin: 0.6 mg/dL (ref 0.3–1.2)
Total Bilirubin: 0.6 mg/dL (ref 0.3–1.2)
Total Protein: 7.5 g/dL (ref 6.5–8.1)
Total Protein: 7 g/dL (ref 6.5–8.1)

## 2020-05-07 LAB — APTT
aPTT: 27 seconds (ref 24–36)
aPTT: 46 seconds — ABNORMAL HIGH (ref 24–36)

## 2020-05-07 LAB — LIPID PANEL
Cholesterol: 215 mg/dL — ABNORMAL HIGH (ref 0–200)
Cholesterol: 203 mg/dL — ABNORMAL HIGH (ref 0–200)
HDL: 32 mg/dL — ABNORMAL LOW (ref >40)
HDL: 33 mg/dL — ABNORMAL LOW (ref >40)
LDL Cholesterol: 150 mg/dL — ABNORMAL HIGH (ref 0–99)
LDL Cholesterol: 148 mg/dL — ABNORMAL HIGH (ref 0–99)
Total CHOL/HDL Ratio: 6.7 RATIO
Total CHOL/HDL Ratio: 6.2 RATIO
Triglycerides: 167 mg/dL — ABNORMAL HIGH (ref <150)
Triglycerides: 108 mg/dL (ref <150)
VLDL: 33 mg/dL (ref 0–40)
VLDL: 22 mg/dL (ref 0–40)

## 2020-05-07 LAB — TSH: TSH: 2.52 u[IU]/mL (ref 0.350–4.500)

## 2020-05-07 LAB — MAGNESIUM: Magnesium: 1.8 mg/dL (ref 1.7–2.4)

## 2020-05-07 LAB — ECHOCARDIOGRAM COMPLETE
Height: 70 in
Weight: 4712.55 oz

## 2020-05-07 LAB — POCT ACTIVATED CLOTTING TIME
Activated Clotting Time: 241 seconds
Activated Clotting Time: 279 seconds
Activated Clotting Time: 296 seconds

## 2020-05-07 LAB — HEMOGLOBIN A1C
Hgb A1c MFr Bld: 5.9 % — ABNORMAL HIGH (ref 4.8–5.6)
Hgb A1c MFr Bld: 6 % — ABNORMAL HIGH (ref 4.8–5.6)
Mean Plasma Glucose: 122.63 mg/dL
Mean Plasma Glucose: 125.5 mg/dL

## 2020-05-07 LAB — PROTIME-INR
INR: 0.9 (ref 0.8–1.2)
INR: 1 (ref 0.8–1.2)
Prothrombin Time: 11.4 seconds (ref 11.4–15.2)
Prothrombin Time: 12.4 seconds (ref 11.4–15.2)

## 2020-05-07 LAB — SARS CORONAVIRUS 2 BY RT PCR (HOSPITAL ORDER, PERFORMED IN ~~LOC~~ HOSPITAL LAB): SARS Coronavirus 2: NEGATIVE

## 2020-05-07 LAB — HIV ANTIBODY (ROUTINE TESTING W REFLEX): HIV Screen 4th Generation wRfx: NONREACTIVE

## 2020-05-07 SURGERY — CORONARY/GRAFT ACUTE MI REVASCULARIZATION
Anesthesia: LOCAL

## 2020-05-07 MED ORDER — FUROSEMIDE 10 MG/ML IJ SOLN
INTRAMUSCULAR | Status: DC | PRN
  Administered 2020-05-07: 40 mg via INTRAVENOUS

## 2020-05-07 MED ORDER — MIDAZOLAM HCL 2 MG/2ML IJ SOLN
INTRAMUSCULAR | Status: AC
  Filled 2020-05-07: qty 2

## 2020-05-07 MED ORDER — TIROFIBAN HCL IN NACL 5-0.9 MG/100ML-% IV SOLN
INTRAVENOUS | Status: AC
  Filled 2020-05-07: qty 100

## 2020-05-07 MED ORDER — SODIUM CHLORIDE 0.9 % IV SOLN: 250.0000 mL | INTRAVENOUS | Status: DC | PRN

## 2020-05-07 MED ORDER — FENTANYL CITRATE (PF) 100 MCG/2ML IJ SOLN
INTRAMUSCULAR | Status: AC
  Filled 2020-05-07: qty 2

## 2020-05-07 MED ORDER — TICAGRELOR 90 MG PO TABS
ORAL_TABLET | ORAL | Status: AC
  Filled 2020-05-07: qty 2

## 2020-05-07 MED ORDER — NITROGLYCERIN 0.4 MG SL SUBL
0.4000 mg | SUBLINGUAL_TABLET | SUBLINGUAL | Status: DC | PRN
  Administered 2020-05-07: 0.4 mg via SUBLINGUAL
  Filled 2020-05-07: qty 1

## 2020-05-07 MED ORDER — HYDRALAZINE HCL 20 MG/ML IJ SOLN
INTRAMUSCULAR | Status: AC
  Filled 2020-05-07: qty 1

## 2020-05-07 MED ORDER — HYDRALAZINE HCL 20 MG/ML IJ SOLN
INTRAMUSCULAR | Status: DC | PRN
  Administered 2020-05-07: 20 mg via INTRAVENOUS

## 2020-05-07 MED ORDER — SODIUM CHLORIDE 0.9% FLUSH: 3.0000 mL | INTRAVENOUS | Status: DC | PRN

## 2020-05-07 MED ORDER — ONDANSETRON HCL 4 MG/2ML IJ SOLN: 4.0000 mg | Freq: Four times a day (QID) | INTRAMUSCULAR | Status: DC | PRN

## 2020-05-07 MED ORDER — ACETAMINOPHEN 325 MG PO TABS: 650.0000 mg | ORAL_TABLET | ORAL | Status: DC | PRN

## 2020-05-07 MED ORDER — SODIUM CHLORIDE 0.9 % IV SOLN: INTRAVENOUS | Status: DC

## 2020-05-07 MED ORDER — VERAPAMIL HCL 2.5 MG/ML IV SOLN
INTRAVENOUS | Status: DC | PRN
  Administered 2020-05-07: 10 mL via INTRA_ARTERIAL

## 2020-05-07 MED ORDER — HEPARIN (PORCINE) IN NACL 1000-0.9 UT/500ML-% IV SOLN
INTRAVENOUS | Status: AC
  Filled 2020-05-07: qty 1000

## 2020-05-07 MED ORDER — CHLORHEXIDINE GLUCONATE CLOTH 2 % EX PADS
6.0000 | MEDICATED_PAD | Freq: Every day | CUTANEOUS | Status: DC
  Administered 2020-05-07 – 2020-05-09 (×3): 6 via TOPICAL

## 2020-05-07 MED ORDER — HEPARIN SODIUM (PORCINE) 5000 UNIT/ML IJ SOLN
4000.0000 [IU] | Freq: Once | INTRAMUSCULAR | Status: AC
  Administered 2020-05-07: 4000 [IU] via INTRAVENOUS
  Filled 2020-05-07: qty 1

## 2020-05-07 MED ORDER — SODIUM CHLORIDE 0.9 % IV SOLN: INTRAVENOUS | Status: AC

## 2020-05-07 MED ORDER — LABETALOL HCL 5 MG/ML IV SOLN: 10.0000 mg | INTRAVENOUS | Status: AC | PRN

## 2020-05-07 MED ORDER — SODIUM CHLORIDE 0.9% FLUSH
3.0000 mL | Freq: Two times a day (BID) | INTRAVENOUS | Status: DC
  Administered 2020-05-07 – 2020-05-09 (×5): 3 mL via INTRAVENOUS

## 2020-05-07 MED ORDER — HEPARIN SODIUM (PORCINE) 1000 UNIT/ML IJ SOLN
INTRAMUSCULAR | Status: AC
  Filled 2020-05-07: qty 1

## 2020-05-07 MED ORDER — TIROFIBAN (AGGRASTAT) BOLUS VIA INFUSION
INTRAVENOUS | Status: DC | PRN
  Administered 2020-05-07: 3630 ug via INTRAVENOUS

## 2020-05-07 MED ORDER — LIDOCAINE HCL (PF) 1 % IJ SOLN
INTRAMUSCULAR | Status: AC
  Filled 2020-05-07: qty 30

## 2020-05-07 MED ORDER — IOHEXOL 350 MG/ML SOLN
INTRAVENOUS | Status: DC | PRN
  Administered 2020-05-07: 225 mL via INTRA_ARTERIAL

## 2020-05-07 MED ORDER — FENTANYL CITRATE (PF) 100 MCG/2ML IJ SOLN
INTRAMUSCULAR | Status: DC | PRN
  Administered 2020-05-07: 50 ug via INTRAVENOUS
  Administered 2020-05-07 (×2): 25 ug via INTRAVENOUS

## 2020-05-07 MED ORDER — ACETAMINOPHEN 325 MG PO TABS
650.0000 mg | ORAL_TABLET | ORAL | Status: DC | PRN
  Administered 2020-05-07: 650 mg via ORAL
  Filled 2020-05-07: qty 2

## 2020-05-07 MED ORDER — CARVEDILOL 6.25 MG PO TABS
6.2500 mg | ORAL_TABLET | Freq: Two times a day (BID) | ORAL | Status: DC
  Administered 2020-05-07 – 2020-05-09 (×5): 6.25 mg via ORAL
  Filled 2020-05-07 (×5): qty 1

## 2020-05-07 MED ORDER — MIDAZOLAM HCL 2 MG/2ML IJ SOLN
INTRAMUSCULAR | Status: DC | PRN
  Administered 2020-05-07 (×2): 1 mg via INTRAVENOUS

## 2020-05-07 MED ORDER — IOHEXOL 350 MG/ML SOLN
INTRAVENOUS | Status: AC
  Filled 2020-05-07: qty 1

## 2020-05-07 MED ORDER — TICAGRELOR 90 MG PO TABS
ORAL_TABLET | ORAL | Status: DC | PRN
  Administered 2020-05-07: 180 mg via ORAL

## 2020-05-07 MED ORDER — ENOXAPARIN SODIUM 40 MG/0.4ML ~~LOC~~ SOLN
40.0000 mg | SUBCUTANEOUS | Status: DC
  Administered 2020-05-07 – 2020-05-09 (×3): 40 mg via SUBCUTANEOUS
  Filled 2020-05-07 (×3): qty 0.4

## 2020-05-07 MED ORDER — TIROFIBAN HCL IN NACL 5-0.9 MG/100ML-% IV SOLN
INTRAVENOUS | Status: AC | PRN
  Administered 2020-05-07 (×2): 0.15 ug/kg/min via INTRAVENOUS

## 2020-05-07 MED ORDER — SODIUM CHLORIDE 0.9 % IV SOLN
INTRAVENOUS | Status: AC | PRN
  Administered 2020-05-07: 100 mL/h via INTRAVENOUS

## 2020-05-07 MED ORDER — NITROGLYCERIN 1 MG/10 ML FOR IR/CATH LAB
INTRA_ARTERIAL | Status: DC | PRN
  Administered 2020-05-07 (×2): 200 ug via INTRACORONARY
  Administered 2020-05-07: 200 ug via INTRA_ARTERIAL
  Administered 2020-05-07: 200 ug via INTRACORONARY

## 2020-05-07 MED ORDER — ASPIRIN 81 MG PO CHEW
324.0000 mg | CHEWABLE_TABLET | Freq: Once | ORAL | Status: AC
  Administered 2020-05-07: 324 mg via ORAL
  Filled 2020-05-07: qty 4

## 2020-05-07 MED ORDER — VERAPAMIL HCL 2.5 MG/ML IV SOLN
INTRAVENOUS | Status: AC
  Filled 2020-05-07: qty 2

## 2020-05-07 MED ORDER — HYDRALAZINE HCL 20 MG/ML IJ SOLN: 10.0000 mg | INTRAMUSCULAR | Status: AC | PRN

## 2020-05-07 MED ORDER — HEPARIN (PORCINE) IN NACL 1000-0.9 UT/500ML-% IV SOLN
INTRAVENOUS | Status: DC | PRN
  Administered 2020-05-07 (×2): 500 mL

## 2020-05-07 MED ORDER — LIDOCAINE HCL (PF) 1 % IJ SOLN
INTRAMUSCULAR | Status: DC | PRN
  Administered 2020-05-07: 5 mL via SUBCUTANEOUS

## 2020-05-07 MED ORDER — DIAZEPAM 5 MG PO TABS
5.0000 mg | ORAL_TABLET | Freq: Three times a day (TID) | ORAL | Status: DC | PRN
  Administered 2020-05-07 (×2): 5 mg via ORAL
  Filled 2020-05-07 (×3): qty 1

## 2020-05-07 MED ORDER — ASPIRIN EC 81 MG PO TBEC
81.0000 mg | DELAYED_RELEASE_TABLET | Freq: Every day | ORAL | Status: DC
  Administered 2020-05-07 – 2020-05-09 (×3): 81 mg via ORAL
  Filled 2020-05-07 (×3): qty 1

## 2020-05-07 MED ORDER — ATORVASTATIN CALCIUM 80 MG PO TABS
80.0000 mg | ORAL_TABLET | Freq: Every day | ORAL | Status: DC
  Administered 2020-05-07 – 2020-05-09 (×3): 80 mg via ORAL
  Filled 2020-05-07 (×3): qty 1

## 2020-05-07 MED ORDER — HEPARIN SODIUM (PORCINE) 1000 UNIT/ML IJ SOLN
INTRAMUSCULAR | Status: DC | PRN
  Administered 2020-05-07: 10000 [IU] via INTRAVENOUS
  Administered 2020-05-07: 4000 [IU] via INTRAVENOUS

## 2020-05-07 MED ORDER — TICAGRELOR 90 MG PO TABS
90.0000 mg | ORAL_TABLET | Freq: Two times a day (BID) | ORAL | Status: DC
  Administered 2020-05-07 – 2020-05-09 (×5): 90 mg via ORAL
  Filled 2020-05-07 (×5): qty 1

## 2020-05-07 MED ORDER — NITROGLYCERIN 1 MG/10 ML FOR IR/CATH LAB
INTRA_ARTERIAL | Status: AC
  Filled 2020-05-07: qty 10

## 2020-05-07 SURGICAL SUPPLY — 25 items
BALLN  ~~LOC~~ SAPPHIRE 4.5X15 (BALLOONS) ×2
BALLN  ~~LOC~~ SAPPHIRE 5.0X8 (BALLOONS) ×2
BALLN SAPPHIRE 2.5X15 (BALLOONS) ×2
BALLN ~~LOC~~ SAPPHIRE 4.5X15 (BALLOONS) ×1
BALLN ~~LOC~~ SAPPHIRE 5.0X8 (BALLOONS) ×1
BALLOON SAPPHIRE 2.5X15 (BALLOONS) IMPLANT
BALLOON ~~LOC~~ SAPPHIRE 4.5X15 (BALLOONS) IMPLANT
BALLOON ~~LOC~~ SAPPHIRE 5.0X8 (BALLOONS) IMPLANT
CATH OPTITORQUE TIG 4.0 5F (CATHETERS) ×1 IMPLANT
CATH VISTA GUIDE 6FR JR4 (CATHETERS) ×1 IMPLANT
CATH VISTA GUIDE 6FR XB3.5 (CATHETERS) ×1 IMPLANT
DEVICE RAD COMP TR BAND LRG (VASCULAR PRODUCTS) ×1 IMPLANT
GLIDESHEATH SLEND SS 6F .021 (SHEATH) ×1 IMPLANT
GUIDEWIRE INQWIRE 1.5J.035X260 (WIRE) IMPLANT
INQWIRE 1.5J .035X260CM (WIRE) ×2
KIT ENCORE 26 ADVANTAGE (KITS) ×1 IMPLANT
KIT HEART LEFT (KITS) ×2 IMPLANT
PACK CARDIAC CATHETERIZATION (CUSTOM PROCEDURE TRAY) ×2 IMPLANT
SHEATH PROBE COVER 6X72 (BAG) ×1 IMPLANT
STENT RESOLUTE ONYX 2.75X15 (Permanent Stent) ×1 IMPLANT
STENT RESOLUTE ONYX 4.0X34 (Permanent Stent) ×1 IMPLANT
TRANSDUCER W/STOPCOCK (MISCELLANEOUS) ×2 IMPLANT
TUBING CIL FLEX 10 FLL-RA (TUBING) ×2 IMPLANT
WIRE ASAHI PROWATER 180CM (WIRE) ×2 IMPLANT
WIRE HI TORQ BMW 190CM (WIRE) ×1 IMPLANT

## 2020-05-07 NOTE — ED Notes (Signed)
Date and time results received: 05/07/20 0405   Test: Trop Critical Value: 1150  Name of Provider Notified: Dr. Dayna Barker  Orders Received? Or Actions Taken?: see chart

## 2020-05-07 NOTE — Progress Notes (Signed)
    Seen briefly upon arrival to CCU.  No further chest pain.  Somewhat anxious.  No real dyspnea.  TR band almost removed.  Aggrastat infusion complete.  Troponin with modest elevation ~11,000.  Echocardiogram being done.  We will order every 8 hour Valium for anxiety and sleep.  Plan is to monitor in the CCU today.  Potentially ambulate in the afternoon.  If absolutely necessary, could possibly transfer out of ICU by this evening.   Glenetta Hew, MD

## 2020-05-07 NOTE — ED Triage Notes (Signed)
Patient states chest pain that started less than 1 hour ago. Patient states pain woke him up from his sleep. Patient took 2 aspirin at home.

## 2020-05-07 NOTE — Progress Notes (Signed)
  Echocardiogram 2D Echocardiogram has been performed.  Gordon Richards 05/07/2020, 11:49 AM

## 2020-05-07 NOTE — H&P (Signed)
Cardiology History & Physical    Patient ID: Gordon Richards MRN: [id number], DOB/AGE: 1961/07/20   Admit date: 05/07/2020  Primary Physician: [name], MD Primary Cardiologist: No primary care provider on file.  Patient Profile    59 year old male with prior history of diverticulitis who presented to ED with complaint of typical angina for proceeding week that hs been worsening acutely over last 12 hours.  History of Present Illness    Mr. Gordon Richards is a 59 year old caucasian male without significant past medical history aside from an isolated episode of diverticulitis who presented with chest pain.  He has been having exertional chest pain and dyspnea for the last week that was worsened this evening and awoke him from sleep.  He presented to the ED and took two aspirin at home.  EKG on presentation to [facility] showed inferior ST elevation with reciprocal changes consistent with STEMI and he was taken straight to catheterization lab on arrival.  Found to have acute occlusion of RCA with obstructive disease of large bifurcating OM1 and underwent PCI w/ DES to both with good result and resolution of chest pain.  Past Medical History   No past medical history on file.  Past Surgical History:  Procedure Laterality Date  . COLONOSCOPY WITH PROPOFOL N/A 10/26/2015   Procedure: COLONOSCOPY WITH PROPOFOL;  Surgeon: [name], MD;  Location: [facility];  Service: Endoscopy;  Laterality: N/A;  Cecum time in  0748  time out  0758  total time 10 minutes  . ESOPHAGOGASTRODUODENOSCOPY N/A 10/09/2015   Procedure: ESOPHAGOGASTRODUODENOSCOPY (EGD);  Surgeon: [name], MD;  Location: [facility];  Service: Endoscopy;  Laterality: N/A;  . FOOT SURGERY Right    bone cutting  . HEMORROIDECTOMY  2003  . LIPOMA EXCISION Right 11/08/2013   Procedure: EXCISION NEOPLASM RIGHT ARM;  Surgeon: [name], MD;  Location: [facility];  Service: General;  Laterality: Right;  . MASS EXCISION Right  11/08/2013   Procedure: EXCISION NEOPLASM SCROTUM;  Surgeon: [name], MD;  Location: [facility];  Service: General;  Laterality: Right;     Allergies No Known Allergies  Home Medications    Prior to Admission medications   Medication Sig Start Date End Date Taking? Authorizing Provider  aspirin EC 81 MG tablet Take 1 tablet (81 mg total) by mouth daily. 10/09/15  Yes Rehman, Mechele Dawley, MD  vitamin C (ASCORBIC ACID) 500 MG tablet Take 2,000 mg by mouth daily.     [provider]    Family History   History reviewed. No pertinent family history.   Social History    Social History   Socioeconomic History  . Marital status: Married    Spouse name: Not on file  . Number of children: Not on file  . Years of education: Not on file  . Highest education level: Not on file  Occupational History  . Not on file  Tobacco Use  . Smoking status: Current Every Day Smoker    Packs/day: 1.00    Years: 5.00    Pack years: 5.00    Types: Cigarettes  . Smokeless tobacco: Never Used  Substance and Sexual Activity  . Alcohol use: Yes    Alcohol/week: 0.0 standard drinks    Comment: rarely  . Drug use: Yes    Types: Marijuana    Comment: 10/18/15  . Sexual activity: Yes    Birth control/protection: None  Other Topics Concern  . Not  on file  Social History Narrative  . Not on file   Social Determinants of Health   Financial Resource Strain:   . Difficulty of Paying Living Expenses:   Food Insecurity:   . Worried About Charity fundraiser in the Last Year:   . Arboriculturist in the Last Year:   Transportation Needs:   . Film/video editor (Medical):   Marland Kitchen Lack of Transportation (Non-Medical):   Physical Activity:   . Days of Exercise per Week:   . Minutes of Exercise per Session:   Stress:   . Feeling of Stress :   Social Connections:   . Frequency of Communication with Friends and Family:   . Frequency of Social Gatherings with Friends and Family:   .  Attends Religious Services:   . Active Member of Clubs or Organizations:   . Attends Archivist Meetings:   Marland Kitchen Marital Status:   Intimate Partner Violence:   . Fear of Current or Ex-Partner:   . Emotionally Abused:   Marland Kitchen Physically Abused:   . Sexually Abused:      Review of Systems    General:  No chills, fever, night sweats or weight changes.  Cardiovascular:  chest pain, dyspnea on exertion, edema, orthopnea, palpitations, paroxysmal nocturnal dyspnea. Dermatological: No rash, lesions/masses Respiratory: No cough, dyspnea Urologic: No hematuria, dysuria Abdominal:   No nausea, vomiting, diarrhea, bright red blood per rectum, melena, or hematemesis Neurologic:  No visual changes, wkns, changes in mental status. All other systems reviewed and are otherwise negative except as noted above.  Physical Exam    BP (!) 190/103 (BP Location: Left Arm)   Pulse 81   Temp 98.7 F (37.1 C) (Oral)   Resp (!) 22   Ht 5\' 10"  (1.778 m)   Wt (!) 145.2 kg   SpO2 95%   BMI 45.92 kg/m  General: Alert, NAD HEENT: Normal  Neck: No bruits or JVD. Lungs:  Resp regular and unlabored, CTA bilaterally. Heart: Regular rhythm, no s3, s4, or murmurs. Abdomen: Soft, non-tender, non-distended, BS +.  Extremities: Warm. No clubbing, cyanosis or edema. DP/PT/Radials 2+ and equal bilaterally. Psych: Normal affect. Neuro: Alert and oriented. No gross focal deficits. No abnormal movements.  Labs    Reviewed and significant for Potassium 3.2 TropI 1150 LDL50, HDL 32 WBC 12.2 A1C pending Sars-COV2 pending  Radiology Studies    DG Chest Port 1 View  Result Date: 05/07/2020 CLINICAL DATA:  Myocardial infarction, tobacco abuse EXAM: PORTABLE CHEST 1 VIEW COMPARISON:  02/08/2007 FINDINGS: The heart size and mediastinal contours are within normal limits. Both lungs are clear. The visualized skeletal structures are unremarkable. IMPRESSION: No active disease. Electronically Signed   By: Randa Ngo M.D.   On: 05/07/2020 03:22    ECG & Cardiac Imaging   EKG personally reviewed and as follows. Normal sinus rhythm with inferior ST elevations, isolated Q in III, reciprocal anterior depressions consistent with STEMI.  Significant changes from prior in 2016.  Assessment & Plan    59 year old male with history of diverticulitis and morbid obesity who presented with crescendo typical angina found to have ST-elevation myocardial infarction.  Invasive angiography demonstrated acute thrombotic occlusion of the prox/mid RCA with obstructive disease of OM1,  Underwent successful revascularization of both after loading with ticagrelor.  Problem list ST-elevation myocardial infarction Hyperlipidemia Moderate hypokalemia Morbid obesity Cigarette use  Plan ST-elevation myocardial infarction - ASA 81 mg daily indefinitely - Ticagrelor 90 mg  BID at least 6 months and preferably one year - Atorvastatin 80 mg now and indefinitely; goal LDL <70 at follow-up. - Tirofiban infusion x12 hours and run out - Risk stratification with lipids, A1C, TSH, echocardiogram - Metoprolol 12.5 mg tartrate; titrate as tolerate and consolidate to succinate prior to discharge. - Cardiac rehabilitation referral  Hyperlipidemia - Atorvastatin 80 mg daily  Moderate hypokalemia - Potassium repletion  Morbid obesity - Nutrition consult; cardiac rehab  Cigarette use - Smoking cessation advised  Nutrition: Heat healthy DVT ppx: Lovenox GI ppx: Start pantoprazole 40 mg daily w/ DAPT Advanced Care Planning: Full code  Signed, Delight Hoh, MD 05/07/2020, 4:22 AM

## 2020-05-07 NOTE — ED Provider Notes (Signed)
Emergency Department Provider Note  I have reviewed the triage vital signs and the nursing notes.  HISTORY  Chief Complaint Chest Pain   HPI Gordon Richards is a 59 y.o. male who presents to the emergency department via POV with his wife secondary to chest pain.  Patient states he has had a week's worth of exertional chest pain and dyspnea better with rest.  Tonight he was woken from sleep with pressure across his whole chest associated with some diaphoresis, dyspnea.  No vomiting or lightheadedness.  No syncope.  Took 2 aspirin at home and presented here for further evaluation.   History of smoking, hypertension, hyperlipidemia and obesity.  No other associated or modifying symptoms.   Level 5 caveat secondary to acuity of condition and need for emergent transfer  No past medical history on file.  There are no problems to display for this patient.   Past Surgical History:  Procedure Laterality Date  . COLONOSCOPY WITH PROPOFOL N/A 10/26/2015   Procedure: COLONOSCOPY WITH PROPOFOL;  Surgeon: [name], MD;  Location: [facility];  Service: Endoscopy;  Laterality: N/A;  [place] time in  0748  time out  0758  total time 10 minutes  . ESOPHAGOGASTRODUODENOSCOPY N/A 10/09/2015   Procedure: ESOPHAGOGASTRODUODENOSCOPY (EGD);  Surgeon: [name], MD;  Location: [facility];  Service: Endoscopy;  Laterality: N/A;  . FOOT SURGERY Right    bone cutting  . HEMORROIDECTOMY  2003  . LIPOMA EXCISION Right 11/08/2013   Procedure: EXCISION NEOPLASM RIGHT ARM;  Surgeon: [name], MD;  Location: [facility];  Service: General;  Laterality: Right;  . MASS EXCISION Right 11/08/2013   Procedure: EXCISION NEOPLASM SCROTUM;  Surgeon: [name], MD;  Location: [facility];  Service: General;  Laterality: Right;    Current Outpatient Rx  . Order #: [id number] Class: OTC  . Order #: [id number] Class: Historical Med    Allergies Patient has no known allergies.  History reviewed. No pertinent  family history.  Social History Social History   Tobacco Use  . Smoking status: Current Every Day Smoker    Packs/day: 1.00    Years: 5.00    Pack years: 5.00    Types: Cigarettes  . Smokeless tobacco: Never Used  Substance Use Topics  . Alcohol use: Yes    Alcohol/week: 0.0 standard drinks    Comment: rarely  . Drug use: Yes    Types: Marijuana    Comment: 10/18/15    Review of Systems  All other systems negative except as documented in the HPI. All pertinent positives and negatives as reviewed in the HPI. ____________________________________________  PHYSICAL EXAM:  VITAL SIGNS: ED Triage Vitals  Enc Vitals Group     BP 05/07/20 0258 (!) 190/103     Pulse Rate 05/07/20 0258 81     Resp 05/07/20 0258 (!) 22     Temp 05/07/20 0258 98.7 F (37.1 C)     Temp Source 05/07/20 0258 Oral     SpO2 05/07/20 0258 95 %     Weight 05/07/20 0259 (!) 320 lb (145.2 kg)     Height 05/07/20 0259 5\' 10"  (1.778 m)    Constitutional: Morbidly obese.  Alert and oriented. Well appearing and in no acute distress. Eyes: Conjunctivae are normal. PERRL. EOMI. Head: Atraumatic. Nose: No congestion/rhinnorhea. Mouth/Throat: Mucous membranes are moist.  Oropharynx non-erythematous. Neck: No stridor.  No meningeal signs.   Cardiovascular: Normal rate, regular rhythm. Good peripheral circulation. Grossly normal heart sounds.  Respiratory: Tachypneic respiratory effort.  No retractions. Lungs CTAB. Gastrointestinal: Soft and nontender. No distention.  Musculoskeletal: No lower extremity tenderness nor edema. No gross deformities of extremities. Neurologic:  Normal speech and language. No gross focal neurologic deficits are appreciated.  Skin:  Skin is warm, diaphoretic and intact. No rash noted.  ____________________________________________   LABS (all labs ordered are listed, but only abnormal results are displayed)  Labs Reviewed  CBC WITH DIFFERENTIAL/PLATELET - Abnormal; Notable  for the following components:      Result Value   WBC 12.2 (*)    Monocytes Absolute 1.3 (*)    All other components within normal limits  SARS CORONAVIRUS 2 BY RT PCR (HOSPITAL ORDER, [facility])  HEMOGLOBIN A1C  PROTIME-INR  APTT  COMPREHENSIVE METABOLIC PANEL  LIPID PANEL  TROPONIN I (HIGH SENSITIVITY)   ____________________________________________  EKG   EKG Interpretation  Date/Time:  Thursday May 07 2020 03:00:05 EDT Ventricular Rate:  76 PR Interval:  180 QRS Duration: 108 QT Interval:  404 QTC Calculation: [id number] R Axis:   14 Text Interpretation:  Critical Test Result: STEMI Normal sinus rhythm Incomplete right bundle branch block Inferior infarct , possibly acute ** ** ACUTE MI / STEMI ** ** Consider right ventricular involvement in acute inferior infarct Abnormal ECG Confirmed by Merrily Pew [phone]) on 05/07/2020 3:04:20 AM      ____________________________________________  RADIOLOGY  DG Chest Port 1 View  Result Date: 05/07/2020 CLINICAL DATA:  Myocardial infarction, tobacco abuse EXAM: PORTABLE CHEST 1 VIEW COMPARISON:  02/08/2007 FINDINGS: The heart size and mediastinal contours are within normal limits. Both lungs are clear. The visualized skeletal structures are unremarkable. IMPRESSION: No active disease. Electronically Signed   By: Randa Ngo M.D.   On: 05/07/2020 03:22   ____________________________________________  PROCEDURES  Procedure(s) performed:   .Critical Care Performed by: Merrily Pew, MD Authorized by: Merrily Pew, MD   Critical care provider statement:    Critical care time (minutes):  45   Critical care was necessary to treat or prevent imminent or life-threatening deterioration of the following conditions:  Cardiac failure   Critical care was time spent personally by me on the following activities:  Discussions with consultants, evaluation of patient's response to treatment, examination of patient,  ordering and performing treatments and interventions, ordering and review of laboratory studies, ordering and review of radiographic studies, pulse oximetry, re-evaluation of patient's condition, obtaining history from patient or surrogate and review of old charts   ____________________________________________  INITIAL IMPRESSION / [place] / ED COURSE   This patient presents to the ED for concern of chest pain, this involves an extensive number of treatment options, and is a complaint that carries with it a high risk of complications and morbidity.  The differential diagnosis includes aortic dissection, myocardial infarction, pulmonary embolus, musculoskeletal.  His EKG was consistent with inferior STEMI with lateral reciprocal changes.  Code STEMI was activated, heparin given, aspirin given.  1 dose of nitroglycerin was given however held the rest with concern of right ventricular involvement wanted to ensure that it did not cause right ventricular collapse secondary to the vasodilation.  Discussed with Dr. Ellyn Hack with cardiology who accepts to the [facility] Lab at [facility] for emergent PCI.  Chest x-ray viewed by myself prior to leaving without any evidence of widened mediastinum or other concern for aortic dissection (especially given his high blood pressure).  Critical Interventions: Code STEMI activated, heparin given, aspirin given, cardiology consultation  obtained, immediate transfer to Cath Lab ____________________________________________  FINAL CLINICAL IMPRESSION(S) / ED DIAGNOSES  Final diagnoses:  ST elevation myocardial infarction involving right coronary artery [facility])    MEDICATIONS GIVEN DURING THIS VISIT:  Medications  0.9 %  sodium chloride infusion ( Intravenous New Bag/Given 05/07/20 0318)  nitroGLYCERIN (NITROSTAT) SL tablet 0.4 mg (0.4 mg Sublingual Given 05/07/20 0316)  aspirin chewable tablet 324 mg (324 mg Oral Given 05/07/20 0316)  heparin injection  4,000 Units (4,000 Units Intravenous Given 05/07/20 0316)    NEW OUTPATIENT MEDICATIONS STARTED DURING THIS VISIT:  New Prescriptions   No medications on file    Note:  This note was prepared with assistance of Dragon voice recognition software. Occasional wrong-word or sound-a-like substitutions may have occurred due to the inherent limitations of voice recognition software.   [place] Bergin, Corene Cornea, MD 05/07/20 0330

## 2020-05-08 ENCOUNTER — Encounter (HOSPITAL_COMMUNITY): Payer: Self-pay | Admitting: Cardiology

## 2020-05-08 DIAGNOSIS — I2510 Atherosclerotic heart disease of native coronary artery without angina pectoris: Secondary | ICD-10-CM

## 2020-05-08 DIAGNOSIS — E7849 Other hyperlipidemia: Secondary | ICD-10-CM | POA: Diagnosis present

## 2020-05-08 DIAGNOSIS — I2119 ST elevation (STEMI) myocardial infarction involving other coronary artery of inferior wall: Principal | ICD-10-CM

## 2020-05-08 DIAGNOSIS — Z9861 Coronary angioplasty status: Secondary | ICD-10-CM

## 2020-05-08 HISTORY — DX: Atherosclerotic heart disease of native coronary artery without angina pectoris: I25.10

## 2020-05-08 HISTORY — DX: Morbid (severe) obesity due to excess calories: E66.01

## 2020-05-08 HISTORY — DX: Other hyperlipidemia: E78.49

## 2020-05-08 HISTORY — DX: Atherosclerotic heart disease of native coronary artery without angina pectoris: Z98.61

## 2020-05-08 LAB — CBC
HCT: 46.7 % (ref 39.0–52.0)
Hemoglobin: 15.2 g/dL (ref 13.0–17.0)
MCH: 29.5 pg (ref 26.0–34.0)
MCHC: 32.5 g/dL (ref 30.0–36.0)
MCV: 90.7 fL (ref 80.0–100.0)
Platelets: 223 10*3/uL (ref 150–400)
RBC: 5.15 MIL/uL (ref 4.22–5.81)
RDW: 13.1 % (ref 11.5–15.5)
WBC: 12 10*3/uL — ABNORMAL HIGH (ref 4.0–10.5)
nRBC: 0 % (ref 0.0–0.2)

## 2020-05-08 LAB — BASIC METABOLIC PANEL
Anion gap: 8 (ref 5–15)
BUN: 12 mg/dL (ref 6–20)
CO2: 28 mmol/L (ref 22–32)
Calcium: 8.9 mg/dL (ref 8.9–10.3)
Chloride: 103 mmol/L (ref 98–111)
Creatinine, Ser: 0.94 mg/dL (ref 0.61–1.24)
GFR calc Af Amer: >60 mL/min (ref >60)
GFR calc non Af Amer: >60 mL/min (ref >60)
Glucose, Bld: 151 mg/dL — ABNORMAL HIGH (ref 70–99)
Potassium: 3.7 mmol/L (ref 3.5–5.1)
Sodium: 139 mmol/L (ref 135–145)

## 2020-05-08 LAB — GLUCOSE, CAPILLARY: Glucose-Capillary: 78 mg/dL (ref 70–99)

## 2020-05-08 NOTE — Progress Notes (Signed)
CARDIAC REHAB PHASE I   PRE:  Rate/Rhythm: 82 SR  BP:  Supine:   Sitting: 135/94  Standing:    SaO2: 95%RA  MODE:  Ambulation: 600 ft   POST:  Rate/Rhythm: 85 SR  BP:  Supine:   Sitting: 139/81  Standing:    SaO2: 94%RA 1255-1410 Pt walked 600 ft on RA with steady gait and no CP. Tolerated well. MI education completed with pt and wifewho voiced understanding. Stressed importance of brilinta with stent. Reviewed NTG use,MI restrictions, heart healthy food choices and need to watch carbs, walking for ex. , smoking cessation, and CRP 2. Referred to [facility] smoking cessation handout. Pt quit once cold [place] for about 11 years. Stated he is going to quit now. Highly recommended CRP 2 to help with weight loss and risk factor modification.   Graylon Good, RN BSN  05/08/2020 2:06 PM  82

## 2020-05-08 NOTE — Progress Notes (Addendum)
Progress Note  Patient Name: Gordon Richards Date of Encounter: 05/08/2020  Primary Cardiologist: Glenetta Hew, MD   Patient Profile     Gordon Richards is a 59 year old caucasian male without significant past medical history aside from an isolated episode of diverticulitis who presented with chest pain.  He has been having exertional chest pain and dyspnea for the last week that was worsened this evening and awoke him from sleep.  He presented to the ED and took two aspirin at home.  EKG on presentation to [facility] showed inferior ST elevation with reciprocal changes consistent with STEMI and he was taken straight to catheterization lab on arrival.  Found to have acute occlusion of RCA with obstructive disease of large bifurcating OM1 and underwent PCI w/ DES to both with good result and resolution of chest pain.  Principal Problem:   Acute ST elevation myocardial infarction (STEMI) of inferior wall (HCC) Active Problems:   Coronary artery disease involving native coronary artery of native heart with unstable angina pectoris ([place])   CAD S/P DES PCI - pRCA, ostOM1. CTO of AVG Cx   Morbid obesity ([place])   Hyperlipidemia due to dietary fat intake   Subjective   No pain, no issues with dyspnea. Walking in hallway.  Still thinks this is a joke.  Inpatient Medications    Scheduled Meds: . aspirin EC  81 mg Oral Daily  . atorvastatin  80 mg Oral Daily  . carvedilol  6.25 mg Oral BID WC  . Chlorhexidine Gluconate Cloth  6 each Topical Daily  . enoxaparin (LOVENOX) injection  40 mg Subcutaneous Q24H  . sodium chloride flush  3 mL Intravenous Q12H  . ticagrelor  90 mg Oral BID   Continuous Infusions: . sodium chloride 20 mL/hr at 05/07/20 0318  . sodium chloride     PRN Meds: sodium chloride, acetaminophen, diazepam, nitroGLYCERIN, ondansetron (ZOFRAN) IV, sodium chloride flush   Vital Signs    Vitals:   05/08/20 0400 05/08/20 0500 05/08/20 0600 05/08/20 0815  BP: 136/90 129/85 (!)  133/112 133/81  Pulse: 67 70 88 86  Resp: 17 16 17  (!) 23  Temp: 98.6 F (37 C)     TempSrc: Oral     SpO2: 92% 92% 96% 94%  Weight:   134.1 kg   Height:        Intake/Output Summary (Last 24 hours) at 05/08/2020 1110 Last data filed at 05/08/2020 0815 Gross per 24 hour  Intake 1553.98 ml  Output 1150 ml  Net 403.98 ml   Last 3 Weights 05/08/2020 05/07/2020 05/07/2020  Weight (lbs) 295 lb 10.2 oz 294 lb 8.6 oz 320 lb  Weight (kg) 134.1 kg 133.6 kg 145.151 kg      Telemetry    Sinus rhythm- Personally Reviewed  ECG    NSR- 77 bpm; LAD, iRBBB, ,Inf MI - recent - Personally Reviewed  Physical Exam   GEN:  Morbidly obese no acute distress. Walking around the nursing station without any difficulty Neck: No JVD or bruit Cardiac: RRR, no murmurs, rubs, or gallops. Normal S1-S2 Respiratory: Clear to auscultation bilaterally. Nonlabored GI: Soft, nontender, non-distended ; significant truncal obesity. MS: No edema; No deformity. Neuro:  Nonfocal; A&O X3 Psych: Normal mood and affect   Labs    High Sensitivity Troponin:   Recent Labs  Lab 05/07/20 0305 05/07/20 0821 05/07/20 1042 05/07/20 1256  TROPONINIHS 1,150* 11,304* 16,736* 18,662*      Chemistry Recent Labs  Lab 05/07/20 0305  05/07/20 0821 05/08/20 0234  NA 138 139 139  K 3.2* 3.4* 3.7  CL 101 101 103  CO2 26 24 28   GLUCOSE 142* 137* 151*  BUN 20 15 12   CREATININE 0.85 0.78 0.94  CALCIUM 9.0 9.0 8.9  PROT 7.5 7.0  --   ALBUMIN 4.3 4.0  --   AST 37 68*  --   ALT 47* 49*  --   ALKPHOS 62 56  --   BILITOT 0.6 0.6  --   GFRNONAA >60 >60 >60  GFRAA >60 >60 >60  ANIONGAP 11 14 8      Hematology Recent Labs  Lab 05/07/20 0305 05/07/20 0821 05/08/20 0234  WBC 12.2* 11.9* 12.0*  RBC 5.47 5.47 5.15  HGB 16.5 16.2 15.2  HCT 50.0 48.4 46.7  MCV 91.4 88.5 90.7  MCH 30.2 29.6 29.5  MCHC 33.0 33.5 32.5  RDW 12.7 12.7 13.1  PLT 221 237 223    BNPNo results for input(s): BNP, PROBNP in the last 168  hours.   DDimer No results for input(s): DDIMER in the last 168 hours.   Radiology    CARDIAC CATHETERIZATION  Result Date: 05/07/2020  There is no aortic valve stenosis.  CULPRIT LESION #1 prox RCA-1 lesion is 60% stenosed. Prox RCA-2 lesion is 95% stenosed.  A drug-eluting stent was successfully placed covering both lesions using a STENT RESOLUTE ONYX 4.0X34. Postdilated and taper fashion from 5.2-4.6 mm  Post intervention, there is a 0% residual stenosis.  RPAV lesion is 40% stenosed with 40% stenosed side branch in 2nd RPL.  ---  CULPRIT LESION #2 1st Mrg lesion is 95% stenosed.  A drug-eluting stent was successfully placed using a STENT RESOLUTE ONYX 2.75 X 15 -> postdilated to 2.95 mm  Post intervention, there is a 0% residual stenosis.  --  Lat 1st Mrg lesion is 60% stenosed.  LPAV-1 lesion is 65% stenosed.  LPAV-2 lesion is 99% stenosed. -> Unable to cross with wire, likely currently occluded  ---  Prox LAD to Mid LAD lesion is 40% stenosed.  Mid LAD lesion is 40% stenosed.  ---  There is mild left ventricular systolic dysfunction. The left ventricular ejection fraction is 45-50% by visual estimate. LV end diastolic pressure is moderately elevated.  Prox RCA-2 lesion is 95% stenosed.  SUMMARY  SEVERE THREE-VESSEL DISEASE with proximal RCA 60% followed by mid RCA 95% thrombotic subtotal occlusion (culprit lesion), 99% thrombotic stenosis of large bifurcating OM1 and 70 to 100% likely CTO occlusion of AV groove circumflex (unable to cross).  Successful DES PCI of proximal to mid RCA using Resolute Onyx 4.0 mm x 34 mm postdilated to  5.2 mm proximal and 4.6 mm distal  Successful DES PCI of ostial OM1 with Resolute Onyx DES 2.75 mm x15 mm postdilated to 2.95 mm  Poor visualization on left ventriculogram.  Would recommend echocardiogram.  Could not evaluate wall motion, however EF did not appear to be decreased.  Moderately elevated LVEDP.  Severe systemic hypertension.  RECOMMENDATIONS  Admit to CCU for ongoing care, or on Aggrastat until current bottle complete.  Aggressive risk factor modification-we will check lipid panel start atorvastatin 80 mg daily  Start carvedilol titrate accordingly blood pressure..  Medications ordered.  Smoking cessation counseling  2D echo ordered  With two-vessel PCI large territory involved, likely not a candidate for fast-track discharge. Glenetta Hew, MD  DG Chest Port 1 View  Result Date: 05/07/2020 CLINICAL DATA:  Myocardial infarction, tobacco abuse EXAM: PORTABLE CHEST 1  VIEW COMPARISON:  02/08/2007 FINDINGS: The heart size and mediastinal contours are within normal limits. Both lungs are clear. The visualized skeletal structures are unremarkable. IMPRESSION: No active disease. Electronically Signed   By: Randa Ngo M.D.   On: 05/07/2020 03:22   ECHOCARDIOGRAM COMPLETE  Result Date: 05/07/2020    ECHOCARDIOGRAM REPORT   Patient Name:   TULLY SAKAMOTO Date of Exam: 05/07/2020 Medical Rec #:  [id number]     Height:       70.0 in Accession #:    [id number]    Weight:       294.5 lb Date of Birth:  1961/09/20    BSA:          2.461 m Patient Age:    62 years      BP:           147/77 mmHg Patient Gender: M             HR:           78 bpm. Exam Location:  Inpatient Procedure: 2D Echo Indications:    acute myocardial infarction 410  History:        Patient has no prior history of Echocardiogram examinations.  Sonographer:    Johny Chess Referring Phys: [id number] CARRIEL T NIPP IMPRESSIONS  1. Left ventricular ejection fraction, by estimation, is 45 to 50%. The left ventricle has mildly decreased function. The left ventricle demonstrates regional wall motion abnormalities (see scoring diagram/findings for description). Left ventricular diastolic parameters are indeterminate. There is moderate hypokinesis of the left ventricular, mid-apical inferior wall and lateral wall.  2. Right ventricular systolic function is normal. The  right ventricular size is normal.  3. The mitral valve is normal in structure. No evidence of mitral valve regurgitation. No evidence of mitral stenosis.  4. The aortic valve is tricuspid. Aortic valve regurgitation is not visualized. No aortic stenosis is present.  5. The inferior vena cava is normal in size with <50% respiratory variability, suggesting right atrial pressure of 8 mmHg. Comparison(s): No prior Echocardiogram. FINDINGS  Left Ventricle: Left ventricular ejection fraction, by estimation, is 45 to 50%. The left ventricle has mildly decreased function. The left ventricle demonstrates regional wall motion abnormalities. Moderate hypokinesis of the left ventricular, mid-apical inferior wall and lateral wall. The left ventricular internal cavity size was normal in size. There is borderline left ventricular hypertrophy. Left ventricular diastolic parameters are indeterminate. Right Ventricle: The right ventricular size is normal. No increase in right ventricular wall thickness. Right ventricular systolic function is normal. Left Atrium: Left atrial size was normal in size. Right Atrium: Right atrial size was normal in size. Pericardium: Trivial pericardial effusion is present. Presence of pericardial fat pad. Mitral Valve: The mitral valve is normal in structure. No evidence of mitral valve regurgitation. No evidence of mitral valve stenosis. Tricuspid Valve: The tricuspid valve is normal in structure. Tricuspid valve regurgitation is trivial. No evidence of tricuspid stenosis. Aortic Valve: The aortic valve is tricuspid. . There is mild thickening and mild calcification of the aortic valve. Aortic valve regurgitation is not visualized. No aortic stenosis is present. There is mild thickening of the aortic valve. There is mild calcification of the aortic valve. Pulmonic Valve: The pulmonic valve was grossly normal. Pulmonic valve regurgitation is not visualized. No evidence of pulmonic stenosis. Aorta: The  aortic root and ascending aorta are structurally normal, with no evidence of dilitation. Venous: The inferior vena cava is normal in size with less than  50% respiratory variability, suggesting right atrial pressure of 8 mmHg. IAS/Shunts: No atrial level shunt detected by color flow Doppler.  LEFT VENTRICLE PLAX 2D LVIDd:         5.60 cm  Diastology LVIDs:         3.70 cm  LV e' lateral:   9.14 cm/s LV PW:         1.20 cm  LV E/e' lateral: 9.8 LV IVS:        1.20 cm  LV e' medial:    5.66 cm/s LVOT diam:     2.10 cm  LV E/e' medial:  15.8 LV SV:         112 LV SV Index:   46 LVOT Area:     3.46 cm  RIGHT VENTRICLE RV S prime:     17.20 cm/s TAPSE (M-mode): 2.8 cm LEFT ATRIUM             Index       RIGHT ATRIUM           Index LA diam:        3.70 cm 1.50 cm/m  RA Area:     15.20 cm LA Vol (A2C):   55.7 ml 22.64 ml/m RA Volume:   41.30 ml  16.78 ml/m LA Vol (A4C):   34.6 ml 14.06 ml/m LA Biplane Vol: 45.2 ml 18.37 ml/m  AORTIC VALVE LVOT Vmax:   154.00 cm/s LVOT Vmean:  96.300 cm/s LVOT VTI:    0.324 m  AORTA Ao Root diam: 3.50 cm Ao Asc diam:  3.50 cm MITRAL VALVE MV Area (PHT): 2.76 cm    SHUNTS MV Decel Time: 275 msec    Systemic VTI:  0.32 m MV E velocity: 89.40 cm/s  Systemic Diam: 2.10 cm MV A velocity: 99.20 cm/s MV E/A ratio:  0.90 Buford Dresser MD Electronically signed by Buford Dresser MD Signature Date/Time: 05/07/2020/5:46:49 PM    Final     Cardiac Studies    05/07/2020 Cath-PCI:  SEVERE THREE-VESSEL DISEASE with proximal RCA 60% followed by mid RCA 95% thrombotic subtotal occlusion (culprit lesion), 99% thrombotic stenosis of large bifurcating OM1 and 70 to 100% likely CTO occlusion of AV groove circumflex (unable to cross). ? Successful DES PCI of proximal to mid RCA using Resolute Onyx 4.0 mm x 34 mm postdilated to  5.2 mm proximal and 4.6 mm distal ? Successful DES PCI of ostial OM1 with Resolute Onyx DES 2.75 mm x15 mm postdilated to 2.95 mm  Poor visualization on  left ventriculogram.  Would recommend echocardiogram.  Could not evaluate wall motion, however EF did not appear to be decreased.  Moderately elevated LVEDP.  Severe systemic hypertension.  05/07/2020 Echo: EF 45-50%, moderate HK of mid apical inferior and lateral wall. Normal valves. Normal atrial pressures.  Assessment & Plan    Principal Problem:   Acute ST elevation myocardial infarction (STEMI) of inferior wall (HCC) Active Problems:   Coronary artery disease involving native coronary artery of native heart with unstable angina pectoris ([place])   CAD S/P DES PCI - pRCA, ostOM1. CTO of AVG Cx   Morbid obesity ([place])   Hyperlipidemia due to dietary fat intake  No further chest pain after two-vessel PCI. Echocardiogram reveals inferior lateral hypokinesis not inconsistent with his CAD. I suspect that some of this is going to recover post PCI as his vessel right total occluded. No heart failure symptoms.  On aspirin and Brilinta. Started high-dose high intensity statin along with  carvedilol 6.25 mg twice daily. -> Reevaluate blood pressure in the morning, consider adding ARB.  We will check hemoglobin A1c prior to discharge, suspect that there may be underlying diabetes.  Smoking cessation counseling provided. -Less than 30 minutes. Patient understands. Discussed importance of exercise with morbid obesity. He used to weigh 120 pounds less just a year ago. Talked by the importance of getting back into this exercise regimen and smoking cessation.    Doing relatively well day 1 post PCI. Will transfer out of ICU today anticipate potential discharge tomorrow if stable.    For questions or updates, please contact [facility] Please consult [email or web address] for contact info under        Signed, Glenetta Hew, MD  05/08/2020, 11:10 AM   /

## 2020-05-09 ENCOUNTER — Other Ambulatory Visit (HOSPITAL_COMMUNITY): Payer: Self-pay | Admitting: Cardiology

## 2020-05-09 DIAGNOSIS — Z720 Tobacco use: Secondary | ICD-10-CM

## 2020-05-09 MED ORDER — NITROGLYCERIN 0.4 MG SL SUBL: 0.4000 mg | SUBLINGUAL_TABLET | SUBLINGUAL | 0 refills | Status: DC | PRN

## 2020-05-09 MED ORDER — CARVEDILOL 12.5 MG PO TABS: 12.5000 mg | ORAL_TABLET | Freq: Two times a day (BID) | ORAL | 2 refills | Status: DC

## 2020-05-09 MED ORDER — TICAGRELOR 90 MG PO TABS: 90.0000 mg | ORAL_TABLET | Freq: Two times a day (BID) | ORAL | 2 refills | Status: DC

## 2020-05-09 MED ORDER — CARVEDILOL 12.5 MG PO TABS: 12.5000 mg | ORAL_TABLET | Freq: Two times a day (BID) | ORAL | Status: DC

## 2020-05-09 MED ORDER — LOSARTAN POTASSIUM 25 MG PO TABS: 12.5000 mg | ORAL_TABLET | Freq: Every day | ORAL | 2 refills | Status: DC

## 2020-05-09 MED ORDER — ATORVASTATIN CALCIUM 80 MG PO TABS: 80.0000 mg | ORAL_TABLET | Freq: Every day | ORAL | 2 refills | Status: DC

## 2020-05-09 MED ORDER — LOSARTAN POTASSIUM 25 MG PO TABS
12.5000 mg | ORAL_TABLET | Freq: Every day | ORAL | Status: DC
  Administered 2020-05-09: 12.5 mg via ORAL
  Filled 2020-05-09: qty 1

## 2020-05-09 NOTE — Care Management (Signed)
Patient and wife provided with 30 day and copay reduction cards for Brilinta.

## 2020-05-09 NOTE — Discharge Summary (Signed)
Discharge Summary    Patient ID: Gordon Richards MRN: [id number]; DOB: [birth year]  Admit date: 05/07/2020 Discharge date: 05/09/2020  Primary Care Provider: [name], MD  Primary Cardiologist: Glenetta Hew, MD   Discharge Diagnoses    Principal Problem:   Acute ST elevation myocardial infarction (STEMI) of inferior wall [facility]) Active Problems:   Coronary artery disease involving native coronary artery of native heart with unstable angina pectoris ([place])   Morbid obesity ([place])   Hyperlipidemia due to dietary fat intake   CAD S/P DES PCI - pRCA, ostOM1. CTO of AVG Cx  Diagnostic Studies/Procedures   LHC 05/07/20:   There is no aortic valve stenosis.  CULPRIT LESION #1 prox RCA-1 lesion is 60% stenosed. Prox RCA-2 lesion is 95% stenosed.  A drug-eluting stent was successfully placed covering both lesions using a STENT RESOLUTE ONYX 4.0X34. Postdilated and taper fashion from 5.2-4.6 mm  Post intervention, there is a 0% residual stenosis.  RPAV lesion is 40% stenosed with 40% stenosed side branch in 2nd RPL.  ---  CULPRIT LESION #2 1st Mrg lesion is 95% stenosed.  A drug-eluting stent was successfully placed using a STENT RESOLUTE ONYX 2.75 X 15 -> postdilated to 2.95 mm  Post intervention, there is a 0% residual stenosis.  --  Lat 1st Mrg lesion is 60% stenosed.  LPAV-1 lesion is 65% stenosed.  LPAV-2 lesion is 99% stenosed. -> Unable to cross with wire, likely currently occluded  ---  Prox LAD to Mid LAD lesion is 40% stenosed.  Mid LAD lesion is 40% stenosed.  ---  There is mild left ventricular systolic dysfunction. The left ventricular ejection fraction is 45-50% by visual estimate. LV end diastolic pressure is moderately elevated.  Prox RCA-2 lesion is 95% stenosed.   SUMMARY  SEVERE THREE-VESSEL DISEASE with proximal RCA 60% followed by mid RCA 95% thrombotic subtotal occlusion (culprit lesion), 99% thrombotic stenosis of large bifurcating OM1  and 70 to 100% likely CTO occlusion of AV groove circumflex (unable to cross). ? Successful DES PCI of proximal to mid RCA using Resolute Onyx 4.0 mm x 34 mm postdilated to  5.2 mm proximal and 4.6 mm distal ? Successful DES PCI of ostial OM1 with Resolute Onyx DES 2.75 mm x15 mm postdilated to 2.95 mm  Poor visualization on left ventriculogram.  Would recommend echocardiogram.  Could not evaluate wall motion, however EF did not appear to be decreased.  Moderately elevated LVEDP.  Severe systemic hypertension.   RECOMMENDATIONS  Admit to CCU for ongoing care, or on Aggrastat until current bottle complete.  Aggressive risk factor modification-we will check lipid panel start atorvastatin 80 mg daily  Start carvedilol titrate accordingly blood pressure..  Medications ordered.  Smoking cessation counseling  2D echo ordered  With two-vessel PCI large territory involved, likely not a candidate for fast-track discharge.   Echocardiogram 05/07/20:  1. Left ventricular ejection fraction, by estimation, is 45 to 50%. The  left ventricle has mildly decreased function. The left ventricle  demonstrates regional wall motion abnormalities (see scoring  diagram/findings for description). Left ventricular  diastolic parameters are indeterminate. There is moderate hypokinesis of  the left ventricular, mid-apical inferior wall and lateral wall.  2. Right ventricular systolic function is normal. The right ventricular  size is normal.  3. The mitral valve is normal in structure. No evidence of mitral valve  regurgitation. No evidence of mitral stenosis.  4. The aortic valve is tricuspid. Aortic valve regurgitation is not  visualized. No aortic  stenosis is present.  5. The inferior vena cava is normal in size with <50% respiratory  variability, suggesting right atrial pressure of 8 mmHg.  ___________   History of Present Illness     Gordon Richards is a 59 y.o. male with no prior CAD  history with isolated episodes diverticulitis who presented with chest pain 05/07/20.  He reported having exertional chest pain and dyspnea for one week prior to presentation which worsened and awoke him from sleep. He took two ASA tablets at home and called EMS for transport to the ED for further evaluation. EKG showed inferior ST elevation with reciprocal changes consistent with STEMI and he was taken straight to catheterization lab on [facility] arrival.  LHC showed an acute occlusion of RCA with obstructive disease of large bifurcating OM1 and underwent PCI w/ DES to both with good result and resolution of chest pain.  Hospital Course   In the post cath setting, he has done well with no acute issues. He has worked with cardiac rehabilitation without anginal symptoms. Cath site stable without signs of hematoma. Medication plan is for DAPT with ASA and Brilinta 90mg  BID long term given extent of disease with stented segment. Per cath note would do Brilinta uninterrupted 90mg  BID then after a year, may reduce to 60mg  BID and may interrupt if needed. Creatinine stable today at 0.94. Hb stable at 15.2.   LDL was noted to be elevated at 148. He was started on high intensity atorvastatin and will need Lipid panel and LFTS in 6-8 weeks. HbA1c, 6.0. BP has been moderately elevated, may need further titration in the OP setting. Losartan added for greater BP control given mildly reduced EF. Smoking cessation reviewed during hospital course.   Consultants: None    The patient was seen and examined by Dr. Rayann Heman who feels that he is stable and ready for discharge today, 05/09/20.   Did the patient have an acute coronary syndrome (MI, NSTEMI, STEMI, etc) this admission?:  Yes                               AHA/ACC Clinical Performance & Quality Measures: 5. Aspirin prescribed? - Yes 6. ADP Receptor Inhibitor (Plavix/Clopidogrel, Brilinta/Ticagrelor or Effient/Prasugrel) prescribed (includes medically managed  patients)? - Yes 7. Beta Blocker prescribed? - Yes 8. High Intensity Statin (Lipitor 40-80mg  or Crestor 20-40mg ) prescribed? - Yes 9. EF assessed during THIS hospitalization? - Yes 10. For EF <40%, was ACEI/ARB prescribed? - Not Applicable (EF >/= [id number]) 11. For EF <40%, Aldosterone Antagonist (Spironolactone or Eplerenone) prescribed? - Not Applicable (EF >/= [id number]) 12. Cardiac Rehab Phase II ordered (Included Medically managed Patients)? - Yes   _____________  Discharge Vitals Blood pressure (!) 167/97, pulse 75, temperature 98.1 F (36.7 C), temperature source Oral, resp. rate 18, height 5\' 10"  (1.778 m), weight 134.7 kg, SpO2 94 %.  Filed Weights   05/07/20 0645 05/08/20 0600 05/09/20 0349  Weight: 133.6 kg 134.1 kg 134.7 kg    Labs & Radiologic Studies    CBC Recent Labs    05/07/20 0305 05/07/20 0305 05/07/20 0821 05/08/20 0234  WBC 12.2*   < > 11.9* 12.0*  NEUTROABS 7.0  --  8.7*  --   HGB 16.5   < > 16.2 15.2  HCT 50.0   < > 48.4 46.7  MCV 91.4   < > 88.5 90.7  PLT 221   < > 237 223   < > =  values in this interval not displayed.   Basic Metabolic Panel Recent Labs    05/07/20 0821 05/08/20 0234  NA 139 139  K 3.4* 3.7  CL 101 103  CO2 24 28  GLUCOSE 137* 151*  BUN 15 12  CREATININE 0.78 0.94  CALCIUM 9.0 8.9  MG 1.8  --    Liver Function Tests Recent Labs    05/07/20 0305 05/07/20 0821  AST 37 68*  ALT 47* 49*  ALKPHOS 62 56  BILITOT 0.6 0.6  PROT 7.5 7.0  ALBUMIN 4.3 4.0   No results for input(s): LIPASE, AMYLASE in the last 72 hours. High Sensitivity Troponin:   Recent Labs  Lab 05/07/20 0305 05/07/20 0821 05/07/20 1042 05/07/20 1256  TROPONINIHS 1,150* 11,304* 16,736* [id number]*    BNP Invalid input(s): POCBNP D-Dimer No results for input(s): DDIMER in the last 72 hours. Hemoglobin A1C Recent Labs    05/07/20 0821  HGBA1C 6.0*   Fasting Lipid Panel Recent Labs    05/07/20 0821  CHOL 203*  HDL 33*  LDLCALC 148*  TRIG 108    CHOLHDL 6.2   Thyroid Function Tests Recent Labs    05/07/20 0821  TSH 2.520   _____________  CARDIAC CATHETERIZATION  Result Date: 05/07/2020  There is no aortic valve stenosis.  CULPRIT LESION #1 prox RCA-1 lesion is 60% stenosed. Prox RCA-2 lesion is 95% stenosed.  A drug-eluting stent was successfully placed covering both lesions using a STENT RESOLUTE ONYX 4.0X34. Postdilated and taper fashion from 5.2-4.6 mm  Post intervention, there is a 0% residual stenosis.  RPAV lesion is 40% stenosed with 40% stenosed side branch in 2nd RPL.  ---  CULPRIT LESION #2 1st Mrg lesion is 95% stenosed.  A drug-eluting stent was successfully placed using a STENT RESOLUTE ONYX 2.75 X 15 -> postdilated to 2.95 mm  Post intervention, there is a 0% residual stenosis.  --  Lat 1st Mrg lesion is 60% stenosed.  LPAV-1 lesion is 65% stenosed.  LPAV-2 lesion is 99% stenosed. -> Unable to cross with wire, likely currently occluded  ---  Prox LAD to Mid LAD lesion is 40% stenosed.  Mid LAD lesion is 40% stenosed.  ---  There is mild left ventricular systolic dysfunction. The left ventricular ejection fraction is 45-50% by visual estimate. LV end diastolic pressure is moderately elevated.  Prox RCA-2 lesion is 95% stenosed.  SUMMARY  SEVERE THREE-VESSEL DISEASE with proximal RCA 60% followed by mid RCA 95% thrombotic subtotal occlusion (culprit lesion), 99% thrombotic stenosis of large bifurcating OM1 and 70 to 100% likely CTO occlusion of AV groove circumflex (unable to cross).  Successful DES PCI of proximal to mid RCA using Resolute Onyx 4.0 mm x 34 mm postdilated to  5.2 mm proximal and 4.6 mm distal  Successful DES PCI of ostial OM1 with Resolute Onyx DES 2.75 mm x15 mm postdilated to 2.95 mm  Poor visualization on left ventriculogram.  Would recommend echocardiogram.  Could not evaluate wall motion, however EF did not appear to be decreased.  Moderately elevated LVEDP.  Severe systemic  hypertension. RECOMMENDATIONS  Admit to CCU for ongoing care, or on Aggrastat until current bottle complete.  Aggressive risk factor modification-we will check lipid panel start atorvastatin 80 mg daily  Start carvedilol titrate accordingly blood pressure..  Medications ordered.  Smoking cessation counseling  2D echo ordered  With two-vessel PCI large territory involved, likely not a candidate for fast-track discharge. Glenetta Hew, MD  DG Chest [facility]  Result Date: 05/07/2020 CLINICAL DATA:  Myocardial infarction, tobacco abuse EXAM: PORTABLE CHEST 1 VIEW COMPARISON:  02/08/2007 FINDINGS: The heart size and mediastinal contours are within normal limits. Both lungs are clear. The visualized skeletal structures are unremarkable. IMPRESSION: No active disease. Electronically Signed   By: Randa Ngo M.D.   On: 05/07/2020 03:22   ECHOCARDIOGRAM COMPLETE  Result Date: 05/07/2020    ECHOCARDIOGRAM REPORT   Patient Name:   Gordon Richards Date of Exam: 05/07/2020 Medical Rec #:  [id number]     Height:       70.0 in Accession #:    [id number]    Weight:       294.5 lb Date of Birth:  September 05, 1961    BSA:          2.461 m Patient Age:    51 years      BP:           147/77 mmHg Patient Gender: M             HR:           78 bpm. Exam Location:  Inpatient Procedure: 2D Echo Indications:    acute myocardial infarction 410  History:        Patient has no prior history of Echocardiogram examinations.  Sonographer:    Johny Chess Referring Phys: [id number] CARRIEL T NIPP IMPRESSIONS  1. Left ventricular ejection fraction, by estimation, is 45 to 50%. The left ventricle has mildly decreased function. The left ventricle demonstrates regional wall motion abnormalities (see scoring diagram/findings for description). Left ventricular diastolic parameters are indeterminate. There is moderate hypokinesis of the left ventricular, mid-apical inferior wall and lateral wall.  2. Right ventricular systolic function is  normal. The right ventricular size is normal.  3. The mitral valve is normal in structure. No evidence of mitral valve regurgitation. No evidence of mitral stenosis.  4. The aortic valve is tricuspid. Aortic valve regurgitation is not visualized. No aortic stenosis is present.  5. The inferior vena cava is normal in size with <50% respiratory variability, suggesting right atrial pressure of 8 mmHg. Comparison(s): No prior Echocardiogram. FINDINGS  Left Ventricle: Left ventricular ejection fraction, by estimation, is 45 to 50%. The left ventricle has mildly decreased function. The left ventricle demonstrates regional wall motion abnormalities. Moderate hypokinesis of the left ventricular, mid-apical inferior wall and lateral wall. The left ventricular internal cavity size was normal in size. There is borderline left ventricular hypertrophy. Left ventricular diastolic parameters are indeterminate. Right Ventricle: The right ventricular size is normal. No increase in right ventricular wall thickness. Right ventricular systolic function is normal. Left Atrium: Left atrial size was normal in size. Right Atrium: Right atrial size was normal in size. Pericardium: Trivial pericardial effusion is present. Presence of pericardial fat pad. Mitral Valve: The mitral valve is normal in structure. No evidence of mitral valve regurgitation. No evidence of mitral valve stenosis. Tricuspid Valve: The tricuspid valve is normal in structure. Tricuspid valve regurgitation is trivial. No evidence of tricuspid stenosis. Aortic Valve: The aortic valve is tricuspid. . There is mild thickening and mild calcification of the aortic valve. Aortic valve regurgitation is not visualized. No aortic stenosis is present. There is mild thickening of the aortic valve. There is mild calcification of the aortic valve. Pulmonic Valve: The pulmonic valve was grossly normal. Pulmonic valve regurgitation is not visualized. No evidence of pulmonic stenosis.  Aorta: The aortic root and ascending aorta are structurally normal, with no evidence  of dilitation. Venous: The inferior vena cava is normal in size with less than 50% respiratory variability, suggesting right atrial pressure of 8 mmHg. IAS/Shunts: No atrial level shunt detected by color flow Doppler.  LEFT VENTRICLE PLAX 2D LVIDd:         5.60 cm  Diastology LVIDs:         3.70 cm  LV e' lateral:   9.14 cm/s LV PW:         1.20 cm  LV E/e' lateral: 9.8 LV IVS:        1.20 cm  LV e' medial:    5.66 cm/s LVOT diam:     2.10 cm  LV E/e' medial:  15.8 LV SV:         112 LV SV Index:   46 LVOT Area:     3.46 cm  RIGHT VENTRICLE RV S prime:     17.20 cm/s TAPSE (M-mode): 2.8 cm LEFT ATRIUM             Index       RIGHT ATRIUM           Index LA diam:        3.70 cm 1.50 cm/m  RA Area:     15.20 cm LA Vol (A2C):   55.7 ml 22.64 ml/m RA Volume:   41.30 ml  16.78 ml/m LA Vol (A4C):   34.6 ml 14.06 ml/m LA Biplane Vol: 45.2 ml 18.37 ml/m  AORTIC VALVE LVOT Vmax:   154.00 cm/s LVOT Vmean:  96.300 cm/s LVOT VTI:    0.324 m  AORTA Ao Root diam: 3.50 cm Ao Asc diam:  3.50 cm MITRAL VALVE MV Area (PHT): 2.76 cm    SHUNTS MV Decel Time: 275 msec    Systemic VTI:  0.32 m MV E velocity: 89.40 cm/s  Systemic Diam: 2.10 cm MV A velocity: 99.20 cm/s MV E/A ratio:  0.90 Buford Dresser MD Electronically signed by Buford Dresser MD Signature Date/Time: 05/07/2020/5:46:49 PM    Final    Disposition   Pt is being discharged home today in good condition.  Follow-up Plans & Appointments    Follow-up Information    Leonie Man, MD Follow up.   Specialty: Cardiology Why: The office will call with date and time of appointment Contact information: [address] [place] [address] [phone]          Discharge Instructions    Amb Referral to Cardiac Rehabilitation   Complete by: As directed    Diagnosis:  STEMI Coronary Stents     After initial evaluation and assessments  completed: Virtual Based Care may be provided alone or in conjunction with Phase 2 Cardiac Rehab based on patient barriers.: Yes   Call MD for:  difficulty breathing, headache or visual disturbances   Complete by: As directed    Call MD for:  extreme fatigue   Complete by: As directed    Call MD for:  hives   Complete by: As directed    Call MD for:  persistant dizziness or light-headedness   Complete by: As directed    Call MD for:  persistant nausea and vomiting   Complete by: As directed    Call MD for:  redness, tenderness, or signs of infection (pain, swelling, redness, odor or green/yellow discharge around incision site)   Complete by: As directed    Call MD for:  severe uncontrolled pain   Complete by: As directed  Call MD for:  temperature >100.4   Complete by: As directed    Diet - low sodium heart healthy   Complete by: As directed    Discharge instructions   Complete by: As directed    PLEASE DO NOT [place]!!!!! Also keep a log of you blood pressures and bring back to your follow up appt. Please call the office with any questions.   Patients taking blood thinners should generally stay away from medicines like ibuprofen, Advil, Motrin, naproxen, and Aleve due to risk of stomach bleeding. You may take Tylenol as directed or talk to your primary doctor about alternatives.  No driving for 3-5 days. No lifting over 5 lbs for 1 week. No sexual activity for 1 week. Keep procedure site clean & dry. If you notice increased pain, swelling, bleeding or pus, call/return!  You may shower, but no soaking baths/hot tubs/pools for 1 week.   Increase activity slowly   Complete by: As directed       Discharge Medications   Allergies as of 05/09/2020   No Known Allergies     Medication List    TAKE these medications   aspirin EC 81 MG tablet Take 1 tablet (81 mg total) by mouth daily.   atorvastatin 80 MG tablet Commonly known as: LIPITOR Take 1 tablet  (80 mg total) by mouth daily. Start taking on: May 10, 2020   carvedilol 12.5 MG tablet Commonly known as: COREG Take 1 tablet (12.5 mg total) by mouth 2 (two) times daily with a meal.   losartan 25 MG tablet Commonly known as: COZAAR Take 0.5 tablets (12.5 mg total) by mouth daily.   nitroGLYCERIN 0.4 MG SL tablet Commonly known as: NITROSTAT Place 1 tablet (0.4 mg total) under the tongue every 5 (five) minutes x 3 doses as needed for chest pain.   ticagrelor 90 MG Tabs tablet Commonly known as: BRILINTA Take 1 tablet (90 mg total) by mouth 2 (two) times daily.   vitamin C 500 MG tablet Commonly known as: ASCORBIC ACID Take 2,000 mg by mouth daily.        Outstanding Labs/Studies   None   Duration of Discharge Encounter   Greater than 30 minutes including physician time.  Signed, Kathyrn Drown, NP 05/09/2020, 11:48 AM

## 2020-05-09 NOTE — Progress Notes (Signed)
   Progress Note   Subjective   Doing well today, the patient denies CP or SOB.  No new concerns  Inpatient Medications    Scheduled Meds: . aspirin EC  81 mg Oral Daily  . atorvastatin  80 mg Oral Daily  . carvedilol  6.25 mg Oral BID WC  . Chlorhexidine Gluconate Cloth  6 each Topical Daily  . enoxaparin (LOVENOX) injection  40 mg Subcutaneous Q24H  . sodium chloride flush  3 mL Intravenous Q12H  . ticagrelor  90 mg Oral BID   Continuous Infusions: . sodium chloride 20 mL/hr at 05/07/20 0318  . sodium chloride     PRN Meds: sodium chloride, acetaminophen, diazepam, nitroGLYCERIN, ondansetron (ZOFRAN) IV, sodium chloride flush   Vital Signs    Vitals:   05/08/20 2046 05/09/20 0047 05/09/20 0349 05/09/20 0739  BP: (!) 164/92 (!) 145/87 (!) 144/84 (!) 167/97  Pulse: 83 79 79 75  Resp: 16 16 16 18   Temp: 98.1 F (36.7 C) 98.4 F (36.9 C) 98.5 F (36.9 C) 98.1 F (36.7 C)  TempSrc: Oral Oral Oral Oral  SpO2: 94% 91% 90% 94%  Weight:   134.7 kg   Height:        Intake/Output Summary (Last 24 hours) at 05/09/2020 1108 Last data filed at 05/09/2020 0900 Gross per 24 hour  Intake 1480 ml  Output --  Net 1480 ml   Filed Weights   05/07/20 0645 05/08/20 0600 05/09/20 0349  Weight: 133.6 kg 134.1 kg 134.7 kg    Telemetry    sinus - Personally Reviewed  Physical Exam   GEN- The patient is well appearing, alert and oriented x 3 today.   Head- normocephalic, atraumatic Eyes-  Sclera clear, conjunctiva pink Ears- hearing intact Oropharynx- clear Neck- supple, Lungs-  normal work of breathing Heart- Regular rate and rhythm  GI- soft  Extremities- no clubbing, cyanosis, or edema  MS- no significant deformity or atrophy Skin- no rash or lesion Psych- euthymic mood, full affect Neuro- strength and sensation are intact   Labs    Chemistry Recent Labs  Lab 05/07/20 0305 05/07/20 0821 05/08/20 0234  NA 138 139 139  K 3.2* 3.4* 3.7  CL 101 101 103  CO2  26 24 28   GLUCOSE 142* 137* 151*  BUN 20 15 12   CREATININE 0.85 0.78 0.94  CALCIUM 9.0 9.0 8.9  PROT 7.5 7.0  --   ALBUMIN 4.3 4.0  --   AST 37 68*  --   ALT 47* 49*  --   ALKPHOS 62 56  --   BILITOT 0.6 0.6  --   GFRNONAA >60 >60 >60  GFRAA >60 >60 >60  ANIONGAP 11 14 8      Hematology Recent Labs  Lab 05/07/20 0305 05/07/20 0821 05/08/20 0234  WBC 12.2* 11.9* 12.0*  RBC 5.47 5.47 5.15  HGB 16.5 16.2 15.2  HCT 50.0 48.4 46.7  MCV 91.4 88.5 90.7  MCH 30.2 29.6 29.5  MCHC 33.0 33.5 32.5  RDW 12.7 12.7 13.1  PLT 221 237 223      Assessment & Plan    1.  STEMI Doing well s/p PCI Wants to go home Will plan discharge today We discussed importance of medicine adherence, smoking cessation, and cardiac rehab.  Will need close follow-up with Dr Allison Quarry team.   Thompson Grayer MD, [facility] 05/09/2020 11:08 AM

## 2020-05-09 NOTE — Progress Notes (Signed)
CARDIAC REHAB PHASE I   Pt in bed awaiting discharge. Wife at bedside. Pt walking on his own. Reviewed Cardiac Rehab with pt, smoking cessation and exercise guidelines. Pt plans to attend cardiac rehab at [facility].   [id number]    Carma Lair MS, ACSM CEP  11:40 AM 05/09/2020

## 2020-05-11 ENCOUNTER — Telehealth: Payer: Self-pay | Admitting: *Deleted

## 2020-05-11 NOTE — Telephone Encounter (Signed)
  Patient contacted regarding discharge from [facility] on 05/09/2020.  Patient understands to follow up with provider Arnold Long DNP on 5/26 at [facility]. Patient understands discharge instructions? Yes  Patient understands medications and regiment? Yes

## 2020-05-12 ENCOUNTER — Other Ambulatory Visit: Payer: Self-pay | Admitting: *Deleted

## 2020-05-12 NOTE — Patient Outreach (Signed)
[facility]) Care Management  05/12/2020  Gordon Richards 07-Dec-1961 [id number]   Transition of care telephone call  Referral received:05/11/20 Initial outreach:05/12/20 Insurance: [employer]   Initial unsuccessful telephone call to patient's preferred number in order to complete transition of care assessment; no answer, left HIPAA compliant voicemail message requesting return call.   Objective: Per electronic record Gordon Richards  was hospitalized at [facility]  from 5/13-5/15 for Acute ST elevation myocardial infarction  Comorbidities include: Morbid obesity, Hyperlipidemia He was discharged to home on 5/15/21without the need for home health services or DME  Plan: This RNCM will route unsuccessful outreach letter with [place] [employer] pamphlet and 24 hour Nurse Advice Line Magnet to [place] [employer] clinical pool to be mailed to patient's home address. This RNCM will attempt another outreach within 4 business days.  Joylene Draft, RN, BSN  [place] Management Coordinator  [phone]- Mobile [phone]- Toll Free Main Office

## 2020-05-14 LAB — FACTOR 5 LEIDEN

## 2020-05-15 ENCOUNTER — Other Ambulatory Visit: Payer: Self-pay | Admitting: *Deleted

## 2020-05-15 IMAGING — DX DG CHEST 1V PORT
1 series · 2 of 2 positions shown · non-contrast
Comparison: 02/08/2007

CLINICAL DATA: Myocardial infarction, tobacco abuse

EXAM:
PORTABLE CHEST 1 VIEW

[Series 2: chest ap grid · 0.14mm/px · 2 of 2 slices shown]
[im 1/2]
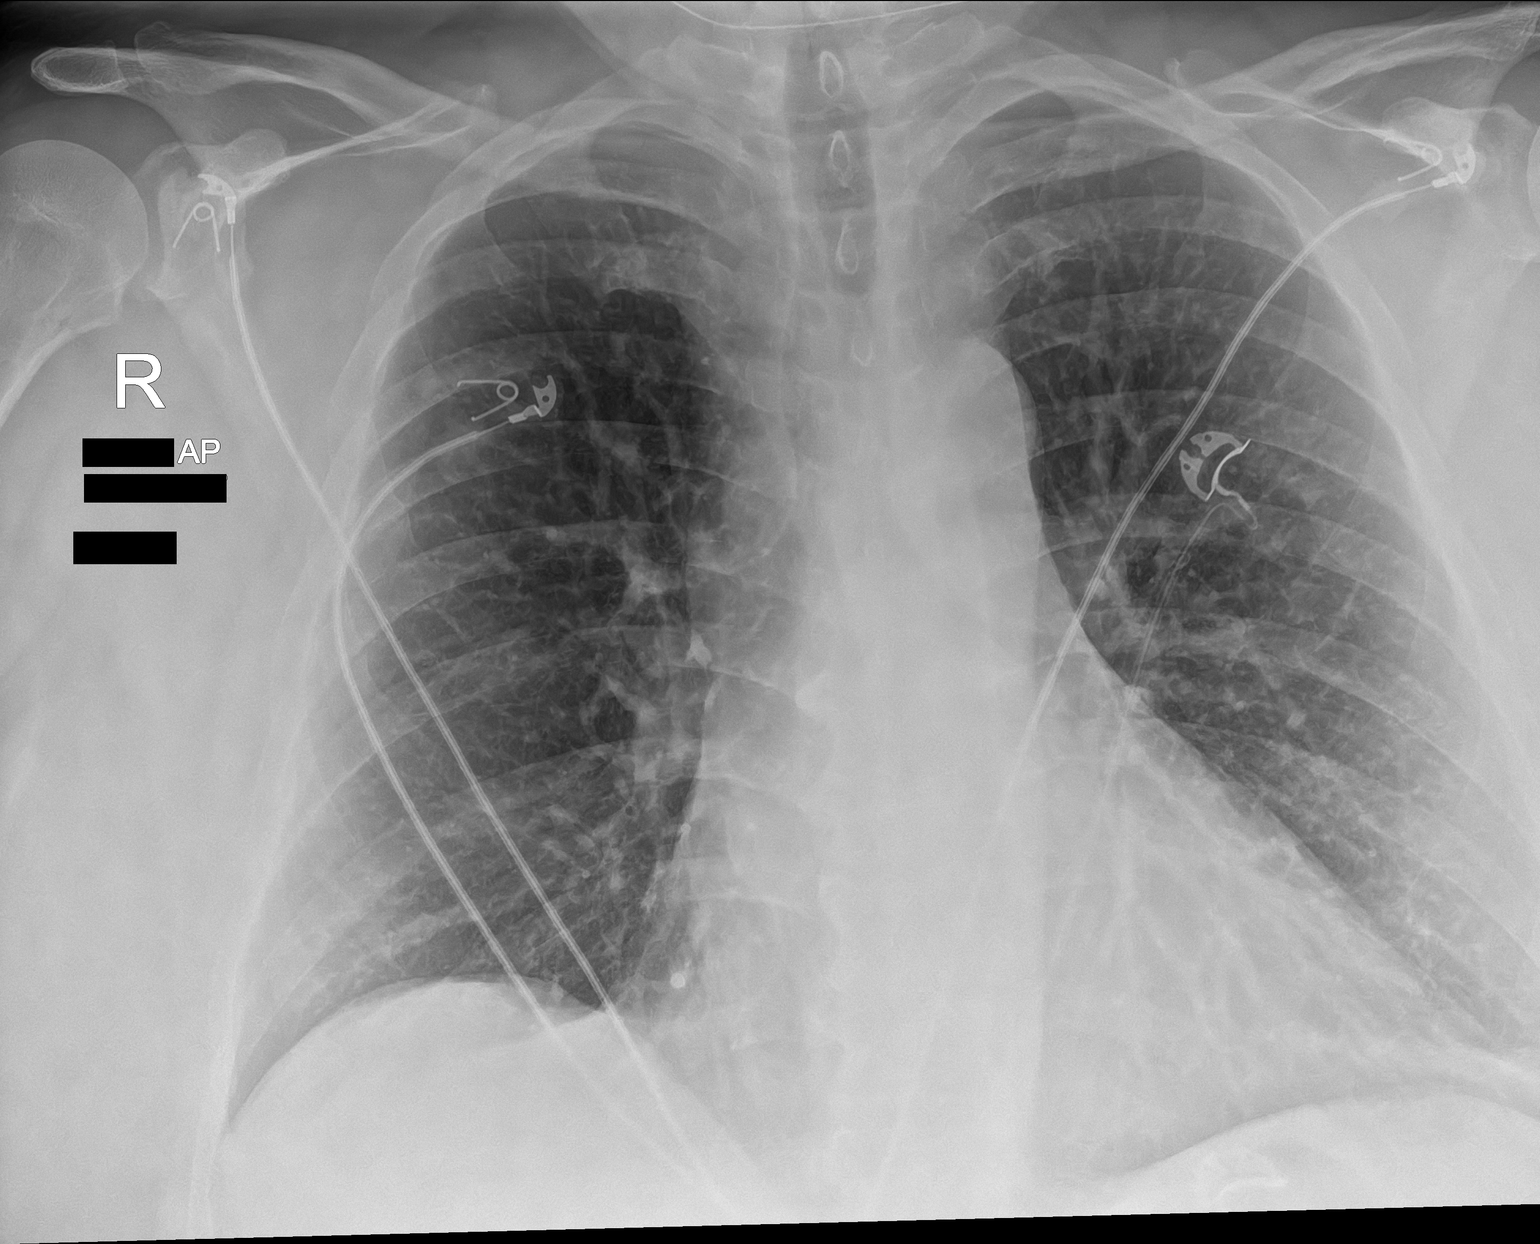
[im 2/2]
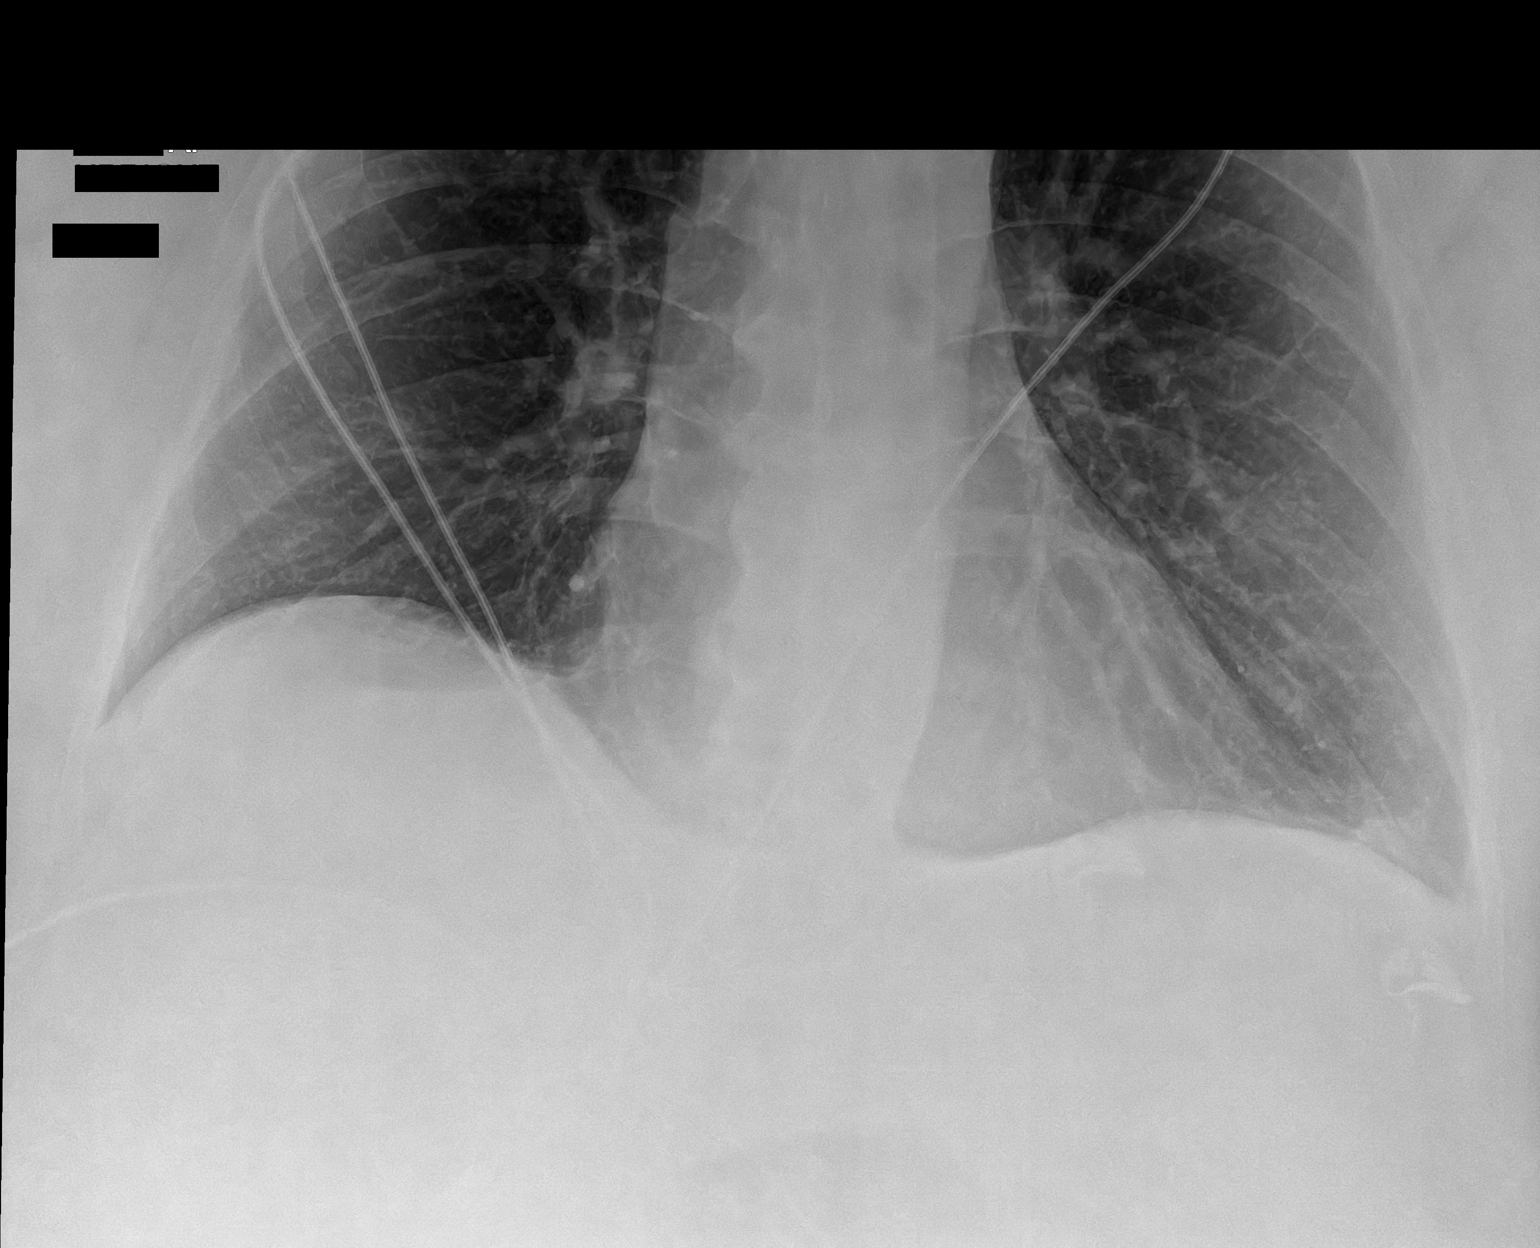

[2 of 2 positions shown; findings below may reference images not displayed]

FINDINGS: The heart size and mediastinal contours are within normal limits.
Both lungs are clear. The visualized skeletal structures are
unremarkable.
IMPRESSION: No active disease.

## 2020-05-15 NOTE — Patient Outreach (Signed)
[facility]) Care Management  05/15/2020  Gordon Richards 1961/07/16 [id number]   Transition of care call Referral received: 05/11/20 Initial outreach attempt: 05/12/20 Insurance: UMR    2nd unsuccessful telephone call to patient's preferred contact number in order to complete post hospital discharge transition of care assessment , no answer left HIPAA compliant message requesting return call.    Objective: Per electronic record Gordon Richards was hospitalized [facility]  from 5/13-5/15 for Acute ST elevation myocardial infarction  Comorbidities include: Morbid obesity, Hyperlipidemia He was discharged to home on 5/15/21without the need for home health servicesor DME   Plan If no return call from patient will attempt 3rd outreach in the next 4 business days.  Joylene Draft, RN, BSN  [place] Management Coordinator  [phone]- Mobile [phone]- Toll Free Main Office

## 2020-05-20 ENCOUNTER — Other Ambulatory Visit: Payer: Self-pay | Admitting: *Deleted

## 2020-05-20 ENCOUNTER — Ambulatory Visit: Payer: 59 | Admitting: Adult Health

## 2020-05-20 NOTE — Progress Notes (Signed)
Cardiology Office Note   Date:  05/21/2020   ID:  Gordon Richards, DOB Oct 24, 1961, MRN [id number]  PCP:  [name], MD  Cardiologist:  Glenetta Hew, MD EP: None  Chief Complaint  Patient presents with   Hospitalization Follow-up      History of Present Illness: Gordon Richards is a 59 y.o. male with CAD s/p recent STEMI 05/05/20 with PCI/DES to RCA and OM1, chronic combined CHF, ischemic cardiomyopathy, HTN, HLD, and tobacco abuse, who presents for post-hospital follow-up.   He was admitted to the hospital from 05/07/20-05/09/20 for an inferior wall STEMI. He was taken to the cath lab emergently which revealed severe 3 vessel CAD with 60% pRCA stenosis, 95% mRCA thrombotic subtotal occlusion (culprit lesion #1), 95% OM1 stenosis (culprit lesion #2), LPAV with 65% stenosis and 99% downstream occlusion unable to cross with wire so likely CTO, and 40% LAD stenosis. He underwent successful PCI/DES to RCA and OM1. Ecoh showed EF 45-50%, indeterminate LV diastolic function, moderate hypokinesis of the mid-apical inferior and lateral wall, and no significant valvular abnormalities. He was started on aspirin and brilinta with plans to continue uninterrupted x1 year, as well as atorvastatin 80mg  daily for LDL of 148, and metoprolol and losartan for HTN management.   Patient presents for post-hospital follow-up with his wife today. He has been doing fairly well since discharge. No recurrent chest pain. No complaints of SOB. He has abstained from tobacco use since his STEMI, for which he was congratulated. He knows he needs to lose weight and is hopeful to be more active in the coming weeks/months. He does have arthritis in his hips/knees, previously managed with NSAIDs, though now has been using Tylenol with less than ideal relief of symptoms. He does have intermittent swelling with some present today which he attributes to eating [place] food last night. We discussed the importance of limiting  salt intake. He has been sleeping poorly. He snores and has insomnia. He attributes his sleeping difficulty to weight gain. No complaints of palpitations, dizziness, lightheadedness, syncope, or bleeding.    Past Medical History:  Diagnosis Date   Acute ST elevation myocardial infarction (STEMI) of inferior wall ([place]) 05/07/2020   CAD S/P DES PCI - pRCA, ostOM1. CTO of AVG Cx 05/08/2020   Hyperlipidemia due to dietary fat intake 05/08/2020   Morbid obesity ([place]) 05/08/2020    Past Surgical History:  Procedure Laterality Date   COLONOSCOPY WITH PROPOFOL N/A 10/26/2015   Procedure: COLONOSCOPY WITH PROPOFOL;  Surgeon: [name], MD;  Location: [facility];  Service: Endoscopy;  Laterality: N/A;  Cecum time in  0748  time out  0758  total time 10 minutes   CORONARY/GRAFT ACUTE MI REVASCULARIZATION N/A 05/07/2020   Procedure: Coronary/Graft Acute MI Revascularization;  Surgeon: Leonie Man, MD;  Location: [place] CV LAB;  Service: Cardiovascular;  Laterality: N/A;   ESOPHAGOGASTRODUODENOSCOPY N/A 10/09/2015   Procedure: ESOPHAGOGASTRODUODENOSCOPY (EGD);  Surgeon: [name], MD;  Location: [facility];  Service: Endoscopy;  Laterality: N/A;   FOOT SURGERY Right    bone cutting   HEMORROIDECTOMY  2003   LEFT HEART CATH AND CORONARY ANGIOGRAPHY N/A 05/07/2020   Procedure: LEFT HEART CATH AND CORONARY ANGIOGRAPHY;  Surgeon: Leonie Man, MD;  Location: [place] CV LAB;  Service: Cardiovascular;  Laterality: N/A;   LIPOMA EXCISION Right 11/08/2013   Procedure: EXCISION NEOPLASM RIGHT ARM;  Surgeon: [name], MD;  Location: [facility] ORS;  Service: General;  Laterality: Right;   MASS EXCISION Right 11/08/2013   Procedure: EXCISION NEOPLASM SCROTUM;  Surgeon: [name], MD;  Location: [facility] ORS;  Service: General;  Laterality: Right;     Current Outpatient Medications  Medication Sig Dispense Refill   aspirin EC 81 MG tablet Take 1 tablet (81 mg total) by mouth  daily.     atorvastatin (LIPITOR) 80 MG tablet Take 1 tablet (80 mg total) by mouth daily. 60 tablet 2   carvedilol (COREG) 12.5 MG tablet Take 1 tablet (12.5 mg total) by mouth 2 (two) times daily with a meal. 120 tablet 2   nitroGLYCERIN (NITROSTAT) 0.4 MG SL tablet Place 1 tablet (0.4 mg total) under the tongue every 5 (five) minutes x 3 doses as needed for chest pain. 25 tablet 0   ticagrelor (BRILINTA) 90 MG TABS tablet Take 1 tablet (90 mg total) by mouth 2 (two) times daily. 120 tablet 2   vitamin C (ASCORBIC ACID) 500 MG tablet Take 2,000 mg by mouth daily.      losartan (COZAAR) 25 MG tablet Take 1 tablet (25 mg total) by mouth daily. 90 tablet 3   No current facility-administered medications for this visit.    Allergies:   Patient has no known allergies.    Social History:  The patient  reports that he has quit smoking. His smoking use included cigarettes. He has a 5.00 pack-year smoking history. He has never used smokeless tobacco. He reports current alcohol use. He reports current drug use. Drug: Marijuana.   Family History:  The patient's family history is not on file.    ROS:  Please see the history of present illness.   Otherwise, review of systems are positive for none.   All other systems are reviewed and negative.    PHYSICAL EXAM: VS:  BP 140/86    Pulse 68    Ht 5\' 10"  (1.778 m)    Wt (!) 300 lb 9.6 oz (136.4 kg)    SpO2 98%    BMI 43.13 kg/m  , BMI Body mass index is 43.13 kg/m. GEN: Well nourished, well developed, in no acute distress HEENT: sclera anicteric Neck: no JVD, carotid bruits, or masses Cardiac: RRR; no murmurs, rubs, or gallops, trace LE edema  Respiratory:  clear to auscultation bilaterally, normal work of breathing GI: soft, nontender, nondistended, + BS MS: no deformity or atrophy Skin: warm and dry, no rash Neuro:  Strength and sensation are intact Psych: euthymic mood, full affect   EKG:  EKG is ordered today. The ekg ordered today  demonstrates NSR, rate 68 bpm, incomplete RBBB, no STE/D   Recent Labs: 05/07/2020: ALT 49; Magnesium 1.8; TSH 2.520 05/08/2020: BUN 12; Creatinine, Ser 0.94; Hemoglobin 15.2; Platelets 223; Potassium 3.7; Sodium 139    Lipid Panel    Component Value Date/Time   CHOL 203 (H) 05/07/2020 0821   TRIG 108 05/07/2020 0821   HDL 33 (L) 05/07/2020 0821   CHOLHDL 6.2 05/07/2020 0821   VLDL 22 05/07/2020 0821   LDLCALC 148 (H) 05/07/2020 0821      Wt Readings from Last 3 Encounters:  05/21/20 (!) 300 lb 9.6 oz (136.4 kg)  05/09/20 297 lb (134.7 kg)  12/14/15 204 lb 4.8 oz (92.7 kg)      Other studies Reviewed: Additional studies/ records that were reviewed today include:   LHC 05/07/20:   There is no aortic valve stenosis.  CULPRIT LESION #1 prox RCA-1 lesion is 60% stenosed. Prox RCA-2 lesion  is 95% stenosed.  A drug-eluting stent was successfully placed covering both lesions using a STENT RESOLUTE ONYX 4.0X34. Postdilated and taper fashion from 5.2-4.6 mm  Post intervention, there is a 0% residual stenosis.  RPAV lesion is 40% stenosed with 40% stenosed side branch in 2nd RPL.  ---  CULPRIT LESION #2 1st Mrg lesion is 95% stenosed.  A drug-eluting stent was successfully placed using a STENT RESOLUTE ONYX 2.75 X 15 -> postdilated to 2.95 mm  Post intervention, there is a 0% residual stenosis.  --  Lat 1st Mrg lesion is 60% stenosed.  LPAV-1 lesion is 65% stenosed.  LPAV-2 lesion is 99% stenosed. -> Unable to cross with wire, likely currently occluded  ---  Prox LAD to Mid LAD lesion is 40% stenosed.  Mid LAD lesion is 40% stenosed.  ---  There is mild left ventricular systolic dysfunction. The left ventricular ejection fraction is 45-50% by visual estimate. LV end diastolic pressure is moderately elevated.  Prox RCA-2 lesion is 95% stenosed.  SUMMARY  SEVERE THREE-VESSEL DISEASE with proximal RCA 60% followed by mid RCA 95% thrombotic subtotal occlusion  (culprit lesion), 99% thrombotic stenosis of large bifurcating OM1 and 70 to 100% likely CTO occlusion of AV groove circumflex (unable to cross). ? Successful DES PCI of proximal to mid RCA using Resolute Onyx 4.0 mm x 34 mm postdilated to 5.2 mm proximal and 4.6 mm distal ? Successful DES PCI of ostial OM1 with Resolute Onyx DES 2.75 mm x15 mm postdilated to 2.95 mm  Poor visualization on left ventriculogram. Would recommend echocardiogram. Could not evaluate wall motion, however EF did not appear to be decreased.  Moderately elevated LVEDP.  Severe systemic hypertension.   RECOMMENDATIONS  Admit to CCU for ongoing care, or on Aggrastat until current bottle complete.  Aggressive risk factor modification-we will check lipid panel start atorvastatin 80 mg daily  Start carvedilol titrate accordingly blood pressure.. Medications ordered.  Smoking cessation counseling  2D echo ordered  With two-vessel PCI large territory involved, likely not a candidate for fast-track discharge.   Echocardiogram 05/07/20:  1. Left ventricular ejection fraction, by estimation, is 45 to 50%. The  left ventricle has mildly decreased function. The left ventricle  demonstrates regional wall motion abnormalities (see scoring  diagram/findings for description). Left ventricular  diastolic parameters are indeterminate. There is moderate hypokinesis of  the left ventricular, mid-apical inferior wall and lateral wall.  2. Right ventricular systolic function is normal. The right ventricular  size is normal.  3. The mitral valve is normal in structure. No evidence of mitral valve  regurgitation. No evidence of mitral stenosis.  4. The aortic valve is tricuspid. Aortic valve regurgitation is not  visualized. No aortic stenosis is present.  5. The inferior vena cava is normal in size with <50% respiratory  variability, suggesting right atrial pressure of 8 mmHg.     ASSESSMENT AND  PLAN:   1. CAD s/p PCI/DES to RCA and OM1 05/05/20: no recurrent chest pain - Continue aspirin and brilinta - Continue statin - Continue carvedilol  2. Chronic combined CHF/ischemic cardiomyopathy: EF 45-50% on echo at the time of his STEMI. No volume overload complaints - Continue carvedilol and losartan - Will check a BMET today for close monitoring of his kidney function and electrolytes.  - Will repeat an echocardiogram in 3 months to monitor for improvement in his EF.   3. HTN: BP 140/86 today - Will increase losartan to 25mg  daily - Continue carvedilol  4.  HLD: LDL 148 05/06/20. Started on atorvastatin - Continue atorvastatin - Will plan to repeat FLP/LFTs in 1 month.   5. Tobacco abuse: he has not smoked since discharge and was congratulated for this achievement.  - Continue to encourage smoking abstinence   6. Suspected OSA: he is morbidly obese with snoring and insomnia.  - Will check a sleep study to evaluate for suspected OSA   Current medicines are reviewed at length with the patient today.  The patient does not have concerns regarding medicines.  The following changes have been made:  As above  Labs/ tests ordered today include:   Orders Placed This Encounter  Procedures   Basic metabolic panel   Lipid panel   Hepatic function panel   EKG 12-Lead   ECHOCARDIOGRAM COMPLETE   Split night study     Disposition:   FU with Dr. Ellyn Hack in 3 months  Signed, Abigail Butts, PA-C  05/21/2020 10:07 PM

## 2020-05-20 NOTE — Patient Outreach (Signed)
[place] [facility]) Care Management  05/20/2020  Gordon Richards September 21, 1961 [id number]   Transition of care call  Referral received: 05/11/20 Initial outreach attempt: 05/12/20 Insurance: [place] unsuccessful telephone call to patient's preferred contact number in order to complete post hospital discharge transition of care assessment; no answer, left HIPAA compliant message requesting return call.   Objective: Per electronic record Gordon Richards hospitalized [facility] from 5/13-5/15for Acute ST elevation myocardial infarctionComorbidities include: Morbid obesity, Hyperlipidemia He was discharged to home on5/15/21without the need for home health servicesor DME   Plan: If no return call from patient, will close case to [place] Management services in 10 business days after initial post hospital discharge outreach, on 05/12/20.  Joylene Draft, RN, BSN  [place] Management Coordinator  [phone]- Mobile [phone]- Toll Free Main Office

## 2020-05-21 ENCOUNTER — Ambulatory Visit: Payer: 59 | Admitting: Medical

## 2020-05-21 ENCOUNTER — Other Ambulatory Visit: Payer: Self-pay

## 2020-05-21 ENCOUNTER — Encounter: Payer: Self-pay | Admitting: Medical

## 2020-05-21 VITALS — BP 140/86 | HR 68 | Ht 70.0 in | Wt 300.6 lb

## 2020-05-21 DIAGNOSIS — I10 Essential (primary) hypertension: Secondary | ICD-10-CM

## 2020-05-21 DIAGNOSIS — I5042 Chronic combined systolic (congestive) and diastolic (congestive) heart failure: Secondary | ICD-10-CM | POA: Diagnosis not present

## 2020-05-21 DIAGNOSIS — E7849 Other hyperlipidemia: Secondary | ICD-10-CM

## 2020-05-21 DIAGNOSIS — Z720 Tobacco use: Secondary | ICD-10-CM | POA: Diagnosis not present

## 2020-05-21 DIAGNOSIS — I2510 Atherosclerotic heart disease of native coronary artery without angina pectoris: Secondary | ICD-10-CM | POA: Diagnosis not present

## 2020-05-21 DIAGNOSIS — I255 Ischemic cardiomyopathy: Secondary | ICD-10-CM | POA: Diagnosis not present

## 2020-05-21 DIAGNOSIS — R0683 Snoring: Secondary | ICD-10-CM | POA: Diagnosis not present

## 2020-05-21 DIAGNOSIS — R29818 Other symptoms and signs involving the nervous system: Secondary | ICD-10-CM

## 2020-05-21 MED ORDER — LOSARTAN POTASSIUM 25 MG PO TABS: 25.0000 mg | ORAL_TABLET | Freq: Every day | ORAL | 3 refills | Status: DC

## 2020-05-21 MED ORDER — LOSARTAN POTASSIUM 25 MG PO TABS
25.0000 mg | ORAL_TABLET | Freq: Every day | ORAL | 3 refills | Status: DC
Start: 2020-05-21 — End: 2020-05-21

## 2020-05-21 NOTE — Patient Instructions (Addendum)
Medication Instructions:  Increase Losartan to 25 mg daily Continue all other medications *If you need a refill on your cardiac medications before your next appointment, please call your pharmacy*    Lab Work: Bmet to be done Fri 5/28 Fasting Lipid and Hepatic panels in 1 month Lab orders enclosed     Testing/Procedures: Echo to be scheduled in 3 months  Sleep Study schedule at [place]: At [facility], you and your health needs are our priority.  As part of our continuing mission to provide you with exceptional heart care, we have created designated Provider Care Teams.  These Care Teams include your primary Cardiologist (physician) and Advanced Practice Providers (APPs -  Physician Assistants and Nurse Practitioners) who all work together to provide you with the care you need, when you need it.  We recommend signing up for the patient portal called "MyChart".  Sign up information is provided on this After Visit Summary.  MyChart is used to connect with patients for Virtual Visits (Telemedicine).  Patients are able to view lab/test results, encounter notes, upcoming appointments, etc.  Non-urgent messages can be sent to your provider as well.   To learn more about what you can do with MyChart, go to [email or web address].    Your next appointment:  3 months  Friday 8/27 at 1:20 pm   The format for your next appointment: Office     Provider: Dr.Harding      Low salt diet guidelines enclosed

## 2020-05-22 ENCOUNTER — Telehealth: Payer: Self-pay | Admitting: Medical

## 2020-05-22 DIAGNOSIS — E7849 Other hyperlipidemia: Secondary | ICD-10-CM | POA: Diagnosis not present

## 2020-05-22 DIAGNOSIS — I5042 Chronic combined systolic (congestive) and diastolic (congestive) heart failure: Secondary | ICD-10-CM | POA: Diagnosis not present

## 2020-05-22 DIAGNOSIS — I10 Essential (primary) hypertension: Secondary | ICD-10-CM | POA: Diagnosis not present

## 2020-05-22 DIAGNOSIS — I255 Ischemic cardiomyopathy: Secondary | ICD-10-CM | POA: Diagnosis not present

## 2020-05-22 DIAGNOSIS — Z720 Tobacco use: Secondary | ICD-10-CM | POA: Diagnosis not present

## 2020-05-22 DIAGNOSIS — I2510 Atherosclerotic heart disease of native coronary artery without angina pectoris: Secondary | ICD-10-CM | POA: Diagnosis not present

## 2020-05-22 NOTE — Telephone Encounter (Signed)
LVM to call and schedule echo... AF

## 2020-05-23 LAB — BASIC METABOLIC PANEL
BUN/Creatinine Ratio: 21 — ABNORMAL HIGH (ref 9–20)
BUN: 17 mg/dL (ref 6–24)
CO2: 24 mmol/L (ref 20–29)
Calcium: 9.4 mg/dL (ref 8.7–10.2)
Chloride: 103 mmol/L (ref 96–106)
Creatinine, Ser: 0.8 mg/dL (ref 0.76–1.27)
GFR calc Af Amer: 115 mL/min/{1.73_m2} (ref >59)
GFR calc non Af Amer: 99 mL/min/{1.73_m2} (ref >59)
Glucose: 92 mg/dL (ref 65–99)
Potassium: 4.6 mmol/L (ref 3.5–5.2)
Sodium: 141 mmol/L (ref 134–144)

## 2020-05-23 LAB — HEPATIC FUNCTION PANEL
ALT: 28 IU/L (ref 0–44)
AST: 16 IU/L (ref 0–40)
Albumin: 4.4 g/dL (ref 3.8–4.9)
Alkaline Phosphatase: 86 IU/L (ref 48–121)
Bilirubin Total: 0.3 mg/dL (ref 0.0–1.2)
Bilirubin, Direct: 0.1 mg/dL (ref 0.00–0.40)
Total Protein: 7 g/dL (ref 6.0–8.5)

## 2020-05-23 LAB — LIPID PANEL
Chol/HDL Ratio: 3.5 ratio (ref 0.0–5.0)
Cholesterol, Total: 116 mg/dL (ref 100–199)
HDL: 33 mg/dL — ABNORMAL LOW (ref >39)
LDL Chol Calc (NIH): 65 mg/dL (ref 0–99)
Triglycerides: 91 mg/dL (ref 0–149)
VLDL Cholesterol Cal: 18 mg/dL (ref 5–40)

## 2020-05-26 ENCOUNTER — Other Ambulatory Visit (HOSPITAL_BASED_OUTPATIENT_CLINIC_OR_DEPARTMENT_OTHER): Payer: Self-pay

## 2020-05-26 ENCOUNTER — Other Ambulatory Visit: Payer: Self-pay | Admitting: *Deleted

## 2020-05-26 NOTE — Patient Outreach (Signed)
[place] [facility]) Care Management  05/26/2020  Gordon Richards 11/05/1961 [id number]   Transition of care /Case Closure Unsuccessful outreach    Referral received:05/11/20 Initial outreach:05/12/20 Insurance: UMR    Unable to complete post hospital discharge transition of care assessment. No return call form patient after 3 call attempts and no response to request to contact RN Care Coordinator in unsuccessful outreach letter mailed to home on 5/15 /21.  Objective: Per electronic record Gordon Richards hospitalized [facility] from 5/13-5/15for Acute ST elevation myocardial infarctionComorbidities include: Morbid obesity, Hyperlipidemia He was discharged to home on5/15/21without the need for home health servicesor DME  Plan Case closed to [place] care management services as it has been 10 days since initial post discharge outreach attempt.   Joylene Draft, RN, BSN  [place] Management Coordinator  [phone]- Mobile [phone]- Toll Free Main Office

## 2020-05-27 MED FILL — LOSARTAN POTASSIUM 25 MG TA: 25 | 90 days supply | Qty: 90 | Fill #0

## 2020-05-28 ENCOUNTER — Other Ambulatory Visit (HOSPITAL_BASED_OUTPATIENT_CLINIC_OR_DEPARTMENT_OTHER): Payer: Self-pay

## 2020-06-02 ENCOUNTER — Other Ambulatory Visit (HOSPITAL_COMMUNITY): Payer: 59

## 2020-06-02 ENCOUNTER — Other Ambulatory Visit (HOSPITAL_COMMUNITY)
Admission: RE | Admit: 2020-06-02 | Discharge: 2020-06-02 | Disposition: A | Discharge status: Home / Self Care | Payer: 59 | Source: Ambulatory Visit | Attending: Cardiovascular Disease | Admitting: Cardiovascular Disease

## 2020-07-09 MED FILL — ATORVASTATIN 80 MG TABLET: 80 | 30 days supply | Qty: 30 | Fill #1

## 2020-07-09 MED FILL — CARVEDILOL 12.5 MG TABLET: 12.5 | 30 days supply | Qty: 60 | Fill #1

## 2020-07-09 MED FILL — BRILINTA 90 MG TABLET: 90 | 30 days supply | Qty: 60 | Fill #1

## 2020-08-03 MED FILL — CARVEDILOL 12.5 MG TABLET: 12.5 | 30 days supply | Qty: 60 | Fill #2

## 2020-08-03 MED FILL — BRILINTA 90 MG TABLET: 90 | 30 days supply | Qty: 60 | Fill #2

## 2020-08-03 MED FILL — ATORVASTATIN 80 MG TABLET: 80 | 30 days supply | Qty: 30 | Fill #2

## 2020-08-09 MED FILL — LOSARTAN POTASSIUM 25 MG TA: 25 | 90 days supply | Qty: 90 | Fill #1

## 2020-08-17 ENCOUNTER — Other Ambulatory Visit (HOSPITAL_COMMUNITY): Payer: 59

## 2020-08-20 ENCOUNTER — Ambulatory Visit
Admission: EM | Admit: 2020-08-20 | Discharge: 2020-08-20 | Disposition: A | Discharge status: Home / Self Care | Payer: 59 | Attending: Emergency Medicine | Admitting: Emergency Medicine

## 2020-08-20 ENCOUNTER — Other Ambulatory Visit: Payer: Self-pay

## 2020-08-20 DIAGNOSIS — J029 Acute pharyngitis, unspecified: Secondary | ICD-10-CM | POA: Insufficient documentation

## 2020-08-20 LAB — POCT RAPID STREP A (OFFICE): Rapid Strep A Screen: NEGATIVE

## 2020-08-20 MED ORDER — DEXAMETHASONE SODIUM PHOSPHATE 10 MG/ML IJ SOLN
10.0000 mg | Freq: Once | INTRAMUSCULAR | Status: AC
  Administered 2020-08-20: 10 mg via INTRAMUSCULAR

## 2020-08-20 NOTE — ED Triage Notes (Signed)
Pt presents with c/o sore throat that began this morning that states had fan on him last night

## 2020-08-20 NOTE — ED Provider Notes (Signed)
[place]   [id number] 08/20/20 Arrival Time: 5397   CC: Sore throat  SUBJECTIVE: History from: patient.  Gordon Richards is a 59 y.o. male who presents with sore throat x this morning.  Denies sick exposure to COVID, flu or strep.  Slept under a fan.  Denies alleviating or aggravating factors.  Reports previous symptoms in the past.   Denies fever, chills, fatigue, sinus pain, rhinorrhea, cough, SOB, wheezing, chest pain, nausea, changes in bowel or bladder habits.    ROS: As per HPI.  All other pertinent ROS negative.     Past Medical History:  Diagnosis Date   Acute ST elevation myocardial infarction (STEMI) of inferior wall ([place]) 05/07/2020   CAD S/P DES PCI - pRCA, ostOM1. CTO of AVG Cx 05/08/2020   Hyperlipidemia due to dietary fat intake 05/08/2020   Morbid obesity ([place]) 05/08/2020   Past Surgical History:  Procedure Laterality Date   COLONOSCOPY WITH PROPOFOL N/A 10/26/2015   Procedure: COLONOSCOPY WITH PROPOFOL;  Surgeon: [name], MD;  Location: [facility];  Service: Endoscopy;  Laterality: N/A;  Cecum time in  0748  time out  0758  total time 10 minutes   CORONARY/GRAFT ACUTE MI REVASCULARIZATION N/A 05/07/2020   Procedure: Coronary/Graft Acute MI Revascularization;  Surgeon: Leonie Man, MD;  Location: [place] CV LAB;  Service: Cardiovascular;  Laterality: N/A;   ESOPHAGOGASTRODUODENOSCOPY N/A 10/09/2015   Procedure: ESOPHAGOGASTRODUODENOSCOPY (EGD);  Surgeon: [name], MD;  [facility];  Service: Endoscopy;  Laterality: N/A;   FOOT SURGERY Right    bone cutting   HEMORROIDECTOMY  2003   LEFT HEART CATH AND CORONARY ANGIOGRAPHY N/A 05/07/2020   Procedure: LEFT HEART CATH AND CORONARY ANGIOGRAPHY;  Surgeon: Leonie Man, MD;  Location: [place] CV LAB;  Service: Cardiovascular;  Laterality: N/A;   LIPOMA EXCISION Right 11/08/2013   Procedure: EXCISION NEOPLASM RIGHT ARM;  Surgeon: [name], MD;  Location: [facility];  Service: General;  Laterality: Right;   MASS EXCISION Right 11/08/2013   Procedure: EXCISION NEOPLASM SCROTUM;  Surgeon: [name], MD;  Location: [facility] OR[facility];  Service: General;  Laterality: Right;   No Known Allergies No current facility-administered medications on file prior to encounter.   Current Outpatient Medications on File Prior to Encounter  Medication Sig Dispense Refill   aspirin EC 81 MG tablet Take 1 tablet (81 mg total) by mouth daily.     atorvastatin (LIPITOR) 80 MG tablet Take 1 tablet (80 mg total) by mouth daily. 60 tablet 2   carvedilol (COREG) 12.5 MG tablet Take 1 tablet (12.5 mg total) by mouth 2 (two) times daily with a meal. 120 tablet 2   losartan (COZAAR) 25 MG tablet Take 1 tablet (25 mg total) by mouth daily. 90 tablet 3   nitroGLYCERIN (NITROSTAT) 0.4 MG SL tablet Place 1 tablet (0.4 mg total) under the tongue every 5 (five) minutes x 3 doses as needed for chest pain. 25 tablet 0   ticagrelor (BRILINTA) 90 MG TABS tablet Take 1 tablet (90 mg total) by mouth 2 (two) times daily. 120 tablet 2   vitamin C (ASCORBIC ACID) 500 MG tablet Take 2,000 mg by mouth daily.      Social History   Socioeconomic History   Marital status: Married    Spouse name: Not on file   Number of children: Not on file   Years of education: Not on file   Highest education level: Not  on file  Occupational History   Not on file  Tobacco Use   Smoking status: Former Smoker    Packs/day: 1.00    Years: 5.00    Pack years: 5.00    Types: Cigarettes   Smokeless tobacco: Never Used  Substance and Sexual Activity   Alcohol use: Yes    Alcohol/week: 0.0 standard drinks    Comment: rarely   Drug use: Yes    Types: Marijuana    Comment: 10/18/15   Sexual activity: Yes    Birth control/protection: None  Other Topics Concern   Not on file  Social History Narrative   Not on file   Social Determinants of Health   Financial Resource Strain:     Difficulty of Paying Living Expenses: Not on file  Food Insecurity:    Worried About Charity fundraiser in the Last Year: Not on file   [employer] of Food in the Last Year: Not on file  Transportation Needs:    Lack of Transportation (Medical): Not on file   Lack of Transportation (Non-Medical): Not on file  Physical Activity:    Days of Exercise per Week: Not on file   Minutes of Exercise per Session: Not on file  Stress:    Feeling of Stress : Not on file  Social Connections:    Frequency of Communication with Friends and Family: Not on file   Frequency of Social Gatherings with Friends and Family: Not on file   Attends Religious Services: Not on file   Active Member of Clubs or Organizations: Not on file   Attends Archivist Meetings: Not on file   Marital Status: Not on file  Intimate Partner Violence:    Fear of Current or Ex-Partner: Not on file   Emotionally Abused: Not on file   Physically Abused: Not on file   Sexually Abused: Not on file   History reviewed. No pertinent family history.  OBJECTIVE:  Vitals:   08/20/20 0831 08/20/20 0833  BP:  117/78  Pulse: 66   Resp: 20   Temp: 98.3 F (36.8 C)   SpO2: 97%     General appearance: alert; mild fatigued appearing, nontoxic; speaking in full sentences and tolerating own secretions HEENT: NCAT; Ears: EACs clear, TMs pearly gray; Eyes: PERRL.  EOM grossly intact.Nose: nares patent without rhinorrhea, Throat: oropharynx clear, tonsils non erythematous or enlarged, uvula midline  Neck: supple without LAD Lungs: unlabored respirations, symmetrical air entry; cough: absent; no respiratory distress; CTAB Heart: regular rate and rhythm.  Skin: warm and dry Psychological: alert and cooperative; normal mood and affect   LABS:  Results for orders placed or performed during the hospital encounter of 08/20/20 (from the past 24 hour(s))  POCT rapid strep A     Status: None   Collection Time: 08/20/20   8:41 AM  Result Value Ref Range   Rapid Strep A Screen Negative Negative     ASSESSMENT & PLAN:  1. Sore throat   2. Viral pharyngitis     Meds ordered this encounter  Medications   dexamethasone (DECADRON) injection 10 mg   Strep negative.  Culture sent.   Declines COVID test Get plenty of rest and push fluids Use OTC zyrtec for nasal congestion, runny nose, and/or sore throat Use OTC flonase for nasal congestion and runny nose Use medications daily for symptom relief Use OTC medications like ibuprofen or tylenol as needed fever or pain Call or go to the ED if you  have any new or worsening symptoms such as fever, cough, shortness of breath, chest tightness, chest pain, turning blue, changes in mental status, etc...   Reviewed expectations re: course of current medical issues. Questions answered. Outlined signs and symptoms indicating need for more acute intervention. Patient verbalized understanding. After Visit Summary given.         Lestine Box, PA-C 08/20/20 [phone]

## 2020-08-20 NOTE — Discharge Instructions (Signed)
Strep negative.  Culture sent.   Declines COVID test Get plenty of rest and push fluids Use OTC zyrtec for nasal congestion, runny nose, and/or sore throat Use OTC flonase for nasal congestion and runny nose Use medications daily for symptom relief Use OTC medications like ibuprofen or tylenol as needed fever or pain Call or go to the ED if you have any new or worsening symptoms such as fever, cough, shortness of breath, chest tightness, chest pain, turning blue, changes in mental status, etc..Marland Kitchen

## 2020-08-21 ENCOUNTER — Encounter: Payer: Self-pay | Admitting: Cardiology

## 2020-08-21 ENCOUNTER — Ambulatory Visit (INDEPENDENT_AMBULATORY_CARE_PROVIDER_SITE_OTHER): Payer: 59 | Admitting: Cardiology

## 2020-08-21 ENCOUNTER — Ambulatory Visit (HOSPITAL_COMMUNITY): Payer: 59 | Attending: Cardiovascular Disease

## 2020-08-21 VITALS — BP 132/80 | HR 60 | Ht 70.0 in | Wt 310.0 lb

## 2020-08-21 DIAGNOSIS — I25110 Atherosclerotic heart disease of native coronary artery with unstable angina pectoris: Secondary | ICD-10-CM

## 2020-08-21 DIAGNOSIS — Z9861 Coronary angioplasty status: Secondary | ICD-10-CM | POA: Diagnosis not present

## 2020-08-21 DIAGNOSIS — I255 Ischemic cardiomyopathy: Secondary | ICD-10-CM

## 2020-08-21 DIAGNOSIS — Z716 Tobacco abuse counseling: Secondary | ICD-10-CM

## 2020-08-21 DIAGNOSIS — E7849 Other hyperlipidemia: Secondary | ICD-10-CM

## 2020-08-21 DIAGNOSIS — Z7189 Other specified counseling: Secondary | ICD-10-CM | POA: Diagnosis not present

## 2020-08-21 DIAGNOSIS — I5042 Chronic combined systolic (congestive) and diastolic (congestive) heart failure: Secondary | ICD-10-CM | POA: Diagnosis not present

## 2020-08-21 DIAGNOSIS — Z720 Tobacco use: Secondary | ICD-10-CM

## 2020-08-21 DIAGNOSIS — I10 Essential (primary) hypertension: Secondary | ICD-10-CM

## 2020-08-21 DIAGNOSIS — I2510 Atherosclerotic heart disease of native coronary artery without angina pectoris: Secondary | ICD-10-CM | POA: Diagnosis not present

## 2020-08-21 HISTORY — PX: TRANSTHORACIC ECHOCARDIOGRAM: SHX275

## 2020-08-21 LAB — ECHOCARDIOGRAM COMPLETE
AR max vel: 2.77 cm2
AV Area VTI: 3.23 cm2
AV Mean grad: 11.9 mmHg
AV Peak grad: 21 mmHg
Ao pk vel: 2.29 m/s
Area-P 1/2: 2.91 cm2
S' Lateral: 2.5 cm

## 2020-08-21 MED ORDER — PERFLUTREN LIPID MICROSPHERE
1.0000 mL | INTRAVENOUS | Status: AC | PRN
  Administered 2020-08-21: 2 mL via INTRAVENOUS

## 2020-08-21 NOTE — Patient Instructions (Addendum)
Medication Instructions:  No changes *If you need a refill on your cardiac medications before your next appointment, please call your pharmacy*   Lab Work: cmp Lipid - fasting  If you have labs (blood work) drawn today and your tests are completely normal, you will receive your results only by: Marland Kitchen MyChart Message (if you have MyChart) OR . A paper copy in the mail If you have any lab test that is abnormal or we need to change your treatment, we will call you to review the results.   Testing/Procedures: Not needed   Follow-Up: At [facility], you and your health needs are our priority.  As part of our continuing mission to provide you with exceptional heart care, we have created designated Provider Care Teams.  These Care Teams include your primary Cardiologist (physician) and Advanced Practice Providers (APPs -  Physician Assistants and Nurse Practitioners) who all work together to provide you with the care you need, when you need it.  We recommend signing up for the patient portal called "MyChart".  Sign up information is provided on this After Visit Summary.  MyChart is used to connect with patients for Virtual Visits (Telemedicine).  Patients are able to view lab/test results, encounter notes, upcoming appointments, etc.  Non-urgent messages can be sent to your provider as well.   To learn more about what you can do with MyChart, go to [email or web address].    Your next appointment:   4 month(s)  The format for your next appointment:   In Person  Provider:   Glenetta Hew, MD

## 2020-08-21 NOTE — Progress Notes (Signed)
Primary Care Provider: Patient, No Pcp Per Cardiologist: Glenetta Hew, MD Electrophysiologist: None  Clinic Note: Chief Complaint  Patient presents with  . Follow-up    Second post MI follow-up  . Coronary Artery Disease    No angina  . Obesity    Acknowledges that he is 112 pounds above his baseline weight of 200 pounds from 2 years ago.  Marland Kitchen Hyperlipidemia   HPI:    Gordon Richards is a 59 y.o. male with a PMH below who presents today for 21-month follow-up after his initial post hospital visit inferior STEMI.  CAD Hx: 5/13-15/2021 - Inferior STEMI - 3 V CAD: LPAV 65%-99% (unable to Cross, likely CTO), pRCA 60%*& mRCA throbotic 95% (Culprit Lesion) -> DES PCI, 95% OM1 (DES PCI), 40% LAD. EF ~45-505 - Mod HK of mid-apical Inferior& Lateral walls.   Gordon Richards was last seen on May 21, 2020 by Roby Lofts, PA -> doing fairly well.  No chest pain or dyspnea.  Successfully abstained from smoking cigarettes - congratulated.  Acknowledges need for weight loss (stating that he is 100 pound higher than he used to be).  Less than ideal relief of OA Sx with Tylenol (usually uses NSAIDS - held 2/2 DAPT).  ->  Intermittent edema.  Often related to inappropriate diet (for instance [place] food the previously evening) -> notes sleeping poorly, insomnia and snoring.  Increase losartan to 25 mg; follow-up lipid/CMP & echocardiogram ordered.  Sleep study ordered (not done).  Recent Hospitalizations:   08/20/2020: ER visit for viral pharyngitis  Reviewed  CV studies:    The following studies were reviewed today: (if available, images/films reviewed: From Epic Chart or Care Everywhere)  Cardiac Cath (05/07/2020): pRCA 60%& mRCA 95% (Resolute Onyx DES 4.0 mm x 34 mm -> postdilated from 5.2-4.6 mm), RPAV 40% -RPL 2; OM1 95% (Resolute Onyx DES 2.75 mm x15 mm-2.95 mm), OM1 60%, small caliber LP AV 65% and 99% granulated with no loss (likely CTO); p-mLAD 40%, mLAD 40%.  EF 45 to 50%.  Moderate elevated  LVEDP (with severe systemic hypertension).   TTE (05/07/2020): EF 45-50%.  Moderate HK of the mid-apical inferior and lateral wall.  Otherwise normal.  TTE 08/21/2020: Preliminary read appears to have improved EF up to 65%.  Inferior wall motion states it is improved.  Formal read pending @ time of visit.  EF 65-70%. Gr 2 DD. No RWMA. Mild Asc Ao dilation - 39 mm. Normal valves. Normal RV. Normal Atriae.  Interval History:   Gordon Richards returns here today for cardiology follow-up.  He says he is too busy to do cardiac rehab.  He really has not been exercising on a much.  As was the case in the hospital, he seems very cavalier about his weight gain and how he simply lost his way, having gained 100 pounds since retirement.  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  Despite this, he decided not to go to cardiac rehab.  With the limited amount of activity that he does, he is not having any chest pain or pressure with rest or exertion.  He has maintained abstinent from cigarettes which I congratulated him on.  About the only thing he notices occasional headache. He did not follow through with sleep study.  CV Review of Symptoms (Summary) Cardiovascular ROS: no chest pain or dyspnea on exertion positive for - Headache with some sleep  issues and insomnia, snoring and daytime sleepiness. negative for - edema, irregular heartbeat, orthopnea, palpitations, paroxysmal nocturnal dyspnea, rapid heart rate, shortness of breath or Syncope/near syncope, TIA/amaurosis fugax, claudication  The patient does not have symptoms concerning for COVID-19 infection (fever, chills, cough, or new shortness of breath).  The patient is practicing social distancing & Masking.    REVIEWED OF SYSTEMS   Review of Systems  Constitutional: Negative for malaise/fatigue and weight loss (Despite recommendations for weight loss, he has gained 10 pounds since  last visit.).  HENT: Negative for congestion and nosebleeds.   Respiratory: Negative for cough and shortness of breath.   Cardiovascular: Positive for leg swelling (If he still on thing).  Gastrointestinal: Negative for abdominal pain, blood in stool, constipation and melena.  Genitourinary: Negative for hematuria.  Musculoskeletal: Positive for joint pain (Arthritis pains).  Neurological: Negative for dizziness, focal weakness, weakness and headaches.  Psychiatric/Behavioral: Negative for depression and memory loss. The patient is not nervous/anxious and does not have insomnia.     I have reviewed and (if needed) personally updated the patient's problem list, medications, allergies, past medical and surgical history, social and family history.   PAST MEDICAL HISTORY   Past Medical History:  Diagnosis Date  . Acute ST elevation myocardial infarction (STEMI) of inferior wall (HCC) 05/07/2020   Likely culprit lesion was 95% thrombotic mRCA (in tandem with pRCA 60%) - Lesion #1 DES PCI; OM1 also had 95% lesion (porential culprit - Lesion #2 DES PCI); ~CTO of AVG Cx after OM1 (unable to cross). EF 45-50% - Mod LVEDP with severe systemic HTN.   . CAD S/P DES PCI - pRCA, ostOM1. CTO of AVG Cx 05/08/2020    pRCA 60%& mRCA 95% (Resolute Onyx DES 4.0 mm x 34 mm -> postdilated from 5.2-4.6 mm), RPAV 40% -RPL 2; OM1 95% (Resolute Onyx DES 2.75 mm x15 mm-2.95 mm), OM1 60%, small caliber LP AV 65% and 99% granulated with no loss (likely CTO); p-mLAD 40%, mLAD 40%.  . Hyperlipidemia due to dietary fat intake 05/08/2020  . Morbid obesity ([place]) 05/08/2020    PAST SURGICAL HISTORY   Past Surgical History:  Procedure Laterality Date  . COLONOSCOPY WITH PROPOFOL N/A 10/26/2015   Procedure: COLONOSCOPY WITH PROPOFOL;  Surgeon: [name], MD;  Location: [facility];  Service: Endoscopy;  Laterality: N/A;  [place] time in  0748  time out  0758  total time 10 minutes  . CORONARY/GRAFT ACUTE MI REVASCULARIZATION  N/A 05/07/2020   Procedure: Coronary/Graft Acute MI Revascularization;  Surgeon: Leonie Man, MD;  Location: [facility];; INF STEMI: Lesions #1: pRCA 60%& mRCA 95% (covering both: Resolute Onyx DES 4.0 mm x 34 mm -> post-dil from 5.2-4.6 mm); Lesion #2: OM1 95% (Resolute Onyx DES 2.75 mm x15 mm-2.95 mm),  . ESOPHAGOGASTRODUODENOSCOPY N/A 10/09/2015   Procedure: ESOPHAGOGASTRODUODENOSCOPY (EGD);  Surgeon: [name], MD;  Location: [facility];  Service: Endoscopy;  Laterality: N/A;  . FOOT SURGERY Right    bone cutting  . HEMORROIDECTOMY  2003  . LEFT HEART CATH AND CORONARY ANGIOGRAPHY N/A 05/07/2020   Procedure: LEFT HEART CATH AND CORONARY ANGIOGRAPHY;  Surgeon: Leonie Man, MD;  Location: [facility];;  pRCA 60%& mRCA 95% (Lesion #1 DES PCI), RPAV 40% -RPL 2; OM1 95% (Lesion #2 DES PCI), OM1 60%; small caliber LP AV 65% and 99% unable to cannulate (likely CTO); p-mLAD 40%, mLAD 40%. EF 45-50%. Mod elevated LVEDP  . LIPOMA EXCISION  Right 11/08/2013   Procedure: EXCISION NEOPLASM RIGHT ARM;  Surgeon: [name], MD;  Location: [facility];  Service: General;  Laterality: Right;  . MASS EXCISION Right 11/08/2013   Procedure: EXCISION NEOPLASM SCROTUM;  Surgeon: [name], MD;  Location: [facility];  Service: General;  Laterality: Right;  . TRANSTHORACIC ECHOCARDIOGRAM  05/07/2020   In setting of inferior STEMI: EF 45-50%.  Moderate HK of the mid-apical inferior and lateral wall.  Otherwise normal.  . TRANSTHORACIC ECHOCARDIOGRAM  08/21/2020   EF 65-70%. Gr 2 DD. No RWMA. Mild Asc Ao dilation - 39 mm. Normal valves. Normal RV. Normal Atriae.    MEDICATIONS/ALLERGIES   Current Meds  Medication Sig  . aspirin EC 81 MG tablet Take 1 tablet (81 mg total) by mouth daily.  Marland Kitchen atorvastatin (LIPITOR) 80 MG tablet Take 1 tablet (80 mg total) by mouth daily.  . carvedilol (COREG) 12.5 MG tablet Take 1 tablet (12.5 mg total) by mouth 2 (two) times daily with a meal.  .  Cholecalciferol (VITAMIN D3) 25 MCG (1000 UT) CAPS Take by mouth.  . nitroGLYCERIN (NITROSTAT) 0.4 MG SL tablet Place 1 tablet (0.4 mg total) under the tongue every 5 (five) minutes x 3 doses as needed for chest pain.  . ticagrelor (BRILINTA) 90 MG TABS tablet Take 1 tablet (90 mg total) by mouth 2 (two) times daily.  . vitamin C (ASCORBIC ACID) 500 MG tablet Take 2,000 mg by mouth daily.     No Known Allergies  SOCIAL HISTORY/FAMILY HISTORY   Reviewed in Epic:  Social History   Tobacco Use  . Smoking status: Former Smoker    Packs/day: 1.00    Years: 5.00    Pack years: 5.00    Types: Cigarettes  . Smokeless tobacco: Never Used  Substance Use Topics  . Alcohol use: Yes    Alcohol/week: 0.0 standard drinks    Comment: rarely  . Drug use: Yes    Types: Marijuana    Comment: 10/18/15   Social History   Social History Narrative   He lives with his wife.   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   family history includes Healthy in his mother; Heart attack in his maternal uncle; Heart attack (age of onset: 17) in his paternal uncle; Heart attack (age of onset: 82) in his maternal grandfather; Parkinson's disease (age of onset: 54) in his paternal grandfather.   OBJCTIVE -PE, EKG, labs   Wt Readings from Last 3 Encounters:  08/21/20 (!) 310 lb (140.6 kg)  05/21/20 (!) 300 lb 9.6 oz (136.4 kg)  05/09/20 297 lb (134.7 kg)    Physical Exam: BP 132/80 (BP Location: Right Arm, Patient Position: Sitting, Cuff Size: Large)   Pulse 60   Ht 5\' 10"  (1.778 m)   Wt (!) 310 lb (140.6 kg)   BMI 44.48 kg/m  Physical Exam Vitals reviewed.  Constitutional:      General: He is not in acute distress.    Appearance: He is not ill-appearing.     Comments: Morbidly obese.  Well-groomed.  HENT:     Head: Normocephalic and atraumatic.  Neck:     Vascular: No carotid bruit.     Comments: Unable to assess JVP or HJR due to body habitus. Cardiovascular:      Rate and Rhythm: Normal rate and regular rhythm.  No extrasystoles are present.    Chest Wall: PMI is not displaced (Unable to assess due to body habitus).  Pulses: Decreased pulses (Due to body habitus).     Heart sounds: S1 normal and S2 normal. Heart sounds are distant. No friction rub (Unable to assess). No gallop. No S4 sounds.   Pulmonary:     Effort: Pulmonary effort is normal. No respiratory distress.     Breath sounds: Normal breath sounds.     Comments: Distant lung sounds, nonlabored Abdominal:     General: Bowel sounds are normal. There is no distension.     Palpations: Abdomen is soft.     Comments: Truncal obesity.  I was unable to assess HSM.  Musculoskeletal:        General: Swelling (Probable mild swelling, cannot tell because of body habitus.) present. Normal range of motion.     Cervical back: Normal range of motion.  Neurological:     General: No focal deficit present.     Mental Status: He is alert and oriented to person, place, and time.  Psychiatric:        Mood and Affect: Mood normal.     Comments: I question his judgment regarding his health.  All the concerns about Covid vaccine, but yet he is willing to allow himself to gain 110 pounds with the last 10 pounds being since his MI. ->  Declined cardiac rehab because he is "too busy " & has not gone through sleep study     Adult ECG Report Not checked  Recent Labs: Despite being ordered, not followed through on. Lab Results  Component Value Date   CHOL 116 05/22/2020   HDL 33 (L) 05/22/2020   LDLCALC 65 05/22/2020   TRIG 91 05/22/2020   CHOLHDL 3.5 05/22/2020   Lab Results  Component Value Date   CREATININE 0.80 05/22/2020   BUN 17 05/22/2020   NA 141 05/22/2020   K 4.6 05/22/2020   CL 103 05/22/2020   CO2 24 05/22/2020   Lab Results  Component Value Date   TSH 2.520 05/07/2020    ASSESSMENT/PLAN    Problem List Items Addressed This Visit    Coronary artery disease involving native  coronary artery of native heart with unstable angina pectoris ([place]) - Primary (Chronic)    Thankfully, no further angina.  I suspect that the occluded LCx was relatively insignificant based on the dramatic improvement of his EF post PCI of the RCA and OM.  Plan:  Continue DAPT as noted in PCI section  Continue high-dose high intensity statin (atorvastatin 80 mg) -> follow-up labs  Continue carvedilol and losartan, anticipate increasing losartan to 50 mg if pressure continues to be at this current level next or higher visit.  Congratulated him on his efforts with smoking cessation.      CAD S/P DES PCI - pRCA, ostOM1. CTO of AVG Cx (Chronic)    4 months out from two-vessel PCI in the setting of inferior lateral STEMI.  Plan:   Continue aspirin uninterrupted for least the first 6 months after which we can probably have him stop in order to allow him to use as needed NSAIDs  Continue uninterrupted Brilinta until June 2022 at which point we will can reduce to maintenance dose for following year.      Relevant Orders   Lipid panel   Comprehensive metabolic panel   Hyperlipidemia due to dietary fat intake (Chronic)    He was supposed to have had lipids drawn prior to this visit.  Not done.  We will reorder now in order to determine if he can potentially  reduce his atorvastatin dose.  Needs to lose weight--healthy diet and exercise      Relevant Orders   Lipid panel   Comprehensive metabolic panel   Morbid obesity ([place])    Quite amazingly, he gained 10 pounds since his last visit.  He went right back into the lifestyle that he was living prior to his MI without changing anything besides smoking cessation.  Perhaps the smoking cessation again 10 pounds, but now needs to become acutely aware of his major risk being his weight.  The patient understands the need to lose weight with diet and exercise. We have discussed specific strategies for this.  Unfortunately, he is probably too  far out from his MI tachycardic rehab.      Relevant Orders   Lipid panel   Comprehensive metabolic panel   Educated about COVID-19 virus infection     COVID-19 Education: The signs and symptoms of COVID-19 were discussed with the patient and how to seek care for testing (follow up with PCP or arrange E-visit).    The importance of social distancing and COVID-19 vaccination was discussed today.  Talk for additional 10 minutes simply about the concept of Covid vaccine and risks and benefits.  The patient and his wife (actually at [employer] employee) were initially very reluctant to consider proceeding with vaccination, but now they will definitely take it into consideration.      Encounter for smoking cessation counseling    Thankfully, all of 1 major steps that he has made since his MI was to stop smoking.  Unfortunately, he has gained 10 pounds associated with that.  I congratulated his efforts.  He seems to be doing okay without requiring additional assistance.         ----- I spent a total of 40 minutes with the patient spent in direct patient consultation.  --> See discussion above about COVID-19 immunization recommendations.  We also talked about weight loss and diet.  We talked about his understanding of how significant his disease is. Additional time spent with chart review  / charting (studies, outside notes, etc): 25 -> Extensive chart review including personally reviewing echocardiogram performed today not yet read.  I also personally completed past medical history/surgical and family history. Total Time: 65 min Current medicines are reviewed at length with the patient today.  (+/- concerns) N/A  Notice: This dictation was prepared with Dragon dictation along with smaller phrase technology. Any transcriptional errors that result from this process are unintentional and may not be corrected upon review.  Patient Instructions / Medication Changes & Studies & Tests Ordered    Patient Instructions  Medication Instructions:  No changes *If you need a refill on your cardiac medications before your next appointment, please call your pharmacy*   Lab Work: cmp Lipid - fasting  If you have labs (blood work) drawn today and your tests are completely normal, you will receive your results only by: Marland Kitchen MyChart Message (if you have MyChart) OR . A paper copy in the mail If you have any lab test that is abnormal or we need to change your treatment, we will call you to review the results.   Testing/Procedures: Not needed   Follow-Up: At [facility], you and your health needs are our priority.  As part of our continuing mission to provide you with exceptional heart care, we have created designated Provider Care Teams.  These Care Teams include your primary Cardiologist (physician) and Advanced Practice Providers (APPs -  Physician  Assistants and Nurse Practitioners) who all work together to provide you with the care you need, when you need it.  We recommend signing up for the patient portal called "MyChart".  Sign up information is provided on this After Visit Summary.  MyChart is used to connect with patients for Virtual Visits (Telemedicine).  Patients are able to view lab/test results, encounter notes, upcoming appointments, etc.  Non-urgent messages can be sent to your provider as well.   To learn more about what you can do with MyChart, go to [email or web address].    Your next appointment:   4 month(s)  The format for your next appointment:   In Person  Provider:   Glenetta Hew, MD       Studies Ordered:   Orders Placed This Encounter  Procedures  . Lipid panel  . Comprehensive metabolic panel     Glenetta Hew, M.D., M.S. Interventional Cardiologist   Pager # [phone] Phone # [phone] [address] [place], [place] [address]   Thank you for choosing Heartcare at [facility]!!

## 2020-08-22 ENCOUNTER — Encounter: Payer: Self-pay | Admitting: Cardiology

## 2020-08-23 LAB — CULTURE, GROUP A STREP (THRC)

## 2020-08-31 ENCOUNTER — Encounter: Payer: Self-pay | Admitting: Cardiology

## 2020-08-31 DIAGNOSIS — Z716 Tobacco abuse counseling: Secondary | ICD-10-CM | POA: Insufficient documentation

## 2020-08-31 DIAGNOSIS — Z7189 Other specified counseling: Secondary | ICD-10-CM | POA: Insufficient documentation

## 2020-08-31 DIAGNOSIS — Z87891 Personal history of nicotine dependence: Secondary | ICD-10-CM | POA: Insufficient documentation

## 2020-08-31 NOTE — Assessment & Plan Note (Signed)
Thankfully, all of 1 major steps that he has made since his MI was to stop smoking.  Unfortunately, he has gained 10 pounds associated with that.  I congratulated his efforts.  He seems to be doing okay without requiring additional assistance.

## 2020-08-31 NOTE — Assessment & Plan Note (Signed)
Quite amazingly, he gained 10 pounds since his last visit.  He went right back into the lifestyle that he was living prior to his MI without changing anything besides smoking cessation.  Perhaps the smoking cessation again 10 pounds, but now needs to become acutely aware of his major risk being his weight.  The patient understands the need to lose weight with diet and exercise. We have discussed specific strategies for this.  Unfortunately, he is probably too far out from his MI tachycardic rehab.

## 2020-08-31 NOTE — Assessment & Plan Note (Signed)
   COVID-19 Education: The signs and symptoms of COVID-19 were discussed with the patient and how to seek care for testing (follow up with PCP or arrange E-visit).    The importance of social distancing and COVID-19 vaccination was discussed today.  Talk for additional 10 minutes simply about the concept of Covid vaccine and risks and benefits.  The patient and his wife (actually at [employer] employee) were initially very reluctant to consider proceeding with vaccination, but now they will definitely take it into consideration.

## 2020-08-31 NOTE — Assessment & Plan Note (Signed)
Thankfully, no further angina.  I suspect that the occluded LCx was relatively insignificant based on the dramatic improvement of his EF post PCI of the RCA and OM.  Plan:  Continue DAPT as noted in PCI section  Continue high-dose high intensity statin (atorvastatin 80 mg) -> follow-up labs  Continue carvedilol and losartan, anticipate increasing losartan to 50 mg if pressure continues to be at this current level next or higher visit.  Congratulated him on his efforts with smoking cessation.

## 2020-08-31 NOTE — Assessment & Plan Note (Signed)
4 months out from two-vessel PCI in the setting of inferior lateral STEMI.  Plan:   Continue aspirin uninterrupted for least the first 6 months after which we can probably have him stop in order to allow him to use as needed NSAIDs  Continue uninterrupted Brilinta until June 2022 at which point we will can reduce to maintenance dose for following year.

## 2020-08-31 NOTE — Assessment & Plan Note (Signed)
He was supposed to have had lipids drawn prior to this visit.  Not done.  We will reorder now in order to determine if he can potentially reduce his atorvastatin dose.  Needs to lose weight--healthy diet and exercise

## 2020-09-03 MED FILL — CARVEDILOL 12.5 MG TABLET: 12.5 | 30 days supply | Qty: 60 | Fill #3

## 2020-09-03 MED FILL — ATORVASTATIN 80 MG TABLET: 80 | 30 days supply | Qty: 30 | Fill #3

## 2020-09-08 MED FILL — BRILINTA 90 MG TABLET: 90 | 30 days supply | Qty: 60 | Fill #3

## 2020-10-02 MED FILL — CARVEDILOL 12.5 MG TABLET: 12.5 | 30 days supply | Qty: 60 | Fill #4

## 2020-10-02 MED FILL — ATORVASTATIN 80 MG TABLET: 80 | 30 days supply | Qty: 30 | Fill #4

## 2020-11-02 ENCOUNTER — Other Ambulatory Visit: Payer: Self-pay | Admitting: Cardiology

## 2020-11-03 ENCOUNTER — Other Ambulatory Visit: Payer: Self-pay | Admitting: Cardiology

## 2020-11-03 MED FILL — CARVEDILOL 12.5 MG TABLET: 12.5 | 30 days supply | Qty: 60 | Fill #0

## 2020-11-03 MED FILL — ATORVASTATIN 80 MG TABLET: 80 | 30 days supply | Qty: 30 | Fill #0

## 2020-11-03 NOTE — Telephone Encounter (Signed)
This is Dr. Harding's pt. °

## 2020-11-10 ENCOUNTER — Other Ambulatory Visit: Payer: Self-pay | Admitting: Cardiology

## 2020-11-10 MED FILL — BRILINTA 90 MG TABLET: 90 | 30 days supply | Qty: 60 | Fill #0

## 2020-11-18 MED FILL — LOSARTAN POTASSIUM 25 MG TA: 25 | 90 days supply | Qty: 90 | Fill #2

## 2020-11-25 ENCOUNTER — Encounter: Payer: Self-pay | Admitting: Emergency Medicine

## 2020-11-25 ENCOUNTER — Ambulatory Visit
Admission: EM | Admit: 2020-11-25 | Discharge: 2020-11-25 | Disposition: A | Discharge status: Home / Self Care | Payer: 59 | Attending: Emergency Medicine | Admitting: Emergency Medicine

## 2020-11-25 DIAGNOSIS — M62830 Muscle spasm of back: Secondary | ICD-10-CM

## 2020-11-25 DIAGNOSIS — M549 Dorsalgia, unspecified: Secondary | ICD-10-CM | POA: Diagnosis not present

## 2020-11-25 MED ORDER — PREDNISONE 10 MG (21) PO TBPK: ORAL_TABLET | Freq: Every day | ORAL | 0 refills | Status: DC

## 2020-11-25 MED ORDER — DEXAMETHASONE SODIUM PHOSPHATE 10 MG/ML IJ SOLN
10.0000 mg | Freq: Once | INTRAMUSCULAR | Status: AC
  Administered 2020-11-25: 10 mg via INTRAMUSCULAR

## 2020-11-25 MED ORDER — CYCLOBENZAPRINE HCL 10 MG PO TABS: 10.0000 mg | ORAL_TABLET | Freq: Every day | ORAL | 0 refills | Status: DC

## 2020-11-25 NOTE — Discharge Instructions (Signed)
Continue conservative management of rest, ice, and gentle stretches Prednisone prescribed.  Take as directed and to completion Take cyclobenzaprine at nighttime for symptomatic relief. Avoid driving or operating heavy machinery while using medication. Follow up with PCP if symptoms persist Return or go to the ER if you have any new or worsening symptoms (fever, chills, chest pain, abdominal pain, changes in bowel or bladder habits, pain radiating into lower legs, etc...)  

## 2020-11-25 NOTE — ED Provider Notes (Signed)
[place]   [id number] 11/25/20 Arrival Time: 6962  CC: Back PAIN  SUBJECTIVE: History from: patient. Gordon Richards is a 59 y.o. male complains of LT mid back pain x 1 day.  Denies a precipitating event or specific injury.  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  Localizes the pain to the LT mid back.  Describes the pain as intermittent and throbbing in character.  Has tried OTC medications without relief.  Symptoms are made worse with movement.  Denies similar symptoms in the past.  Denies fever, chills, erythema, ecchymosis, effusion, weakness, numbness and tingling, saddle paresthesias, loss of bowel or bladder function.      ROS: As per HPI.  All other pertinent ROS negative.     Past Medical History:  Diagnosis Date  . Acute ST elevation myocardial infarction (STEMI) of inferior wall (HCC) 05/07/2020   Likely culprit lesion was 95% thrombotic mRCA (in tandem with pRCA 60%) - Lesion #1 DES PCI; OM1 also had 95% lesion (porential culprit - Lesion #2 DES PCI); ~CTO of AVG Cx after OM1 (unable to cross). EF 45-50% - Mod LVEDP with severe systemic HTN.   . CAD S/P DES PCI - pRCA, ostOM1. CTO of AVG Cx 05/08/2020    pRCA 60%& mRCA 95% (Resolute Onyx DES 4.0 mm x 34 mm -> postdilated from 5.2-4.6 mm), RPAV 40% -RPL 2; OM1 95% (Resolute Onyx DES 2.75 mm x15 mm-2.95 mm), OM1 60%, small caliber LP AV 65% and 99% granulated with no loss (likely CTO); p-mLAD 40%, mLAD 40%.  . Hyperlipidemia due to dietary fat intake 05/08/2020  . Morbid obesity ([place]) 05/08/2020   Past Surgical History:  Procedure Laterality Date  . COLONOSCOPY WITH PROPOFOL N/A 10/26/2015   Procedure: COLONOSCOPY WITH PROPOFOL;  Surgeon: [name], MD;  Location: [facility];  Service: Endoscopy;  Laterality: N/A;  Cecum time in  0748  time out  0758  total time 10 minutes  . CORONARY/GRAFT ACUTE MI REVASCULARIZATION N/A 05/07/2020   Procedure: Coronary/Graft Acute MI  Revascularization;  Surgeon: Leonie Man, MD;  Location: [place] [facility];; INF STEMI: Lesions #1: pRCA 60%& mRCA 95% (covering both: Resolute Onyx DES 4.0 mm x 34 mm -> post-dil from 5.2-4.6 mm); Lesion #2: OM1 95% (Resolute Onyx DES 2.75 mm x15 mm-2.95 mm),  . ESOPHAGOGASTRODUODENOSCOPY N/A 10/09/2015   Procedure: ESOPHAGOGASTRODUODENOSCOPY (EGD);  Surgeon: [name], MD;  Location: [facility];  Service: Endoscopy;  Laterality: N/A;  . FOOT SURGERY Right    bone cutting  . HEMORROIDECTOMY  2003  . LEFT HEART CATH AND CORONARY ANGIOGRAPHY N/A 05/07/2020   Procedure: LEFT HEART CATH AND CORONARY ANGIOGRAPHY;  Surgeon: Leonie Man, MD;  Location: [facility];;  pRCA 60%& mRCA 95% (Lesion #1 DES PCI), RPAV 40% -RPL 2; OM1 95% (Lesion #2 DES PCI), OM1 60%; small caliber LP AV 65% and 99% unable to cannulate (likely CTO); p-mLAD 40%, mLAD 40%. EF 45-50%. Mod elevated LVEDP  . LIPOMA EXCISION Right 11/08/2013   Procedure: EXCISION NEOPLASM RIGHT ARM;  Surgeon: [name], MD;  Location: [facility];  Service: General;  Laterality: Right;  . MASS EXCISION Right 11/08/2013   Procedure: EXCISION NEOPLASM SCROTUM;  Surgeon: [name], MD;  Location: [facility];  Service: General;  Laterality: Right;  . TRANSTHORACIC ECHOCARDIOGRAM  05/07/2020   In setting of inferior STEMI: EF 45-50%.  Moderate HK of the mid-apical inferior and lateral wall.  Otherwise normal.  . TRANSTHORACIC ECHOCARDIOGRAM  08/21/2020   EF 65-70%. Gr 2 DD. No RWMA. Mild Asc Ao dilation - 39 mm. Normal valves. Normal RV. Normal Atriae.   No Known Allergies No current facility-administered medications on file prior to encounter.   Current Outpatient Medications on File Prior to Encounter  Medication Sig Dispense Refill  . aspirin EC 81 MG tablet Take 1 tablet (81 mg total) by mouth daily.    Marland Kitchen atorvastatin (LIPITOR) 80 MG tablet TAKE 1 TABLET BY MOUTH DAILY. 30 tablet 0  . BRILINTA 90 MG TABS tablet TAKE 1  TABLET BY MOUTH TWICE DAILY. 60 tablet 8  . carvedilol (COREG) 12.5 MG tablet TAKE 1 TABLET BY MOUTH TWICE DAILY WITH A MEAL. 60 tablet 0  . Cholecalciferol (VITAMIN D3) 25 MCG (1000 UT) CAPS Take by mouth.    . losartan (COZAAR) 25 MG tablet Take 1 tablet (25 mg total) by mouth daily. 90 tablet 3  . nitroGLYCERIN (NITROSTAT) 0.4 MG SL tablet Place 1 tablet (0.4 mg total) under the tongue every 5 (five) minutes x 3 doses as needed for chest pain. 25 tablet 0  . vitamin C (ASCORBIC ACID) 500 MG tablet Take 2,000 mg by mouth daily.      Social History   Socioeconomic History  . Marital status: Married    Spouse name: Not on file  . Number of children: Not on file  . Years of education: Not on file  . Highest education level: Not on file  Occupational History    Employer: [employer]  Tobacco Use  . Smoking status: Former Smoker    Packs/day: 1.00    Years: 5.00    Pack years: 5.00    Types: Cigarettes  . Smokeless tobacco: Never Used  Substance and Sexual Activity  . Alcohol use: Yes    Alcohol/week: 0.0 standard drinks    Comment: rarely  . Drug use: Yes    Types: Marijuana    Comment: 10/18/15  . Sexual activity: Yes    Birth control/protection: None  Other Topics Concern  . Not on file  Social History Narrative   He lives with his wife.   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   Social Determinants of Health   Financial Resource Strain:   . Difficulty of Paying Living Expenses: Not on file  Food Insecurity:   . Worried About Charity fundraiser in the Last Year: Not on file  . Ran Out of Food in the Last Year: Not on file  Transportation Needs:   . Lack of Transportation (Medical): Not on file  . Lack of Transportation (Non-Medical): Not on file  Physical Activity:   . Days of Exercise per Week: Not on file  . Minutes of Exercise per Session: Not on file  Stress:   . Feeling of Stress : Not on file  Social Connections:   .  Frequency of Communication with Friends and Family: Not on file  . Frequency of Social Gatherings with Friends and Family: Not on file  . Attends Religious Services: Not on file  . Active Member of Clubs or Organizations: Not on file  . Attends Archivist Meetings: Not on file  . Marital Status: Not on file  Intimate Partner Violence:   . Fear of Current or Ex-Partner: Not on file  . Emotionally Abused: Not on file  . Physically Abused: Not on file  . Sexually Abused: Not on file  Family History  Problem Relation Age of Onset  . Healthy Mother   . Heart attack Maternal Grandfather 70  . Parkinson's disease Paternal Grandfather 75  . Heart attack Maternal Uncle   . Heart attack Paternal Uncle 90    OBJECTIVE:  Vitals:   11/25/20 1050  BP: (!) 168/89  Pulse: 75  Resp: 20  Temp: 98.5 F (36.9 C)  TempSrc: Oral  SpO2: 96%    General appearance: ALERT; appears uncomfortable holding back Head: NCAT Lungs: Normal respiratory effort Musculoskeletal: Back  Inspection: Skin warm, dry, clear and intact without obvious erythema, effusion, or ecchymosis.  Palpation: TTP over LT mid back ROM: LROM Strength: 5/5 shld abduction, 5/5 shld adduction, 5/5 elbow flexion, 5/5 elbow extension, 5/5 grip strength, 5/5 hip flexion, 5/5 hip extension Skin: warm and dry Neurologic: Ambulates without difficulty; Sensation intact about the upper/ lower extremities Psychological: alert and cooperative; normal mood and affect  ASSESSMENT & PLAN:  1. Mid back pain on left side   2. Back muscle spasm      Meds ordered this encounter  Medications  . predniSONE (STERAPRED UNI-PAK 21 TAB) 10 MG (21) TBPK tablet    Sig: Take by mouth daily. Take 6 tabs by mouth daily  for 2 days, then 5 tabs for 2 days, then 4 tabs for 2 days, then 3 tabs for 2 days, 2 tabs for 2 days, then 1 tab by mouth daily for 2 days    Dispense:  42 tablet    Refill:  0    Order Specific Question:   Supervising  Provider    Answer:   Raylene Everts [[id number]]  . cyclobenzaprine (FLEXERIL) 10 MG tablet    Sig: Take 1 tablet (10 mg total) by mouth at bedtime.    Dispense:  15 tablet    Refill:  0    Order Specific Question:   Supervising Provider    Answer:   Raylene Everts [[id number]]  . dexamethasone (DECADRON) injection 10 mg    Continue conservative management of rest, ice, and gentle stretches Prednisone prescribed.  Take as directed and to completion Take cyclobenzaprine at nighttime for symptomatic relief. Avoid driving or operating heavy machinery while using medication. Follow up with PCP if symptoms persist Return or go to the ER if you have any new or worsening symptoms (fever, chills, chest pain, abdominal pain, changes in bowel or bladder habits, pain radiating into lower legs, etc...)   Reviewed expectations re: course of current medical issues. Questions answered. Outlined signs and symptoms indicating need for more acute intervention. Patient verbalized understanding. After Visit Summary given.    Lestine Box, PA-C 11/25/20 1103

## 2020-11-25 NOTE — ED Triage Notes (Signed)
Pain to LT flank area that started this morning. Pt states he has some lipomas  that popped up. Pt states he took some expired pain med pills and tylenol around 0930 and has had no pain relief.

## 2020-12-02 ENCOUNTER — Other Ambulatory Visit: Payer: Self-pay | Admitting: Cardiology

## 2020-12-03 ENCOUNTER — Other Ambulatory Visit: Payer: Self-pay | Admitting: Cardiology

## 2020-12-03 MED FILL — ATORVASTATIN 80 MG TABLET: 80 | 30 days supply | Qty: 30 | Fill #0

## 2020-12-11 ENCOUNTER — Other Ambulatory Visit: Payer: Self-pay | Admitting: Cardiology

## 2020-12-11 MED FILL — CARVEDILOL 12.5 MG TABLET: 12.5 | 30 days supply | Qty: 60 | Fill #0

## 2020-12-16 MED FILL — BRILINTA 90 MG TABLET: 90 | 90 days supply | Qty: 180 | Fill #1

## 2020-12-17 ENCOUNTER — Ambulatory Visit: Payer: 59 | Admitting: Cardiology

## 2021-01-02 MED FILL — ATORVASTATIN 80 MG TABLET: 80 | 30 days supply | Qty: 30 | Fill #1

## 2021-01-07 ENCOUNTER — Other Ambulatory Visit: Payer: Self-pay | Admitting: Cardiology

## 2021-01-07 MED FILL — CARVEDILOL 12.5 MG TABLET: 12.5 | 30 days supply | Qty: 60 | Fill #0

## 2021-01-14 ENCOUNTER — Other Ambulatory Visit: Payer: Self-pay | Admitting: Cardiology

## 2021-01-14 ENCOUNTER — Other Ambulatory Visit: Payer: Self-pay

## 2021-01-14 MED ORDER — CARVEDILOL 12.5 MG PO TABS: ORAL_TABLET | ORAL | 3 refills | Status: DC

## 2021-01-21 DIAGNOSIS — Z9861 Coronary angioplasty status: Secondary | ICD-10-CM | POA: Diagnosis not present

## 2021-01-21 DIAGNOSIS — I2510 Atherosclerotic heart disease of native coronary artery without angina pectoris: Secondary | ICD-10-CM | POA: Diagnosis not present

## 2021-01-21 DIAGNOSIS — E7849 Other hyperlipidemia: Secondary | ICD-10-CM | POA: Diagnosis not present

## 2021-01-22 LAB — COMPREHENSIVE METABOLIC PANEL
ALT: 112 IU/L — ABNORMAL HIGH (ref 0–44)
AST: 65 IU/L — ABNORMAL HIGH (ref 0–40)
Albumin/Globulin Ratio: 1.4 (ref 1.2–2.2)
Albumin: 4.4 g/dL (ref 3.8–4.9)
Alkaline Phosphatase: 77 IU/L (ref 44–121)
BUN/Creatinine Ratio: 13 (ref 9–20)
BUN: 13 mg/dL (ref 6–24)
Bilirubin Total: 0.4 mg/dL (ref 0.0–1.2)
CO2: 23 mmol/L (ref 20–29)
Calcium: 9.5 mg/dL (ref 8.7–10.2)
Chloride: 102 mmol/L (ref 96–106)
Creatinine, Ser: 1.01 mg/dL (ref 0.76–1.27)
GFR calc Af Amer: 94 mL/min/{1.73_m2} (ref >59)
GFR calc non Af Amer: 82 mL/min/{1.73_m2} (ref >59)
Globulin, Total: 3.1 g/dL (ref 1.5–4.5)
Glucose: 101 mg/dL — ABNORMAL HIGH (ref 65–99)
Potassium: 4.6 mmol/L (ref 3.5–5.2)
Sodium: 142 mmol/L (ref 134–144)
Total Protein: 7.5 g/dL (ref 6.0–8.5)

## 2021-01-22 LAB — LIPID PANEL
Chol/HDL Ratio: 5.9 ratio — ABNORMAL HIGH (ref 0.0–5.0)
Cholesterol, Total: 165 mg/dL (ref 100–199)
HDL: 28 mg/dL — ABNORMAL LOW (ref >39)
LDL Chol Calc (NIH): 85 mg/dL (ref 0–99)
Triglycerides: 315 mg/dL — ABNORMAL HIGH (ref 0–149)
VLDL Cholesterol Cal: 52 mg/dL — ABNORMAL HIGH (ref 5–40)

## 2021-01-27 ENCOUNTER — Other Ambulatory Visit: Payer: Self-pay

## 2021-01-27 ENCOUNTER — Ambulatory Visit (INDEPENDENT_AMBULATORY_CARE_PROVIDER_SITE_OTHER): Payer: 59 | Admitting: Cardiology

## 2021-01-27 ENCOUNTER — Encounter: Payer: Self-pay | Admitting: Cardiology

## 2021-01-27 ENCOUNTER — Other Ambulatory Visit: Payer: Self-pay | Admitting: Cardiology

## 2021-01-27 VITALS — BP 132/88 | HR 65 | Ht 70.0 in | Wt 324.0 lb

## 2021-01-27 DIAGNOSIS — I2510 Atherosclerotic heart disease of native coronary artery without angina pectoris: Secondary | ICD-10-CM | POA: Diagnosis not present

## 2021-01-27 DIAGNOSIS — Z9861 Coronary angioplasty status: Secondary | ICD-10-CM | POA: Diagnosis not present

## 2021-01-27 DIAGNOSIS — I25118 Atherosclerotic heart disease of native coronary artery with other forms of angina pectoris: Secondary | ICD-10-CM | POA: Diagnosis not present

## 2021-01-27 DIAGNOSIS — Z01810 Encounter for preprocedural cardiovascular examination: Secondary | ICD-10-CM | POA: Insufficient documentation

## 2021-01-27 DIAGNOSIS — I2119 ST elevation (STEMI) myocardial infarction involving other coronary artery of inferior wall: Secondary | ICD-10-CM

## 2021-01-27 DIAGNOSIS — I25110 Atherosclerotic heart disease of native coronary artery with unstable angina pectoris: Secondary | ICD-10-CM

## 2021-01-27 DIAGNOSIS — E7849 Other hyperlipidemia: Secondary | ICD-10-CM

## 2021-01-27 MED ORDER — EZETIMIBE 10 MG PO TABS: 10.0000 mg | ORAL_TABLET | Freq: Every day | ORAL | 3 refills | Status: DC

## 2021-01-27 MED ORDER — LOSARTAN POTASSIUM 50 MG PO TABS: 50.0000 mg | ORAL_TABLET | Freq: Every day | ORAL | 3 refills | Status: DC

## 2021-01-27 MED FILL — EZETIMIBE 10 MG TABS: 10 | 90 days supply | Qty: 90 | Fill #0

## 2021-01-27 NOTE — Patient Instructions (Addendum)
Medication Instructions:    start taking Zetia ( generic ) 10 mg one tablet daily   Increase losartan to 50 mg daily   ( you may take 2 ( 25 mg tablet to complete your current bottle of medications  *If you need a refill on your cardiac medications before your next appointment, please call your pharmacy*   Lab Work:  in May 2022 - fasting LIPID LIVER  HGBA1c  If you have labs (blood work) drawn today and your tests are completely normal, you will receive your results only by: Marland Kitchen MyChart Message (if you have MyChart) OR . A paper copy in the mail If you have any lab test that is abnormal or we need to change your treatment, we will call you to review the results.   Testing/Procedures: Not needed   Follow-Up: At [facility], you and your health needs are our priority.  As part of our continuing mission to provide you with exceptional heart care, we have created designated Provider Care Teams.  These Care Teams include your primary Cardiologist (physician) and Advanced Practice Providers (APPs -  Physician Assistants and Nurse Practitioners) who all work together to provide you with the care you need, when you need it.     Your next appointment:   3 month(s) May 2022  The format for your next appointment:   In Person  Provider:   Glenetta Hew, MD   Other Instructions  Your physician encouraged you to lose weight for better health.    in you need to have urgent dental surgery - it would be okay to hold Brilinta for 5 days prior to dental surgery  But however with the least risk it would be better to delay as close to May 2022 as possible.

## 2021-01-27 NOTE — Progress Notes (Signed)
Primary Care Provider: Pcp, No Cardiologist: Bryan Lemma, MD Electrophysiologist: None  Clinic Note: Chief Complaint  Patient presents with   Follow-up    Gained another 14 pounds-short of breath because of obesity and deconditioning; no chest pain   Coronary Artery Disease    Less than 8 months out from major MI with 2 vessel PCI-no angina, morbidly obese   ===================================  ASSESSMENT/PLAN   Problem List Items Addressed This Visit    ST elevation myocardial infarction (STEMI) of inferior wall (HCC) (Chronic)    Decent sized inferior STEMI with RCA PCI followed by circumflex PCI.  He has moderate disease in several other places and essentially occluded distal circumflex. Despite this, he clearly has not gained understanding of self-control.  At the time of his MI, he lacked about having gained 100 pounds in a year, he has subsequently gained over 24 pounds after having an MI. I counseled him quite strenuously on this.  I indicated that it is quite frustrating.  Thankfully, his EF is back to normal.  No angina or heart failure symptoms.      Relevant Medications   ezetimibe (ZETIA) 10 MG tablet   losartan (COZAAR) 50 MG tablet   Other Relevant Orders   EKG 12-Lead (Completed)   Hepatic function panel   Lipid panel   Hemoglobin A1c   Coronary artery disease involving coronary bypass graft of native heart with angina pectoris (HCC) - Primary (Chronic)    He had multivessel disease, 2 vessels were occlusive, several areas of 40 to 60% stenosis and occluded distal circumflex.  Not really doing his part post MI.  Plan:   Add Zetia to his 80 mg atorvastatin  Strenuous counseling about diet, exercise absolute need for weight loss.  Increase losartan to 50 mg;   continue carvedilol 12.5 mg twice daily  Continue DAPT for now.        Relevant Medications   ezetimibe (ZETIA) 10 MG tablet   losartan (COZAAR) 50 MG tablet   CAD S/P DES PCI - pRCA,  ostOM1. CTO of AVG Cx (Chronic)    8 months post inferior STEMI with PCI to RCA and LCx-OM.  Plan:  Original plan was to consider stopping aspirin as of his May 2022 appointment  Continue uninterrupted Brilinta-90 mg twice daily until May 2022, at which time we will reduce to 60 mg twice daily maintenance dose for long-term therapy.  He asked about need for potentially having surgery.  If urgent surgery required, he is now 8 months out, and if absolutely necessary could hold Brilinta and aspirin 5 days preop for surgery procedure.  Otherwise, it would be best for him to wait until at least May at which time it would be safer to hold antiplatelet agents.   As of mid May 2022, plan is to DC aspirin and reduce Brilinta to 60 mg twice daily which can be interrupted (held 5 days preop for surgeries or procedures.).      Relevant Medications   ezetimibe (ZETIA) 10 MG tablet   losartan (COZAAR) 50 MG tablet   Other Relevant Orders   EKG 12-Lead (Completed)   Hepatic function panel   Lipid panel   Hemoglobin A1c   Morbid obesity (HCC) (Chronic)    He is now gained 24 pounds since his MI.  14 pounds since last visit.  Clearly, the counseling done at last visit did not work.  I gave him some credit for smoking cessation being a potential reason for additional  weight gain, but now that is no longer an issue.  He has multiple excuses/reasons for having put on weight, but has no plans for how we will lose weight.  His plan is to get back into the [facility] and start to exercise.  He needs also monitor his diet.  If he is unable to to control himself, my plan will be to refer him for nutrition counseling at the [facility].      Relevant Orders   Hepatic function panel   Lipid panel   Hemoglobin A1c   Hyperlipidemia due to dietary fat intake (Chronic)    Amazingly, on 80 mg of atorvastatin, his LDL went from 65 up to 85.  Triglycerides 315, indicative of dietary  discretion.  I counseled him again on importance of dietary modification.  To this he giggled, and says that he will do better  Plan: Add Zetia 10 mg daily-recheck by May along with A1c      Relevant Medications   ezetimibe (ZETIA) 10 MG tablet   losartan (COZAAR) 50 MG tablet   Other Relevant Orders   Hepatic function panel   Lipid panel   Hemoglobin A1c   Preop cardiovascular exam    He may have to have endodontic surgery.  Thankfully, he seems to doing fine as far as no active anginal symptoms or heart failure symptoms.  The surgery be relatively low risk although it may require general anesthesia.  With preserved EF on echo, he should be fine from a cardiac standpoint, but more concerning would be as respiratory status with his obesity.  I would prefer for him to wait as long as he can-is close to me as possible), to minimize risk of stopping antiplatelet agents.  If urgent surgical treatment is necessary for recurrent abscess (he is currently on antibiotics for endodontic abscess), he may require surgery, at which point he must understand the risks of stopping antiplatelet agents may potentially be outweighed by the risk of avoiding surgery.  In that case, holding antiplatelet agents for 5 days would be reasonable.      Relevant Orders   EKG 12-Lead (Completed)      ===================================  HPI:    Gordon Richards is a 60 y.o. male with a PMH notable for inferior STEMI-(May 07, 2020 -> PCI to RCA with staged PCI to OM1 and 99% likely CTO of LP AV), Ischemic CM EF of 45%, HTN and HLD along with obesity who presents here for follow-up.  Gordon Richards was last seen on August 21, 2020.  Indicated he was "too busy" to do cardiac rehab.  Was quite cavalier about his significant weight gain.  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  Thankfully, he has successfully quit smoking cigarettes.  He never did follow-up with  sleep study.  He had actually gained 10 pounds.  No medication changes.  Lipid panel ordered.  Recent Hospitalizations:   11/25/2020: ER visit for left-sided back pain-felt a muscle sprain.  Reviewed  CV studies:    The following studies were reviewed today: (if available, images/films reviewed: From Epic Chart or Care Everywhere)  TTE 08/21/2020: EF 65 to 70%.  No R WMA.  GRII DD.Marland Kitchen  Otherwise normal atrial sizes, normal RV size and function.  Normal valves.   Interval History:   Gordon Richards returns here today having gained yet again another 14 pound since his last visit.  (24 pounds  total since his MI).  He makes plenty of excuses as to why he has gained weight.  He had a scare with a Covid (omicron) a while ago.  He was self quarantining although he never tested.  He has had ongoing issues with tooth pain, he has had chronic abscesses related to having molars removed.  He is thinking he may need to have surgery (endodontal surgery). Last month he strained his back.  He has not been overexert himself.  Probably because he has yet to get active with any Exercise, he has exertional dyspnea.  He is hoping to get back into the [employer] to do exercise now that his back is feeling better, but is having to deal with his tooth pain.  He has not had any chest pain with any rest or exertion.  He has no PND orthopnea, although sometimes lying down he will get short of breath because of obesity.  He is sleeps on a chair.  He also laughingly blames his wife for being a good cook serving him cakes and pies.  She feels that this.  (Clearly not understanding the severity of having coronary disease and having had a heart attack).  CV Review of Symptoms (Summary) Cardiovascular ROS: positive for - dyspnea on exertion, edema, orthopnea and Weight gain as opposed to loss, fatigue and orthopnea because of obesity and deconditioning. negative for - chest pain, irregular heartbeat, palpitations, paroxysmal  nocturnal dyspnea, rapid heart rate or Syncope or near syncope or TIA/amaurosis fugax or claudication  He also has other sleep issues including insomnia, snoring and daytime sleepiness.  The patient recently had some persistent symptoms concerning for COVID-19 infection (fever, chills, cough, or new shortness of breath); but has been fine for the last month or so.  He did not test  REVIEWED OF SYSTEMS   Review of Systems  Constitutional: Positive for malaise/fatigue (Lazy, not doing anything). Negative for weight loss (Unbelievably 24 pound weight gain as opposed to loss.).  HENT: Negative for congestion and nosebleeds.   Respiratory: Positive for shortness of breath (Bending over, and with exertion because of deconditioning).        He had recent URI symptoms.  Now all cleared up  Gastrointestinal: Negative for blood in stool and melena.  Genitourinary: Negative for hematuria.  Musculoskeletal: Positive for back pain and joint pain. Negative for falls.  Neurological: Positive for dizziness (With bending over). Negative for focal weakness and weakness.  Psychiatric/Behavioral: Negative for depression and memory loss. The patient has insomnia. The patient is not nervous/anxious.    I have reviewed and (if needed) personally updated the patient's problem list, medications, allergies, past medical and surgical history, social and family history.   PAST MEDICAL HISTORY   Past Medical History:  Diagnosis Date   Acute ST elevation myocardial infarction (STEMI) of inferior wall ([place]) 05/07/2020   Likely culprit lesion was 95% thrombotic mRCA (in tandem with pRCA 60%) - Lesion #1 DES PCI; OM1 also had 95% lesion (porential culprit - Lesion #2 DES PCI); ~CTO of AVG Cx after OM1 (unable to cross). EF 45-50% - Mod LVEDP with severe systemic HTN.    CAD S/P DES PCI - pRCA, ostOM1. CTO of AVG Cx 05/08/2020    pRCA 60%& mRCA 95% (Resolute Onyx DES 4.0 mm x 34 mm -> postdilated from 5.2-4.6 mm), RPAV  40% -RPL 2; OM1 95% (Resolute Onyx DES 2.75 mm x15 mm-2.95 mm), OM1 60%, small caliber LP AV 65% and 99% granulated with no loss (  likely CTO); p-mLAD 40%, mLAD 40%.   Hyperlipidemia due to dietary fat intake 05/08/2020   Morbid obesity (HCC) 05/08/2020    PAST SURGICAL HISTORY   Past Surgical History:  Procedure Laterality Date   COLONOSCOPY WITH PROPOFOL N/A 10/26/2015   Procedure: COLONOSCOPY WITH PROPOFOL;  Surgeon: Malissa Hippo, MD;  Location: [facility];  Service: Endoscopy;  Laterality: N/A;  Cecum time in  0748  time out  0758  total time 10 minutes   CORONARY/GRAFT ACUTE MI REVASCULARIZATION N/A 05/07/2020   Procedure: Coronary/Graft Acute MI Revascularization;  Surgeon: Marykay Lex, MD;  Location: [facility];; INF STEMI: Lesions #1: pRCA 60%& mRCA 95% (covering both: Resolute Onyx DES 4.0 mm x 34 mm -> post-dil from 5.2-4.6 mm); Lesion #2: OM1 95% (Resolute Onyx DES 2.75 mm x15 mm-2.95 mm),   ESOPHAGOGASTRODUODENOSCOPY N/A 10/09/2015   Procedure: ESOPHAGOGASTRODUODENOSCOPY (EGD);  Surgeon: Malissa Hippo, MD;  Location: [facility];  Service: Endoscopy;  Laterality: N/A;   FOOT SURGERY Right    bone cutting   HEMORROIDECTOMY  2003   LEFT HEART CATH AND CORONARY ANGIOGRAPHY N/A 05/07/2020   Procedure: LEFT HEART CATH AND CORONARY ANGIOGRAPHY;  Surgeon: Marykay Lex, MD;  Location: [facility];;  pRCA 60%& mRCA 95% (Lesion #1 DES PCI), RPAV 40% -RPL 2; OM1 95% (Lesion #2 DES PCI), OM1 60%; small caliber LP AV 65% and 99% unable to cannulate (likely CTO); p-mLAD 40%, mLAD 40%. EF 45-50%. Mod elevated LVEDP   LIPOMA EXCISION Right 11/08/2013   Procedure: EXCISION NEOPLASM RIGHT ARM;  Surgeon: Dalia Heading, MD;  Location: [facility];  Service: General;  Laterality: Right;   MASS EXCISION Right 11/08/2013   Procedure: EXCISION NEOPLASM SCROTUM;  Surgeon: Dalia Heading, MD;  Location: [facility];  Service: General;  Laterality: Right;   TRANSTHORACIC  ECHOCARDIOGRAM  05/07/2020   In setting of inferior STEMI: EF 45-50%.  Moderate HK of the mid-apical inferior and lateral wall.  Otherwise normal.   TRANSTHORACIC ECHOCARDIOGRAM  08/21/2020   EF 65-70%. Gr 2 DD. No RWMA. Mild Asc Ao dilation - 39 mm. Normal valves. Normal RV. Normal Atriae.    Cardiac Cath (05/07/2020): pRCA 60%& mRCA 95% (Resolute Onyx DES 4.0 mm x 34 mm -> postdilated from 5.2-4.6 mm), RPAV 40% -RPL 2; OM1 95% (Resolute Onyx DES 2.75 mm x15 mm-2.95 mm), OM1 60%, small caliber LP AV 65% and 99% - unable to cross (likely CTO); p-mLAD 40%, mLAD 40%.  EF 45 to 50%.  Moderate elevated LVEDP (with severe systemic hypertension).   There is no immunization history on file for this patient.  MEDICATIONS/ALLERGIES   Current Meds  Medication Sig   aspirin EC 81 MG tablet Take 1 tablet (81 mg total) by mouth daily.   atorvastatin (LIPITOR) 80 MG tablet TAKE 1 TABLET BY MOUTH ONCE A DAY   BRILINTA 90 MG TABS tablet TAKE 1 TABLET BY MOUTH TWICE DAILY.   carvedilol (COREG) 12.5 MG tablet TAKE 1 TABLET BY MOUTH 2 TIMES DAILY WITH MEALS   Cholecalciferol (VITAMIN D3) 25 MCG (1000 UT) CAPS Take by mouth.   vitamin C (ASCORBIC ACID) 500 MG tablet Take 2,000 mg by mouth daily.    [DISCONTINUED] ezetimibe (ZETIA) 10 MG tablet Take 1 tablet (10 mg total) by mouth daily.   [DISCONTINUED] losartan (COZAAR) 25 MG tablet Take 1 tablet (25 mg total) by mouth daily.   [DISCONTINUED] losartan (COZAAR) 50 MG tablet Take 1 tablet (50 mg  total) by mouth daily.    No Known Allergies  SOCIAL HISTORY/FAMILY HISTORY   Reviewed in Epic:  Pertinent findings:  Social History   Tobacco Use   Smoking status: Former Smoker    Packs/day: 1.00    Years: 5.00    Pack years: 5.00    Types: Cigarettes   Smokeless tobacco: Never Used  Substance Use Topics   Alcohol use: Yes    Alcohol/week: 0.0 standard drinks    Comment: rarely   Drug use: Yes    Types: Marijuana    Comment: 10/18/15    Social History   Social History Narrative   He lives with his wife.   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    OBJCTIVE -PE, EKG, labs   Wt Readings from Last 3 Encounters:  01/27/21 (!) 324 lb (147 kg)  08/21/20 (!) 310 lb (140.6 kg)  05/21/20 (!) 300 lb 9.6 oz (136.4 kg)   Physical Exam: BP 132/88 (BP Location: Right Arm, Patient Position: Sitting, Cuff Size: Large)    Pulse 65    Ht 5\' 10"  (1.778 m)    Wt (!) 324 lb (147 kg)    BMI 46.49 kg/m  Physical Exam Constitutional:      General: He is not in acute distress.    Appearance: He is not ill-appearing or toxic-appearing.     Comments: Morbidly obese  HENT:     Head: Normocephalic and atraumatic.  Neck:     Vascular: No carotid bruit or JVD (Unable to assess).  Cardiovascular:     Rate and Rhythm: Normal rate and regular rhythm.  No extrasystoles are present.    Chest Wall: PMI is not displaced (Unable to assess).     Pulses: Decreased pulses (Trivial edema and obesity.  Unable to assess).     Heart sounds: S1 normal and S2 normal. Heart sounds are distant. No murmur heard. No gallop. No S4 sounds.   Pulmonary:     Effort: Pulmonary effort is normal. No respiratory distress.     Breath sounds: No wheezing, rhonchi or rales.     Comments: Distant breath sounds Musculoskeletal:        General: Swelling (I cannot tell about swelling or just obesity) present. Normal range of motion.     Cervical back: Normal range of motion and neck supple.  Neurological:     General: No focal deficit present.     Mental Status: He is alert and oriented to person, place, and time.     Motor: No weakness.     Gait: Gait normal.  Psychiatric:        Mood and Affect: Mood normal.     Comments: Poor insight.  Joking about weight gain 6 months after an MI.  Laughing about eating pies and cakes and so much "good food "     Adult ECG Report  Rate: 65 ;  Rhythm: normal sinus rhythm and Inferior MI, age  indeterminate; otherwise normal axis, intervals and durations.;   Narrative Interpretation: Stable  Recent Labs: Reviewed Lab Results  Component Value Date   CHOL 165 01/21/2021   HDL 28 (L) 01/21/2021   LDLCALC 85 01/21/2021   TRIG 315 (H) 01/21/2021   CHOLHDL 5.9 (H) 01/21/2021   Lab Results  Component Value Date   CREATININE 1.01 01/21/2021   BUN 13 01/21/2021   NA 142 01/21/2021   K 4.6 01/21/2021   CL 102 01/21/2021   CO2 23 01/21/2021  CBC Latest Ref Rng & Units 05/08/2020 05/07/2020 05/07/2020  WBC 4.0 - 10.5 K/uL 12.0(H) 11.9(H) 12.2(H)  Hemoglobin 13.0 - 17.0 g/dL 27.5 17.0 01.7  Hematocrit 39.0 - 52.0 % 46.7 48.4 50.0  Platelets 150 - 400 K/uL 223 237 221    Lab Results  Component Value Date   TSH 2.520 05/07/2020    ==================================================  COVID-19 Education: The signs and symptoms of COVID-19 were discussed with the patient and how to seek care for testing (follow up with PCP or arrange E-visit).   The importance of social distancing and COVID-19 vaccination was discussed today. The patient is practicing social distancing & Masking.   I spent a total of 44 minutes with the patient spent in direct patient consultation.  Additional time spent with chart review  / charting (studies, outside notes, etc): Total Time: 62 min  Current medicines are reviewed at length with the patient today.  (+/- concerns) N/A  This visit occurred during the SARS-CoV-2 public health emergency.  Safety protocols were in place, including screening questions prior to the visit, additional usage of staff PPE, and extensive cleaning of exam room while observing appropriate contact time as indicated for disinfecting solutions.  Notice: This dictation was prepared with Dragon dictation along with smaller phrase technology. Any transcriptional errors that result from this process are unintentional and may not be corrected upon review.  Patient  Instructions / Medication Changes & Studies & Tests Ordered   Patient Instructions  Medication Instructions:    start taking Zetia ( generic ) 10 mg one tablet daily   Increase losartan to 50 mg daily   ( you may take 2 ( 25 mg tablet to complete your current bottle of medications  *If you need a refill on your cardiac medications before your next appointment, please call your pharmacy*   Lab Work:  in May 2022 - fasting LIPID LIVER  HGBA1c  If you have labs (blood work) drawn today and your tests are completely normal, you will receive your results only by:  MyChart Message (if you have MyChart) OR  A paper copy in the mail If you have any lab test that is abnormal or we need to change your treatment, we will call you to review the results.   Testing/Procedures: Not needed   Follow-Up: At [facility], you and your health needs are our priority.  As part of our continuing mission to provide you with exceptional heart care, we have created designated Provider Care Teams.  These Care Teams include your primary Cardiologist (physician) and Advanced Practice Providers (APPs -  Physician Assistants and Nurse Practitioners) who all work together to provide you with the care you need, when you need it.     Your next appointment:   3 month(s) May 2022  The format for your next appointment:   In Person  Provider:   Bryan Lemma, MD   Other Instructions  Your physician encouraged you to lose weight for better health.    in you need to have urgent dental surgery - it would be okay to hold Brilinta for 5 days prior to dental surgery  But however with the least risk it would be better to delay as close to May 2022 as possible.    Studies Ordered:   Orders Placed This Encounter  Procedures   Hepatic function panel   Lipid panel   Hemoglobin A1c   EKG 12-Lead     Bryan Lemma, M.D., M.S. Interventional Cardiologist  Pager # [phone] Phone #  [phone] [address] [place], [place] [address]   Thank you for choosing Heart[facility] at [facility]!!

## 2021-02-01 MED FILL — ATORVASTATIN 80 MG TABLET: 80 | 30 days supply | Qty: 30 | Fill #2

## 2021-02-05 ENCOUNTER — Encounter: Payer: Self-pay | Admitting: Cardiology

## 2021-02-05 NOTE — Assessment & Plan Note (Signed)
He had multivessel disease, 2 vessels were occlusive, several areas of 40 to 60% stenosis and occluded distal circumflex.  Not really doing his part post MI.  Plan:   Add Zetia to his 80 mg atorvastatin  Strenuous counseling about diet, exercise absolute need for weight loss.  Increase losartan to 50 mg;   continue carvedilol 12.5 mg twice daily  Continue DAPT for now.

## 2021-02-05 NOTE — Assessment & Plan Note (Signed)
Decent sized inferior STEMI with RCA PCI followed by circumflex PCI.  He has moderate disease in several other places and essentially occluded distal circumflex. Despite this, he clearly has not gained understanding of self-control.  At the time of his MI, he lacked about having gained 100 pounds in a year, he has subsequently gained over 24 pounds after having an MI. I counseled him quite strenuously on this.  I indicated that it is quite frustrating.  Thankfully, his EF is back to normal.  No angina or heart failure symptoms.

## 2021-02-05 NOTE — Assessment & Plan Note (Signed)
Amazingly, on 80 mg of atorvastatin, his LDL went from 65 up to 85.  Triglycerides 315, indicative of dietary discretion.  I counseled him again on importance of dietary modification.  To this he giggled, and says that he will do better  Plan: Add Zetia 10 mg daily-recheck by May along with A1c

## 2021-02-05 NOTE — Assessment & Plan Note (Signed)
8 months post inferior STEMI with PCI to RCA and LCx-OM.  Plan:  Original plan was to consider stopping aspirin as of his May 2022 appointment  Continue uninterrupted Brilinta-90 mg twice daily until May 2022, at which time we will reduce to 60 mg twice daily maintenance dose for long-term therapy.  He asked about need for potentially having surgery.  If urgent surgery required, he is now 8 months out, and if absolutely necessary could hold Brilinta and aspirin 5 days preop for surgery procedure.  Otherwise, it would be best for him to wait until at least May at which time it would be safer to hold antiplatelet agents.   As of mid May 2022, plan is to DC aspirin and reduce Brilinta to 60 mg twice daily which can be interrupted (held 5 days preop for surgeries or procedures.).

## 2021-02-05 NOTE — Assessment & Plan Note (Signed)
He may have to have endodontic surgery.  Thankfully, he seems to doing fine as far as no active anginal symptoms or heart failure symptoms.  The surgery be relatively low risk although it may require general anesthesia.  With preserved EF on echo, he should be fine from a cardiac standpoint, but more concerning would be as respiratory status with his obesity.  I would prefer for him to wait as long as he can-is close to me as possible), to minimize risk of stopping antiplatelet agents.  If urgent surgical treatment is necessary for recurrent abscess (he is currently on antibiotics for endodontic abscess), he may require surgery, at which point he must understand the risks of stopping antiplatelet agents may potentially be outweighed by the risk of avoiding surgery.  In that case, holding antiplatelet agents for 5 days would be reasonable.

## 2021-02-05 NOTE — Assessment & Plan Note (Addendum)
He is now gained 24 pounds since his MI.  14 pounds since last visit.  Clearly, the counseling done at last visit did not work.  I gave him some credit for smoking cessation being a potential reason for additional weight gain, but now that is no longer an issue.  He has multiple excuses/reasons for having put on weight, but has no plans for how we will lose weight.  His plan is to get back into the [facility] and start to exercise.  He needs also monitor his diet.  If he is unable to to control himself, my plan will be to refer him for nutrition counseling at the [facility].

## 2021-02-08 MED FILL — CARVEDILOL 12.5 MG TABLET: 12.5 | 90 days supply | Qty: 180 | Fill #0

## 2021-02-08 MED FILL — LOSARTAN POTASSIUM 25 MG TA: 25 | 30 days supply | Qty: 30 | Fill #3

## 2021-02-11 MED FILL — LOSARTAN POTASSIUM 50 MG TA: 50 | 90 days supply | Qty: 90 | Fill #0

## 2021-03-03 MED FILL — ATORVASTATIN 80 MG TABLET: 80 | 30 days supply | Qty: 30 | Fill #3

## 2021-03-16 ENCOUNTER — Other Ambulatory Visit (HOSPITAL_BASED_OUTPATIENT_CLINIC_OR_DEPARTMENT_OTHER): Payer: Self-pay

## 2021-03-16 MED FILL — BRILINTA 90 MG TABLET: 90 | 90 days supply | Qty: 180 | Fill #2

## 2021-03-31 ENCOUNTER — Other Ambulatory Visit (HOSPITAL_COMMUNITY): Payer: Self-pay

## 2021-03-31 MED FILL — Atorvastatin Calcium Tab 80 MG (Base Equivalent): ORAL | 30 days supply | Qty: 30 | Fill #0 | Status: AC

## 2021-04-21 ENCOUNTER — Other Ambulatory Visit (HOSPITAL_COMMUNITY): Payer: Self-pay

## 2021-04-21 MED FILL — Ezetimibe Tab 10 MG: ORAL | 90 days supply | Qty: 90 | Fill #0 | Status: AC

## 2021-04-27 ENCOUNTER — Other Ambulatory Visit (HOSPITAL_COMMUNITY): Payer: Self-pay

## 2021-04-27 ENCOUNTER — Other Ambulatory Visit: Payer: Self-pay | Admitting: Cardiology

## 2021-04-27 MED ORDER — ATORVASTATIN CALCIUM 80 MG PO TABS
ORAL_TABLET | Freq: Every day | ORAL | 3 refills | Status: DC
  Filled 2021-04-27 – 2021-04-30 (×2): qty 90, 90d supply, fill #0
  Filled 2021-07-27: qty 90, 90d supply, fill #1
  Filled 2021-10-28: qty 90, 90d supply, fill #2
  Filled 2022-01-26: qty 90, 90d supply, fill #3

## 2021-04-27 NOTE — Telephone Encounter (Signed)
Rx(s) sent to pharmacy electronically.  

## 2021-04-28 ENCOUNTER — Ambulatory Visit: Payer: 59 | Admitting: Cardiology

## 2021-04-30 ENCOUNTER — Other Ambulatory Visit (HOSPITAL_COMMUNITY): Payer: Self-pay

## 2021-05-07 MED FILL — Carvedilol Tab 12.5 MG: ORAL | 90 days supply | Qty: 180 | Fill #0 | Status: AC

## 2021-05-08 ENCOUNTER — Other Ambulatory Visit (HOSPITAL_COMMUNITY): Payer: Self-pay

## 2021-05-17 NOTE — Progress Notes (Signed)
Cardiology Office Note   Date:  05/26/2021   ID:  Gordon Richards, DOB 1961-02-04, MRN [id number]  PCP:  Merryl Hacker, No  Cardiologist:  Glenetta Hew, MD  EP: None  Chief Complaint  Patient presents with  . Follow-up    CAD, CHF      History of Present Illness: Gordon Richards is a 60 y.o. male with a PMH of CAD s/p recent STEMI 05/05/20 with PCI/DES to RCA and OM1, chronic combined CHF, ischemic cardiomyopathy, HTN, HLD, and tobacco abuse who presents for routine follow-up.  He was last evaluated by cardiology at an outpatient visit with Dr. Ellyn Hack 01/2021 at which time he continued to struggle with maintaining regular exercise and weight gain (up 24lbs from MI 04/2020). He had not completed his sleep study as of that visit. He was recommended to continue DAPT for a minimum of 6 months following MI 04/2020, then could stop aspirin to allow use of prn NSAIDs for managing his arthritis, with plans to continue brilinta through 04/2021, then reduce dose to maintenance of 60mg  BID. His losartan was increased to 50mg  daily and zetia was added given increase in LDL from 65>85 (likely 2/2 dietary indiscretion). He was cleared to hold DAPT in anticipation of possible endodontic procedure if emergent, otherwise preferred to wait until May 2022 if possible.  He was recommended to follow-up in 3 months.  His last ischemic evaluation was at the time of his MI 05/05/21 which revealed severe 3 vessel CAD with 60% pRCA stenosis, 95% mRCA thrombotic subtotal occlusion (culprit lesion #1), 95% OM1 stenosis (culprit lesion #2), LPAV with 65% stenosis and 99% downstream occlusion unable to cross with wire so likely CTO, and 40% LAD stenosis. He underwent successful PCI/DES to RCA and OM1. Ecoh showed EF 45-50%, indeterminate LV diastolic function, moderate hypokinesis of the mid-apical inferior and lateral wall, and no significant valvular abnormalities. Repeat echo 07/2020 showed EF 65-70%, G2DD, no RMWA, and no significant  valvular abnormalities.   He presents with his wife for routine follow-up. He reports he is finally in the right mind-set to take his weight loss seriously. He is 6 days into his "Driveway diet" which he tells me is named due to him utilizing his inclined driveway to kick start a diet ~4 years ago which resulted in him losing >100lbs. He has altered his diet over the past week and states he has lost 18lbs (was up to 350lbs last week). His diet consists of low carb, low sugar diet, though is lacking in the fruits/vegetables department. I offered a referral to the [facility], however he wanted to try his way for 3 months and if no significant results would be open to meeting with them. He has had some LE edema recently which has improved since he knuckled down on his diet and eliminated sodas. Wife asks about a fluid pill should his swelling reoccur - would be reasonable to use prn lasix. They have not been monitoring his BP at home but will plan to do so going forward. We discussed goal BP of <130/80. He denies chest pain, SOB, palpitations, or PND. He does have orthopnea and snoring and has yet to complete his sleep study. He is again open to being tested for sleep apnea. Many of his issues are directly related to his weight gain. He finally seems motivated to get the weight off after a year of continuous weight gain.     Past Medical History:  Diagnosis  Date  . Acute ST elevation myocardial infarction (STEMI) of inferior wall ([place]) 05/07/2020   Likely culprit lesion was 95% thrombotic mRCA (in tandem with pRCA 60%) - Lesion #1 DES PCI; OM1 also had 95% lesion (porential culprit - Lesion #2 DES PCI); ~CTO of AVG Cx after OM1 (unable to cross). EF 45-50% - Mod LVEDP with severe systemic HTN.   . CAD S/P DES PCI - pRCA, ostOM1. CTO of AVG Cx 05/08/2020    pRCA 60%& mRCA 95% (Resolute Onyx DES 4.0 mm x 34 mm -> postdilated from 5.2-4.6 mm), RPAV 40% -RPL 2; OM1 95% (Resolute Onyx DES  2.75 mm x15 mm-2.95 mm), OM1 60%, small caliber LP AV 65% and 99% granulated with no loss (likely CTO); p-mLAD 40%, mLAD 40%.  . Hyperlipidemia due to dietary fat intake 05/08/2020  . Morbid obesity ([place]) 05/08/2020    Past Surgical History:  Procedure Laterality Date  . COLONOSCOPY WITH PROPOFOL N/A 10/26/2015   Procedure: COLONOSCOPY WITH PROPOFOL;  Surgeon: [name], MD;  Location: [facility];  Service: Endoscopy;  Laterality: N/A;  [place] time in  0748  time out  0758  total time 10 minutes  . CORONARY/GRAFT ACUTE MI REVASCULARIZATION N/A 05/07/2020   Procedure: Coronary/Graft Acute MI Revascularization;  Surgeon: Leonie Man, MD;  Location: [facility];; INF STEMI: Lesions #1: pRCA 60%& mRCA 95% (covering both: Resolute Onyx DES 4.0 mm x 34 mm -> post-dil from 5.2-4.6 mm); Lesion #2: OM1 95% (Resolute Onyx DES 2.75 mm x15 mm-2.95 mm),  . ESOPHAGOGASTRODUODENOSCOPY N/A 10/09/2015   Procedure: ESOPHAGOGASTRODUODENOSCOPY (EGD);  Surgeon: [name], MD;  Location: [facility];  Service: Endoscopy;  Laterality: N/A;  . FOOT SURGERY Right    bone cutting  . HEMORROIDECTOMY  2003  . LEFT HEART CATH AND CORONARY ANGIOGRAPHY N/A 05/07/2020   Procedure: LEFT HEART CATH AND CORONARY ANGIOGRAPHY;  Surgeon: Leonie Man, MD;  Location: [facility];;  pRCA 60%& mRCA 95% (Lesion #1 DES PCI), RPAV 40% -RPL 2; OM1 95% (Lesion #2 DES PCI), OM1 60%; small caliber LP AV 65% and 99% unable to cannulate (likely CTO); p-mLAD 40%, mLAD 40%. EF 45-50%. Mod elevated LVEDP  . LIPOMA EXCISION Right 11/08/2013   Procedure: EXCISION NEOPLASM RIGHT ARM;  Surgeon: [name], MD;  Location: [facility];  Service: General;  Laterality: Right;  . MASS EXCISION Right 11/08/2013   Procedure: EXCISION NEOPLASM SCROTUM;  Surgeon: [name], MD;  Location: [facility];  Service: General;  Laterality: Right;  . TRANSTHORACIC ECHOCARDIOGRAM  05/07/2020   In setting of inferior STEMI: EF 45-50%.   Moderate HK of the mid-apical inferior and lateral wall.  Otherwise normal.  . TRANSTHORACIC ECHOCARDIOGRAM  08/21/2020   EF 65-70%. Gr 2 DD. No RWMA. Mild Asc Ao dilation - 39 mm. Normal valves. Normal RV. Normal Atriae.     Current Outpatient Medications  Medication Sig Dispense Refill  . amoxicillin (AMOXIL) 500 MG capsule Take by mouth.    Marland Kitchen atorvastatin (LIPITOR) 80 MG tablet TAKE 1 TABLET BY MOUTH ONCE A DAY 90 tablet 3  . carvedilol (COREG) 12.5 MG tablet TAKE 1 TABLET BY MOUTH 2 TIMES DAILY WITH MEALS 180 tablet 3  . Cholecalciferol (VITAMIN D3) 25 MCG (1000 UT) CAPS Take by mouth.    . ezetimibe (ZETIA) 10 MG tablet TAKE 1 TABLET (10 MG TOTAL) BY MOUTH DAILY. 90 tablet 3  . furosemide (LASIX) 40 MG tablet Take 1 tablet (40  mg total) by mouth daily as needed. For swelling 30 tablet 3  . losartan (COZAAR) 50 MG tablet TAKE 1 TABLET BY MOUTH DAILY. 90 tablet 3  . nitroGLYCERIN (NITROSTAT) 0.4 MG SL tablet Place 1 tablet (0.4 mg total) under the tongue every 5 (five) minutes x 3 doses as needed for chest pain. 25 tablet 0  . ticagrelor (BRILINTA) 60 MG TABS tablet Take 1 tablet (60 mg total) by mouth 2 (two) times daily. 180 tablet 3  . vitamin C (ASCORBIC ACID) 500 MG tablet Take 2,000 mg by mouth daily.      No current facility-administered medications for this visit.    Allergies:   Patient has no known allergies.    Social History:  The patient  reports that he has quit smoking. His smoking use included cigarettes. He has a 5.00 pack-year smoking history. He has never used smokeless tobacco. He reports current alcohol use. He reports current drug use. Drug: Marijuana.   Family History:  The patient's family history includes Healthy in his mother; Heart attack in his maternal uncle; Heart attack (age of onset: 84) in his paternal uncle; Heart attack (age of onset: 36) in his maternal grandfather; Parkinson's disease (age of onset: 10) in his paternal grandfather.    ROS:  Please  see the history of present illness.   Otherwise, review of systems are positive for none.   All other systems are reviewed and negative.    PHYSICAL EXAM: VS:  BP (!) 142/88   Pulse 70   Ht 5\' 10"  (1.778 m)   Wt (!) 332 lb (150.6 kg)   SpO2 95%   BMI 47.64 kg/m  , BMI Body mass index is 47.64 kg/m. GEN: Morbidly obese, in no acute distress HEENT: sclera anitcteric Neck: no JVD, carotid bruits, or masses Cardiac: RRR; no murmurs, rubs, or gallops, trace LE edema  Respiratory:  clear to auscultation bilaterally, normal work of breathing GI: soft, nontender, nondistended, + BS MS: no deformity or atrophy Skin: warm and dry, no rash Neuro:  Strength and sensation are intact Psych: euthymic mood, full affect   EKG:  EKG is not ordered today.    Recent Labs: 01/21/2021: ALT 112; BUN 13; Creatinine, Ser 1.01; Potassium 4.6; Sodium 142    Lipid Panel    Component Value Date/Time   CHOL 107 05/25/2021 0831   TRIG 132 05/25/2021 0831   HDL 30 (L) 05/25/2021 0831   CHOLHDL 3.6 05/25/2021 0831   CHOLHDL 6.2 05/07/2020 0821   VLDL 22 05/07/2020 0821   LDLCALC 53 05/25/2021 0831      Wt Readings from Last 3 Encounters:  05/26/21 (!) 332 lb (150.6 kg)  01/27/21 (!) 324 lb (147 kg)  08/21/20 (!) 310 lb (140.6 kg)      Other studies Reviewed: Additional studies/ records that were reviewed today include:   LHC 05/07/20:   There is no aortic valve stenosis.  CULPRIT LESION #1 prox RCA-1 lesion is 60% stenosed. Prox RCA-2 lesion is 95% stenosed.  A drug-eluting stent was successfully placed covering both lesions using a STENT RESOLUTE ONYX 4.0X34. Postdilated and taper fashion from 5.2-4.6 mm  Post intervention, there is a 0% residual stenosis.  RPAV lesion is 40% stenosed with 40% stenosed side branch in 2nd RPL.  ---  CULPRIT LESION #2 1st Mrg lesion is 95% stenosed.  A drug-eluting stent was successfully placed using a STENT RESOLUTE ONYX 2.75 X 15 -> postdilated  to 2.95 mm  Post intervention,  there is a 0% residual stenosis.  --  Lat 1st Mrg lesion is 60% stenosed.  LPAV-1 lesion is 65% stenosed.  LPAV-2 lesion is 99% stenosed. -> Unable to cross with wire, likely currently occluded  ---  Prox LAD to Mid LAD lesion is 40% stenosed.  Mid LAD lesion is 40% stenosed.  ---  There is mild left ventricular systolic dysfunction. The left ventricular ejection fraction is 45-50% by visual estimate. LV end diastolic pressure is moderately elevated.  Prox RCA-2 lesion is 95% stenosed.  SUMMARY  SEVERE THREE-VESSEL DISEASE with proximal RCA 60% followed by mid RCA 95% thrombotic subtotal occlusion (culprit lesion), 99% thrombotic stenosis of large bifurcating OM1 and 70 to 100% likely CTO occlusion of AV groove circumflex (unable to cross). ? Successful DES PCI of proximal to mid RCA using Resolute Onyx 4.0 mm x 34 mm postdilated to 5.2 mm proximal and 4.6 mm distal ? Successful DES PCI of ostial OM1 with Resolute Onyx DES 2.75 mm x15 mm postdilated to 2.95 mm  Poor visualization on left ventriculogram. Would recommend echocardiogram. Could not evaluate wall motion, however EF did not appear to be decreased.  Moderately elevated LVEDP.  Severe systemic hypertension.   RECOMMENDATIONS  Admit to CCU for ongoing care, or on Aggrastat until current bottle complete.  Aggressive risk factor modification-we will check lipid panel start atorvastatin 80 mg daily  Start carvedilol titrate accordingly blood pressure.. Medications ordered.  Smoking cessation counseling  2D echo ordered  With two-vessel PCI large territory involved, likely not a candidate for fast-track discharge.   Echocardiogram 05/07/20:  1. Left ventricular ejection fraction, by estimation, is 45 to 50%. The  left ventricle has mildly decreased function. The left ventricle  demonstrates regional wall motion abnormalities (see scoring  diagram/findings for  description). Left ventricular  diastolic parameters are indeterminate. There is moderate hypokinesis of  the left ventricular, mid-apical inferior wall and lateral wall.  2. Right ventricular systolic function is normal. The right ventricular  size is normal.  3. The mitral valve is normal in structure. No evidence of mitral valve  regurgitation. No evidence of mitral stenosis.  4. The aortic valve is tricuspid. Aortic valve regurgitation is not  visualized. No aortic stenosis is present.  5. The inferior vena cava is normal in size with <50% respiratory  variability, suggesting right atrial pressure of 8 mmHg.   Echocardiogram 07/2020: 1. Left ventricular ejection fraction, by estimation, is 65 to 70%. The  left ventricle has normal function. The left ventricle has no regional  wall motion abnormalities. Left ventricular diastolic parameters are  consistent with Grade II diastolic  dysfunction (pseudonormalization).  2. Right ventricular systolic function is normal. The right ventricular  size is normal. There is normal pulmonary artery systolic pressure.  3. The mitral valve is normal in structure. No evidence of mitral valve  regurgitation. No evidence of mitral stenosis.  4. The aortic valve is tricuspid. Aortic valve regurgitation is not  visualized. No aortic stenosis is present.  5. Aortic dilatation noted. There is mild dilatation at the level of the  sinuses of Valsalva measuring 39 mm.  6. The inferior vena cava is normal in size with greater than 50%  respiratory variability, suggesting right atrial pressure of 3 mmHg.    ASSESSMENT AND PLAN:  1. CAD s/p PCI/DES to RCA and OM1 05/05/20: no recurrent chest pain. He is now 1 year out from his MI.  - Will stop aspirin - Will reduce brilinta to maintenance  dose 60 mg BID - Continue statin and zetia - Continue carvedilol  2. Chronic combined CHF/ischemic cardiomyopathy: EF 45-50% on echo at the time of his STEMI  with improvement in EF to 65-70% 07/2020. Had some recent LE edema, likely diet related which has improved with cutting down on salt intake.  - Will give rx for lasix 40mg  daily as needed for LE edema or weight gain. Pt to notify office if taking more than 2x per week so we can monitor labs. - Continue carvedilol and losartan  3. HTN: BP 142/88 today. He is hesitant to increase medications at this time. They will monitor BP at home over the next couple weeks and notify the office if BP >130/80, at which time an increase in carvedilol could be considered - Continue carvedilol and losartan  4. HLD: LDL 85 12/2020. Started on zetia 01/2021, in addition to atorvastatin. Repeat FLP yesterday showed improvement in LDL to 53 and triglycerides down from 315>135 - Continue atorvastatin and zetia  5. Tobacco abuse: he has not smoked since his MI  - Continue to encourage smoking abstinence   6. Suspected OSA: he is morbidly obese with snoring and insomnia. Ordered for a sleep study 1 year ago but has yet to complete this.  - Will re-order a sleep study to evaluate for suspected OSA  7. Morbid obesity: finally seems motivated to lose weight. Offered referral to [facility] but he would like to try his own method for a few months - Will see him back in 3 months and if weight is not significantly lower, will place referral at that time.    Current medicines are reviewed at length with the patient today.  The patient does not have concerns regarding medicines.  The following changes have been made:  As above  Labs/ tests ordered today include:   Orders Placed This Encounter  Procedures  . Split night study     Disposition:   FU with me in 3 months  Signed, Abigail Butts, PA-C  05/26/2021 9:36 AM

## 2021-05-19 MED FILL — Losartan Potassium Tab 50 MG: ORAL | 90 days supply | Qty: 90 | Fill #0 | Status: AC

## 2021-05-20 ENCOUNTER — Other Ambulatory Visit (HOSPITAL_COMMUNITY): Payer: Self-pay

## 2021-05-25 DIAGNOSIS — I2119 ST elevation (STEMI) myocardial infarction involving other coronary artery of inferior wall: Secondary | ICD-10-CM | POA: Diagnosis not present

## 2021-05-25 DIAGNOSIS — E7849 Other hyperlipidemia: Secondary | ICD-10-CM | POA: Diagnosis not present

## 2021-05-25 DIAGNOSIS — Z9861 Coronary angioplasty status: Secondary | ICD-10-CM | POA: Diagnosis not present

## 2021-05-25 DIAGNOSIS — I25110 Atherosclerotic heart disease of native coronary artery with unstable angina pectoris: Secondary | ICD-10-CM | POA: Diagnosis not present

## 2021-05-25 DIAGNOSIS — I2510 Atherosclerotic heart disease of native coronary artery without angina pectoris: Secondary | ICD-10-CM | POA: Diagnosis not present

## 2021-05-26 ENCOUNTER — Ambulatory Visit (INDEPENDENT_AMBULATORY_CARE_PROVIDER_SITE_OTHER): Payer: 59 | Admitting: Medical

## 2021-05-26 ENCOUNTER — Encounter: Payer: Self-pay | Admitting: Medical

## 2021-05-26 ENCOUNTER — Other Ambulatory Visit (HOSPITAL_COMMUNITY): Payer: Self-pay

## 2021-05-26 ENCOUNTER — Other Ambulatory Visit: Payer: Self-pay

## 2021-05-26 VITALS — BP 142/88 | HR 70 | Ht 70.0 in | Wt 332.0 lb

## 2021-05-26 DIAGNOSIS — E785 Hyperlipidemia, unspecified: Secondary | ICD-10-CM

## 2021-05-26 DIAGNOSIS — R29818 Other symptoms and signs involving the nervous system: Secondary | ICD-10-CM

## 2021-05-26 DIAGNOSIS — I2510 Atherosclerotic heart disease of native coronary artery without angina pectoris: Secondary | ICD-10-CM | POA: Diagnosis not present

## 2021-05-26 DIAGNOSIS — R0683 Snoring: Secondary | ICD-10-CM | POA: Diagnosis not present

## 2021-05-26 DIAGNOSIS — I10 Essential (primary) hypertension: Secondary | ICD-10-CM | POA: Diagnosis not present

## 2021-05-26 DIAGNOSIS — I5042 Chronic combined systolic (congestive) and diastolic (congestive) heart failure: Secondary | ICD-10-CM | POA: Diagnosis not present

## 2021-05-26 LAB — LIPID PANEL
Chol/HDL Ratio: 3.6 ratio (ref 0.0–5.0)
Cholesterol, Total: 107 mg/dL (ref 100–199)
HDL: 30 mg/dL — ABNORMAL LOW (ref >39)
LDL Chol Calc (NIH): 53 mg/dL (ref 0–99)
Triglycerides: 132 mg/dL (ref 0–149)
VLDL Cholesterol Cal: 24 mg/dL (ref 5–40)

## 2021-05-26 LAB — HEMOGLOBIN A1C
Est. average glucose Bld gHb Est-mCnc: 128 mg/dL
Hgb A1c MFr Bld: 6.1 % — ABNORMAL HIGH (ref 4.8–5.6)

## 2021-05-26 MED ORDER — FUROSEMIDE 40 MG PO TABS
40.0000 mg | ORAL_TABLET | Freq: Every day | ORAL | 3 refills | Status: DC | PRN
  Filled 2021-05-26: qty 30, 30d supply, fill #0
  Filled 2021-09-06: qty 30, 30d supply, fill #1

## 2021-05-26 MED ORDER — TICAGRELOR 60 MG PO TABS
60.0000 mg | ORAL_TABLET | Freq: Two times a day (BID) | ORAL | 3 refills | Status: DC
  Filled 2021-05-26: qty 180, 90d supply, fill #0
  Filled 2021-09-18: qty 60, 30d supply, fill #1
  Filled 2021-10-15: qty 180, 90d supply, fill #2
  Filled 2022-01-09: qty 180, 90d supply, fill #3
  Filled 2022-04-13: qty 60, 30d supply, fill #4
  Filled 2022-05-09: qty 60, 30d supply, fill #5

## 2021-05-26 NOTE — Patient Instructions (Signed)
Medication Instructions:  STOP- Aspirin DECREASE- Brilinta 60 mg by mouth daily START- Furosemide 40 mg by mouth as needed for swelling  *If you need a refill on your cardiac medications before your next appointment, please call your pharmacy*   Lab Work: None Ordered   Testing/Procedures: Your physician has recommended that you have a sleep study. This test records several body functions during sleep, including: brain activity, eye movement, oxygen and carbon dioxide blood levels, heart rate and rhythm, breathing rate and rhythm, the flow of air through your mouth and nose, snoring, body muscle movements, and chest and belly movement.   Follow-Up: At [facility], you and your health needs are our priority.  As part of our continuing mission to provide you with exceptional heart care, we have created designated Provider Care Teams.  These Care Teams include your primary Cardiologist (physician) and Advanced Practice Providers (APPs -  Physician Assistants and Nurse Practitioners) who all work together to provide you with the care you need, when you need it.  We recommend signing up for the patient portal called "MyChart".  Sign up information is provided on this After Visit Summary.  MyChart is used to connect with patients for Virtual Visits (Telemedicine).  Patients are able to view lab/test results, encounter notes, upcoming appointments, etc.  Non-urgent messages can be sent to your provider as well.   To learn more about what you can do with MyChart, go to [email or web address].    Your next appointment:   3 month(s)  The format for your next appointment:   In Person  Provider:   You will see one of the following Advanced Practice Providers on your designated Care Team:     Roby Lofts, PA-C   Other Instructions  Heart-Healthy Eating Plan Heart-healthy meal planning includes:  Eating less unhealthy fats.  Eating more healthy fats.  Making other changes in your  diet. Talk with your doctor or a diet specialist (dietitian) to create an eating plan that is right for you. What is my plan? Your doctor may recommend an eating plan that includes:  Total fat: ______% or less of total calories a day.  Saturated fat: ______% or less of total calories a day.  Cholesterol: less than _________mg a day. What are tips for following this plan? Cooking Avoid frying your food. Try to bake, boil, grill, or broil it instead. You can also reduce fat by:  Removing the skin from poultry.  Removing all visible fats from meats.  Steaming vegetables in water or broth. Meal planning  At meals, divide your plate into four equal parts: ? Fill one-half of your plate with vegetables and green salads. ? Fill one-fourth of your plate with whole grains. ? Fill one-fourth of your plate with lean protein foods.  Eat 4-5 servings of vegetables per day. A serving of vegetables is: ? 1 cup of raw or cooked vegetables. ? 2 cups of raw leafy greens.  Eat 4-5 servings of fruit per day. A serving of fruit is: ? 1 medium whole fruit. ?  cup of dried fruit. ?  cup of fresh, frozen, or canned fruit. ?  cup of 100% fruit juice.  Eat more foods that have soluble fiber. These are apples, broccoli, carrots, beans, peas, and barley. Try to get 20-30 g of fiber per day.  Eat 4-5 servings of nuts, legumes, and seeds per week: ? 1 serving of dried beans or legumes equals  cup after being cooked. ? 1 serving of nuts  is  cup. ? 1 serving of seeds equals 1 tablespoon.   General information  Eat more home-cooked food. Eat less restaurant, buffet, and fast food.  Limit or avoid alcohol.  Limit foods that are high in starch and sugar.  Avoid fried foods.  Lose weight if you are overweight.  Keep track of how much salt (sodium) you eat. This is important if you have high blood pressure. Ask your doctor to tell you more about this.  Try to add vegetarian meals each  week. Fats  Choose healthy fats. These include olive oil and canola oil, flaxseeds, walnuts, almonds, and seeds.  Eat more omega-3 fats. These include salmon, mackerel, sardines, tuna, flaxseed oil, and ground flaxseeds. Try to eat fish at least 2 times each week.  Check food labels. Avoid foods with trans fats or high amounts of saturated fat.  Limit saturated fats. ? These are often found in animal products, such as meats, butter, and cream. ? These are also found in plant foods, such as palm oil, palm kernel oil, and coconut oil.  Avoid foods with partially hydrogenated oils in them. These have trans fats. Examples are stick margarine, some tub margarines, cookies, crackers, and other baked goods. What foods can I eat? Fruits All fresh, canned (in natural juice), or frozen fruits. Vegetables Fresh or frozen vegetables (raw, steamed, roasted, or grilled). Green salads. Grains Most grains. Choose whole wheat and whole grains most of the time. Rice and pasta, including brown rice and pastas made with whole wheat. Meats and other proteins Lean, well-trimmed beef, veal, pork, and lamb. Chicken and [place] without skin. All fish and shellfish. Wild duck, rabbit, pheasant, and venison. Egg whites or low-cholesterol egg substitutes. Dried beans, peas, lentils, and tofu. Seeds and most nuts. Dairy Low-fat or nonfat cheeses, including ricotta and mozzarella. Skim or 1% milk that is liquid, powdered, or evaporated. Buttermilk that is made with low-fat milk. Nonfat or low-fat yogurt. Fats and oils Non-hydrogenated (trans-free) margarines. Vegetable oils, including soybean, sesame, sunflower, olive, peanut, safflower, corn, canola, and cottonseed. Salad dressings or mayonnaise made with a vegetable oil. Beverages Mineral water. Coffee and tea. Diet carbonated beverages. Sweets and desserts Sherbet, gelatin, and fruit ice. Small amounts of dark chocolate. Limit all sweets and desserts. Seasonings  and condiments All seasonings and condiments. The items listed above may not be a complete list of foods and drinks you can eat. Contact a dietitian for more options. What foods should I avoid? Fruits Canned fruit in heavy syrup. Fruit in cream or butter sauce. Fried fruit. Limit coconut. Vegetables Vegetables cooked in cheese, cream, or butter sauce. Fried vegetables. Grains Breads that are made with saturated or trans fats, oils, or whole milk. Croissants. Sweet rolls. Donuts. High-fat crackers, such as cheese crackers. Meats and other proteins Fatty meats, such as hot dogs, ribs, sausage, bacon, rib-eye roast or steak. High-fat deli meats, such as salami and bologna. Caviar. Domestic duck and goose. Organ meats, such as liver. Dairy Cream, sour cream, cream cheese, and creamed cottage cheese. Whole-milk cheeses. Whole or 2% milk that is liquid, evaporated, or condensed. Whole buttermilk. Cream sauce or high-fat cheese sauce. Yogurt that is made from whole milk. Fats and oils Meat fat, or shortening. Cocoa butter, hydrogenated oils, palm oil, coconut oil, palm kernel oil. Solid fats and shortenings, including bacon fat, salt pork, lard, and butter. Nondairy cream substitutes. Salad dressings with cheese or sour cream. Beverages Regular sodas and juice drinks with added sugar. Sweets and desserts  Frosting. Pudding. Cookies. Cakes. Pies. Milk chocolate or white chocolate. Buttered syrups. Full-fat ice cream or ice cream drinks. The items listed above may not be a complete list of foods and drinks to avoid. Contact a dietitian for more information. Summary  Heart-healthy meal planning includes eating less unhealthy fats, eating more healthy fats, and making other changes in your diet.  Eat a balanced diet. This includes fruits and vegetables, low-fat or nonfat dairy, lean protein, nuts and legumes, whole grains, and heart-healthy oils and fats. This information is not intended to replace  advice given to you by your health care provider. Make sure you discuss any questions you have with your health care provider. Document Revised: 02/15/2018 Document Reviewed: 01/19/2018 Elsevier Patient Education  2021 [employer].

## 2021-05-27 ENCOUNTER — Other Ambulatory Visit (HOSPITAL_COMMUNITY): Payer: Self-pay

## 2021-05-28 ENCOUNTER — Other Ambulatory Visit (HOSPITAL_COMMUNITY): Payer: Self-pay

## 2021-07-19 ENCOUNTER — Other Ambulatory Visit (HOSPITAL_COMMUNITY): Payer: Self-pay

## 2021-07-19 MED FILL — Ezetimibe Tab 10 MG: ORAL | 90 days supply | Qty: 90 | Fill #1 | Status: AC

## 2021-07-27 ENCOUNTER — Other Ambulatory Visit (HOSPITAL_COMMUNITY): Payer: Self-pay

## 2021-08-08 MED FILL — Carvedilol Tab 12.5 MG: ORAL | 90 days supply | Qty: 180 | Fill #1 | Status: AC

## 2021-08-09 ENCOUNTER — Other Ambulatory Visit (HOSPITAL_COMMUNITY): Payer: Self-pay

## 2021-08-15 MED FILL — Losartan Potassium Tab 50 MG: ORAL | 90 days supply | Qty: 90 | Fill #1 | Status: AC

## 2021-08-16 ENCOUNTER — Other Ambulatory Visit (HOSPITAL_COMMUNITY): Payer: Self-pay

## 2021-08-31 NOTE — Progress Notes (Signed)
Cardiology Office Note   Date:  09/01/2021   ID:  Gordon Richards, DOB [birth year], MRN [id number]  PCP:  Merryl Hacker, No  Cardiologist:  Glenetta Hew, MD EP: None  Chief Complaint  Patient presents with   Follow-up    CAD, CHF, obesity       History of Present Illness: Gordon Richards is a 60 y.o. male with a PMH of CAD s/p recent STEMI 05/05/20 with PCI/DES to RCA and OM1, chronic combined CHF, ischemic cardiomyopathy, HTN, HLD, morbid obesity, and tobacco abuse  who presents for 3 month follow-up.  He was last evaluated by cardiology at an outpatient visit with myself 05/2021 at which time he was motivated to take weight loss more seriously. He planned to use his "Driveway diet" which involved him walking up and down his inclined driveway. He was making dietary modifications as well to support his weight loss, though was not interested in a referral to the [facility] at the time of our visit. He was without cardiac complaints at that visit. His aspirin was discontinued at that time given he was 1 year out from MI and brilinta reduced to maintenance dosing '60mg'$  BID, and lasix was transitioned to PRN. He was recommended to obtain a sleep study which has not been completed yet and to follow-up in 3 months.   His last ischemic evaluation was at the time of his MI 05/05/21 which revealed severe 3 vessel CAD with 60% pRCA stenosis, 95% mRCA thrombotic subtotal occlusion (culprit lesion #1), 95% OM1 stenosis (culprit lesion #2), LPAV with 65% stenosis and 99% downstream occlusion unable to cross with wire so likely CTO, and 40% LAD stenosis. He underwent successful PCI/DES to RCA and OM1. Ecoh showed EF 45-50%, indeterminate LV diastolic function, moderate hypokinesis of the mid-apical inferior and lateral wall, and no significant valvular abnormalities. Repeat echo 07/2020 showed EF 65-70%, G2DD, no RMWA, and no significant valvular abnormalities.  He presents today with his wife.  He has lost ~22lbs since his last visit. He is trying to maintain a regular exercise regimen and limit sugars in his diet. He has been experiencing more hip pain recently and we discussed seeing an orthopedic provider to evaluate for arthritis. We discussed medication options for pain control, encouraging tylenol use and avoidance of NSAIDs as he remains on brilinta. 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 He has not had any chest pain, SOB, DOE, orthopnea, PND, palpitations, or LE edema. He is looking forward to his granddaughters senior year and watching her play tennis (he tells me she is ranked 16th in the state).     Past Medical History:  Diagnosis Date   Acute ST elevation myocardial infarction (STEMI) of inferior wall ([place]) 05/07/2020   Likely culprit lesion was 95% thrombotic mRCA (in tandem with pRCA 60%) - Lesion #1 DES PCI; OM1 also had 95% lesion (porential culprit - Lesion #2 DES PCI); ~CTO of AVG Cx after OM1 (unable to cross). EF 45-50% - Mod LVEDP with severe systemic HTN.    CAD S/P DES PCI - pRCA, ostOM1. CTO of AVG Cx 05/08/2020    pRCA 60%& mRCA 95% (Resolute Onyx DES 4.0 mm x 34 mm -> postdilated from 5.2-4.6 mm), RPAV 40% -RPL 2; OM1 95% (Resolute Onyx DES 2.75 mm x15 mm-2.95 mm), OM1 60%, small caliber LP AV 65% and 99% granulated with no loss (likely CTO); p-mLAD 40%, mLAD 40%.  Hyperlipidemia due to dietary fat intake 05/08/2020   Morbid obesity ([place]) 05/08/2020    Past Surgical History:  Procedure Laterality Date   COLONOSCOPY WITH PROPOFOL N/A 10/26/2015   Procedure: COLONOSCOPY WITH PROPOFOL;  Surgeon: [name], MD;  Location: [facility];  Service: Endoscopy;  Laterality: N/A;  [place] time in  0748  time out  0758  total time 10 minutes   CORONARY/GRAFT ACUTE MI REVASCULARIZATION N/A 05/07/2020   Procedure: Coronary/Graft Acute MI Revascularization;  Surgeon: Leonie Man, MD;  Location: [place] [facility];; INF STEMI:  Lesions #1: pRCA 60%& mRCA 95% (covering both: Resolute Onyx DES 4.0 mm x 34 mm -> post-dil from 5.2-4.6 mm); Lesion #2: OM1 95% (Resolute Onyx DES 2.75 mm x15 mm-2.95 mm),   ESOPHAGOGASTRODUODENOSCOPY N/A 10/09/2015   Procedure: ESOPHAGOGASTRODUODENOSCOPY (EGD);  Surgeon: [name], MD;  Location: [facility];  Service: Endoscopy;  Laterality: N/A;   FOOT SURGERY Right    bone cutting   HEMORROIDECTOMY  2003   LEFT HEART CATH AND CORONARY ANGIOGRAPHY N/A 05/07/2020   Procedure: LEFT HEART CATH AND CORONARY ANGIOGRAPHY;  Surgeon: Leonie Man, MD;  Location: [facility];;  pRCA 60%& mRCA 95% (Lesion #1 DES PCI), RPAV 40% -RPL 2; OM1 95% (Lesion #2 DES PCI), OM1 60%; small caliber LP AV 65% and 99% unable to cannulate (likely CTO); p-mLAD 40%, mLAD 40%. EF 45-50%. Mod elevated LVEDP   LIPOMA EXCISION Right 11/08/2013   Procedure: EXCISION NEOPLASM RIGHT ARM;  Surgeon: [name], MD;  Location: [facility];  Service: General;  Laterality: Right;   MASS EXCISION Right 11/08/2013   Procedure: EXCISION NEOPLASM SCROTUM;  Surgeon: [name], MD;  Location: [facility];  Service: General;  Laterality: Right;   TRANSTHORACIC ECHOCARDIOGRAM  05/07/2020   In setting of inferior STEMI: EF 45-50%.  Moderate HK of the mid-apical inferior and lateral wall.  Otherwise normal.   TRANSTHORACIC ECHOCARDIOGRAM  08/21/2020   EF 65-70%. Gr 2 DD. No RWMA. Mild Asc Ao dilation - 39 mm. Normal valves. Normal RV. Normal Atriae.     Current Outpatient Medications  Medication Sig Dispense Refill   atorvastatin (LIPITOR) 80 MG tablet TAKE 1 TABLET BY MOUTH ONCE A DAY 90 tablet 3   carvedilol (COREG) 12.5 MG tablet TAKE 1 TABLET BY MOUTH 2 TIMES DAILY WITH MEALS 180 tablet 3   Cholecalciferol (VITAMIN D3) 25 MCG (1000 UT) CAPS Take by mouth.     ezetimibe (ZETIA) 10 MG tablet TAKE 1 TABLET (10 MG TOTAL) BY MOUTH DAILY. 90 tablet 3   losartan (COZAAR) 50 MG tablet TAKE 1 TABLET BY MOUTH DAILY. 90 tablet  3   nitroGLYCERIN (NITROSTAT) 0.4 MG SL tablet Place 1 tablet (0.4 mg total) under the tongue every 5 (five) minutes x 3 doses as needed for chest pain. 25 tablet 0   ticagrelor (BRILINTA) 60 MG TABS tablet Take 1 tablet (60 mg total) by mouth 2 (two) times daily. 180 tablet 3   vitamin C (ASCORBIC ACID) 500 MG tablet Take 2,000 mg by mouth daily.      furosemide (LASIX) 40 MG tablet Take 1 tablet (40 mg total) by mouth daily as needed for swelling 30 tablet 3   No current facility-administered medications for this visit.    Allergies:   Patient has no known allergies.    Social History:  The patient  reports that he has quit smoking. His smoking use included cigarettes. He has a 5.00  pack-year smoking history. He has never used smokeless tobacco. He reports current alcohol use. He reports current drug use. Drug: Marijuana.   Family History:  The patient's family history includes Healthy in his mother; Heart attack in his maternal uncle; Heart attack (age of onset: 29) in his paternal uncle; Heart attack (age of onset: 24) in his maternal grandfather; Parkinson's disease (age of onset: 11) in his paternal grandfather.    ROS:  Please see the history of present illness.   Otherwise, review of systems are positive for none.   All other systems are reviewed and negative.    PHYSICAL EXAM: VS:  BP 132/84   Pulse 64   Ht '5\' 10"'$  (1.778 m)   Wt (!) 311 lb 12.8 oz (141.4 kg)   SpO2 98%   BMI 44.74 kg/m  , BMI Body mass index is 44.74 kg/m. GEN: Well nourished, well developed, in no acute distress HEENT: sclera anicteric Neck: no JVD, carotid bruits, or masses Cardiac: RRR; no murmurs, rubs, or gallops,no edema  Respiratory:  clear to auscultation bilaterally, normal work of breathing GI: soft, obese, nontender, nondistended, + BS MS: no deformity or atrophy Skin: warm and dry, no rash Neuro:  Strength and sensation are intact Psych: euthymic mood, full affect   EKG:  EKG is ordered  today. The ekg ordered today demonstrates sinus rhythm, rate 64 bpm, non-specific T wave abnormalities, no STE/D, no significant change from previous.    Recent Labs: 01/21/2021: ALT 112; BUN 13; Creatinine, Ser 1.01; Potassium 4.6; Sodium 142    Lipid Panel    Component Value Date/Time   CHOL 107 05/25/2021 0831   TRIG 132 05/25/2021 0831   HDL 30 (L) 05/25/2021 0831   CHOLHDL 3.6 05/25/2021 0831   CHOLHDL 6.2 05/07/2020 0821   VLDL 22 05/07/2020 0821   LDLCALC 53 05/25/2021 0831      Wt Readings from Last 3 Encounters:  09/01/21 (!) 311 lb 12.8 oz (141.4 kg)  05/26/21 (!) 332 lb (150.6 kg)  01/27/21 (!) 324 lb (147 kg)      Other studies Reviewed: Additional studies/ records that were reviewed today include:   LHC 05/07/20:   There is no aortic valve stenosis. CULPRIT LESION #1 prox RCA-1 lesion is 60% stenosed. Prox RCA-2 lesion is 95% stenosed. A drug-eluting stent was successfully placed covering both lesions using a STENT RESOLUTE ONYX 4.0X34. Postdilated and taper fashion from 5.2-4.6 mm Post intervention, there is a 0% residual stenosis. RPAV lesion is 40% stenosed with 40% stenosed side branch in 2nd RPL. --- CULPRIT LESION #2 1st Mrg lesion is 95% stenosed. A drug-eluting stent was successfully placed using a STENT RESOLUTE ONYX 2.75 X 15 -> postdilated to 2.95 mm Post intervention, there is a 0% residual stenosis. -- Lat 1st Mrg lesion is 60% stenosed. LPAV-1 lesion is 65% stenosed. LPAV-2 lesion is 99% stenosed. -> Unable to cross with wire, likely currently occluded --- Prox LAD to Mid LAD lesion is 40% stenosed. Mid LAD lesion is 40% stenosed. --- There is mild left ventricular systolic dysfunction. The left ventricular ejection fraction is 45-50% by visual estimate. LV end diastolic pressure is moderately elevated. Prox RCA-2 lesion is 95% stenosed.   SUMMARY SEVERE THREE-VESSEL DISEASE with proximal RCA 60% followed by mid RCA 95% thrombotic  subtotal occlusion (culprit lesion), 99% thrombotic stenosis of large bifurcating OM1 and 70 to 100% likely CTO occlusion of AV groove circumflex (unable to cross). Successful DES PCI of proximal to mid RCA  using Resolute Onyx 4.0 mm x 34 mm postdilated to  5.2 mm proximal and 4.6 mm distal Successful DES PCI of ostial OM1 with Resolute Onyx DES 2.75 mm x15 mm postdilated to 2.95 mm Poor visualization on left ventriculogram.  Would recommend echocardiogram.  Could not evaluate wall motion, however EF did not appear to be decreased. Moderately elevated LVEDP. Severe systemic hypertension.     RECOMMENDATIONS Admit to CCU for ongoing care, or on Aggrastat until current bottle complete. Aggressive risk factor modification-we will check lipid panel start atorvastatin 80 mg daily Start carvedilol titrate accordingly blood pressure..  Medications ordered. Smoking cessation counseling 2D echo ordered With two-vessel PCI large territory involved, likely not a candidate for fast-track discharge.      Echocardiogram 05/07/20:   1. Left ventricular ejection fraction, by estimation, is 45 to 50%. The  left ventricle has mildly decreased function. The left ventricle  demonstrates regional wall motion abnormalities (see scoring  diagram/findings for description). Left ventricular  diastolic parameters are indeterminate. There is moderate hypokinesis of  the left ventricular, mid-apical inferior wall and lateral wall.   2. Right ventricular systolic function is normal. The right ventricular  size is normal.   3. The mitral valve is normal in structure. No evidence of mitral valve  regurgitation. No evidence of mitral stenosis.   4. The aortic valve is tricuspid. Aortic valve regurgitation is not  visualized. No aortic stenosis is present.   5. The inferior vena cava is normal in size with <50% respiratory  variability, suggesting right atrial pressure of 8 mmHg.    Echocardiogram 07/2020: 1. Left  ventricular ejection fraction, by estimation, is 65 to 70%. The  left ventricle has normal function. The left ventricle has no regional  wall motion abnormalities. Left ventricular diastolic parameters are  consistent with Grade II diastolic  dysfunction (pseudonormalization).   2. Right ventricular systolic function is normal. The right ventricular  size is normal. There is normal pulmonary artery systolic pressure.   3. The mitral valve is normal in structure. No evidence of mitral valve  regurgitation. No evidence of mitral stenosis.   4. The aortic valve is tricuspid. Aortic valve regurgitation is not  visualized. No aortic stenosis is present.   5. Aortic dilatation noted. There is mild dilatation at the level of the  sinuses of Valsalva measuring 39 mm.   6. The inferior vena cava is normal in size with greater than 50%  respiratory variability, suggesting right atrial pressure of 3 mmHg.       ASSESSMENT AND PLAN:  1. CAD s/p PCI/DES to RCA and OM1 05/05/20: no recurrent chest pain.  - Continue brilinta maintenance dose 60 mg BID - Continue statin and zetia - Continue carvedilol   2. Chronic combined CHF/ischemic cardiomyopathy: EF 45-50% on echo at the time of his STEMI with improvement in EF to 65-70% 07/2020. He has taking his prn lasix once in the past 3 months. - Continue prn lasix - Continue carvedilol and losartan   3. HTN: BP 132/84 today.  - Continue carvedilol and losartan   4. HLD: LDL 85 12/2020. Started on zetia 01/2021, in addition to atorvastatin. Repeat FLP 05/25/21 showed improvement in LDL to 53 and triglycerides down from 315>135 - Continue atorvastatin and zetia   5. Tobacco abuse: he has not smoked since his MI  - Continue to encourage smoking abstinence    6. Suspected OSA: he is morbidly obese with snoring and insomnia. Ordered for a sleep study  1 year ago but has yet to complete this.    7. Morbid obesity: Weight 311lbs today, down from 332lb at last  visit 05/2021. Again offered referral to [facility] but he would like to continue managing on his own. - Encouraged ongoing dietary/lifestyle modifications to promote weight loss  8. Hip pain: most noticeable when getting up from his car and generally improves with activity. He does feel like this limits his motivation to get started with exercise. Encouraged tylenol use and avoidance of NSAIDs - Encouraged him to establish care with an orthopedist for suspected OA   Current medicines are reviewed at length with the patient today.  The patient does not have concerns regarding medicines.  The following changes have been made:  As above  Labs/ tests ordered today include:   Orders Placed This Encounter  Procedures   EKG 12-Lead      Disposition:   FU with Dr. Ellyn Hack in 6 months  Signed, Abigail Butts, PA-C  09/01/2021 8:56 AM

## 2021-09-01 ENCOUNTER — Encounter: Payer: Self-pay | Admitting: Medical

## 2021-09-01 ENCOUNTER — Other Ambulatory Visit: Payer: Self-pay

## 2021-09-01 ENCOUNTER — Ambulatory Visit (INDEPENDENT_AMBULATORY_CARE_PROVIDER_SITE_OTHER): Payer: 59 | Admitting: Medical

## 2021-09-01 VITALS — BP 132/84 | HR 64 | Ht 70.0 in | Wt 311.8 lb

## 2021-09-01 DIAGNOSIS — E785 Hyperlipidemia, unspecified: Secondary | ICD-10-CM | POA: Diagnosis not present

## 2021-09-01 DIAGNOSIS — R29818 Other symptoms and signs involving the nervous system: Secondary | ICD-10-CM

## 2021-09-01 DIAGNOSIS — I10 Essential (primary) hypertension: Secondary | ICD-10-CM

## 2021-09-01 DIAGNOSIS — I2510 Atherosclerotic heart disease of native coronary artery without angina pectoris: Secondary | ICD-10-CM | POA: Diagnosis not present

## 2021-09-01 DIAGNOSIS — I5042 Chronic combined systolic (congestive) and diastolic (congestive) heart failure: Secondary | ICD-10-CM

## 2021-09-01 NOTE — Patient Instructions (Addendum)
Medication Instructions:  No Changes *If you need a refill on your cardiac medications before your next appointment, please call your pharmacy*   Lab Work: No Labs If you have labs (blood work) drawn today and your tests are completely normal, you will receive your results only by: [place] (if you have MyChart) OR A paper copy in the mail If you have any lab test that is abnormal or we need to change your treatment, we will call you to review the results.   Testing/Procedures: No Testing   Follow-Up: At [facility], you and your health needs are our priority.  As part of our continuing mission to provide you with exceptional heart care, we have created designated Provider Care Teams.  These Care Teams include your primary Cardiologist (physician) and Advanced Practice Providers (APPs -  Physician Assistants and Nurse Practitioners) who all work together to provide you with the care you need, when you need it.  We recommend signing up for the patient portal called "MyChart".  Sign up information is provided on this After Visit Summary.  MyChart is used to connect with patients for Virtual Visits (Telemedicine).  Patients are able to view lab/test results, encounter notes, upcoming appointments, etc.  Non-urgent messages can be sent to your provider as well.   To learn more about what you can do with MyChart, go to [email or web address].    Your next appointment:   6 month(s)  The format for your next appointment:   In Person  Provider:   Glenetta Hew, MD   Other Instructions Consider Orthopaedic consultation for hip pain.

## 2021-09-06 ENCOUNTER — Other Ambulatory Visit (HOSPITAL_COMMUNITY): Payer: Self-pay

## 2021-09-18 ENCOUNTER — Other Ambulatory Visit (HOSPITAL_COMMUNITY): Payer: Self-pay

## 2021-10-10 MED FILL — Ezetimibe Tab 10 MG: ORAL | 90 days supply | Qty: 90 | Fill #2 | Status: AC

## 2021-10-11 ENCOUNTER — Other Ambulatory Visit (HOSPITAL_COMMUNITY): Payer: Self-pay

## 2021-10-15 ENCOUNTER — Other Ambulatory Visit (HOSPITAL_COMMUNITY): Payer: Self-pay

## 2021-10-18 ENCOUNTER — Other Ambulatory Visit (HOSPITAL_COMMUNITY): Payer: Self-pay

## 2021-10-28 ENCOUNTER — Other Ambulatory Visit (HOSPITAL_COMMUNITY): Payer: Self-pay

## 2021-11-08 MED FILL — Carvedilol Tab 12.5 MG: ORAL | 90 days supply | Qty: 180 | Fill #2 | Status: AC

## 2021-11-09 ENCOUNTER — Other Ambulatory Visit (HOSPITAL_COMMUNITY): Payer: Self-pay

## 2021-11-17 ENCOUNTER — Other Ambulatory Visit (HOSPITAL_COMMUNITY): Payer: Self-pay

## 2021-11-17 MED FILL — Losartan Potassium Tab 50 MG: ORAL | 90 days supply | Qty: 90 | Fill #2 | Status: AC

## 2021-11-22 ENCOUNTER — Other Ambulatory Visit (HOSPITAL_COMMUNITY): Payer: Self-pay

## 2022-01-09 ENCOUNTER — Other Ambulatory Visit: Payer: Self-pay | Admitting: Cardiology

## 2022-01-10 ENCOUNTER — Other Ambulatory Visit (HOSPITAL_COMMUNITY): Payer: Self-pay

## 2022-01-10 MED ORDER — EZETIMIBE 10 MG PO TABS
ORAL_TABLET | Freq: Every day | ORAL | 3 refills | Status: DC
  Filled 2022-01-10: qty 90, 90d supply, fill #0
  Filled 2022-04-07: qty 90, 90d supply, fill #1
  Filled 2022-07-11: qty 90, 90d supply, fill #2
  Filled 2022-10-10: qty 90, 90d supply, fill #3

## 2022-01-11 ENCOUNTER — Other Ambulatory Visit (HOSPITAL_COMMUNITY): Payer: Self-pay

## 2022-01-27 ENCOUNTER — Other Ambulatory Visit (HOSPITAL_COMMUNITY): Payer: Self-pay

## 2022-02-03 ENCOUNTER — Other Ambulatory Visit: Payer: Self-pay | Admitting: Cardiology

## 2022-02-04 ENCOUNTER — Other Ambulatory Visit (HOSPITAL_COMMUNITY): Payer: Self-pay

## 2022-02-04 MED ORDER — CARVEDILOL 12.5 MG PO TABS
ORAL_TABLET | ORAL | 0 refills | Status: DC
  Filled 2022-02-04: qty 180, 90d supply, fill #0

## 2022-02-14 ENCOUNTER — Other Ambulatory Visit: Payer: Self-pay | Admitting: Cardiology

## 2022-02-15 ENCOUNTER — Other Ambulatory Visit (HOSPITAL_COMMUNITY): Payer: Self-pay

## 2022-02-15 MED ORDER — LOSARTAN POTASSIUM 50 MG PO TABS
ORAL_TABLET | Freq: Every day | ORAL | 3 refills | Status: DC
  Filled 2022-02-15: qty 90, 90d supply, fill #0

## 2022-02-22 ENCOUNTER — Encounter: Payer: Self-pay | Admitting: Cardiology

## 2022-02-23 ENCOUNTER — Ambulatory Visit (INDEPENDENT_AMBULATORY_CARE_PROVIDER_SITE_OTHER): Payer: 59 | Admitting: Cardiology

## 2022-02-23 ENCOUNTER — Other Ambulatory Visit: Payer: Self-pay

## 2022-02-23 ENCOUNTER — Encounter: Payer: Self-pay | Admitting: Cardiology

## 2022-02-23 ENCOUNTER — Other Ambulatory Visit (HOSPITAL_COMMUNITY): Payer: Self-pay

## 2022-02-23 VITALS — BP 144/86 | HR 71 | Ht 70.0 in | Wt 337.4 lb

## 2022-02-23 DIAGNOSIS — Z87891 Personal history of nicotine dependence: Secondary | ICD-10-CM | POA: Diagnosis not present

## 2022-02-23 DIAGNOSIS — I2510 Atherosclerotic heart disease of native coronary artery without angina pectoris: Secondary | ICD-10-CM

## 2022-02-23 DIAGNOSIS — I10 Essential (primary) hypertension: Secondary | ICD-10-CM | POA: Diagnosis not present

## 2022-02-23 DIAGNOSIS — Z9861 Coronary angioplasty status: Secondary | ICD-10-CM

## 2022-02-23 DIAGNOSIS — E7849 Other hyperlipidemia: Secondary | ICD-10-CM | POA: Diagnosis not present

## 2022-02-23 DIAGNOSIS — I2119 ST elevation (STEMI) myocardial infarction involving other coronary artery of inferior wall: Secondary | ICD-10-CM

## 2022-02-23 DIAGNOSIS — I25709 Atherosclerosis of coronary artery bypass graft(s), unspecified, with unspecified angina pectoris: Secondary | ICD-10-CM

## 2022-02-23 MED ORDER — LOSARTAN POTASSIUM 100 MG PO TABS
100.0000 mg | ORAL_TABLET | Freq: Every day | ORAL | 3 refills | Status: DC
  Filled 2022-02-23: qty 90, 90d supply, fill #0
  Filled 2022-06-29: qty 90, 90d supply, fill #1
  Filled 2022-09-28: qty 90, 90d supply, fill #2
  Filled 2022-12-23: qty 90, 90d supply, fill #3

## 2022-02-23 NOTE — Progress Notes (Unsigned)
Primary Care Provider: Pcp, No Cardiologist: Glenetta Hew, MD Electrophysiologist: None  Clinic Note: No chief complaint on file.   ===================================  ASSESSMENT/PLAN   Problem List Items Addressed This Visit   None   ===================================  HPI:    Gordon Richards is a 61 y.o. male with a PMH below who presents today for ***..  PMH notable for inferior STEMI-(May 07, 2020 -> PCI to RCA with staged PCI to OM1 and 99% likely CTO of LP AV), Ischemic CM EF of 45%, HTN and HLD along with obesity who presents here for follow-up.  I last saw Gordon Richards in February 2022.  He was subsequently seen in September 2022 by Gordon Lofts, PA most recent visit was in September.  He was doing relatively well.  Had lost 22 pounds.  Was having hip problems.  Recommended [employer].  No chest pain or dyspnea with rest or exertion.  No PND, with apnea.  No palpitations. He declined referral to [facility]; smoking cessation counseling.  No changes to meds.  Recent Hospitalizations: none  Reviewed  CV studies:    The following studies were reviewed today: (if available, images/films reviewed: From Epic Chart or Care Everywhere) No new studies:   Interval History:   Gordon Richards  ***  Trying to get back on"Driveway Diet" --  lost control over holidays - gained back to > 350 - now down to 337.   Gets some DOE walkng up-hill, but hoping to get back to 2 miles  --> limited by Hip pain.   CV Review of Symptoms (Summary) Cardiovascular ROS: positive for - dyspnea on exertion and - with walkin uphill - but mostly limited by Hip pain negative for - chest pain, edema, irregular heartbeat, orthopnea, palpitations, paroxysmal nocturnal dyspnea, rapid heart rate, shortness of breath, or syncope/near syncope; TIA/amaurosis fugax, claudication  REVIEWED OF SYSTEMS   Review of Systems  Constitutional:  Negative for malaise/fatigue and weight  loss.  HENT:  Negative for congestion.   Respiratory:  Negative for cough and shortness of breath.   Cardiovascular:        Per HPI  Gastrointestinal:  Negative for blood in stool and melena.  Genitourinary:  Negative for hematuria.  Musculoskeletal:  Positive for joint pain (R hip - limits wgt.).  Neurological:  Negative for dizziness, focal weakness and weakness.  Psychiatric/Behavioral:  Negative for depression and memory loss. The patient is not nervous/anxious and does not have insomnia.    I have reviewed and (if needed) personally updated the patient's problem list, medications, allergies, past medical and surgical history, social and family history.   PAST MEDICAL HISTORY   Past Medical History:  Diagnosis Date   Acute ST elevation myocardial infarction (STEMI) of inferior wall ([place]) 05/07/2020   Likely culprit lesion was 95% thrombotic mRCA (in tandem with pRCA 60%) - Lesion #1 DES PCI; OM1 also had 95% lesion (porential culprit - Lesion #2 DES PCI); ~CTO of AVG Cx after OM1 (unable to cross). EF 45-50% - Mod LVEDP with severe systemic HTN.    CAD S/P DES PCI - pRCA, ostOM1. CTO of AVG Cx 05/08/2020    pRCA 60%& mRCA 95% (Resolute Onyx DES 4.0 mm x 34 mm -> postdilated from 5.2-4.6 mm), RPAV 40% -RPL 2; OM1 95% (Resolute Onyx DES 2.75 mm x15 mm-2.95 mm), OM1 60%, small caliber LP AV 65% and 99% granulated with no loss (likely CTO); p-mLAD 40%, mLAD 40%.   Hyperlipidemia due to  dietary fat intake 05/08/2020   Morbid obesity ([place]) 05/08/2020    PAST SURGICAL HISTORY   Past Surgical History:  Procedure Laterality Date   COLONOSCOPY WITH PROPOFOL N/A 10/26/2015   Procedure: COLONOSCOPY WITH PROPOFOL;  Surgeon: [name], MD;  [facility]: [facility];  Service: Endoscopy;  Laterality: N/A;  Cecum time in  0748  time out  0758  total time 10 minutes   CORONARY/GRAFT ACUTE MI REVASCULARIZATION N/A 05/07/2020   Procedure: Coronary/Graft Acute MI Revascularization;  Surgeon: Leonie Man, MD;  Location: [facility];; INF STEMI: Lesions #1: pRCA 60%& mRCA 95% (covering both: Resolute Onyx DES 4.0 mm x 34 mm -> post-dil from 5.2-4.6 mm); Lesion #2: OM1 95% (Resolute Onyx DES 2.75 mm x15 mm-2.95 mm),   ESOPHAGOGASTRODUODENOSCOPY N/A 10/09/2015   Procedure: ESOPHAGOGASTRODUODENOSCOPY (EGD);  Surgeon: [name], MD;  [facility];  Service: Endoscopy;  Laterality: N/A;   FOOT SURGERY Right    bone cutting   HEMORROIDECTOMY  2003   LEFT HEART CATH AND CORONARY ANGIOGRAPHY N/A 05/07/2020   Procedure: LEFT HEART CATH AND CORONARY ANGIOGRAPHY;  Surgeon: Leonie Man, MD;  [facility];;  pRCA 60%& mRCA 95% (Lesion #1 DES PCI), RPAV 40% -RPL 2; OM1 95% (Lesion #2 DES PCI), OM1 60%; small caliber LP AV 65% and 99% unable to cannulate (likely CTO); p-mLAD 40%, mLAD 40%. EF 45-50%. Mod elevated LVEDP   LIPOMA EXCISION Right 11/08/2013   Procedure: EXCISION NEOPLASM RIGHT ARM;  Surgeon: [name], MD;  Location: [facility];  Service: General;  Laterality: Right;   MASS EXCISION Right 11/08/2013   Procedure: EXCISION NEOPLASM SCROTUM;  Surgeon: [name], MD;  Location: [facility];  Service: General;  Laterality: Right;   TRANSTHORACIC ECHOCARDIOGRAM  05/07/2020   In setting of inferior STEMI: EF 45-50%.  Moderate HK of the mid-apical inferior and lateral wall.  Otherwise normal.   TRANSTHORACIC ECHOCARDIOGRAM  08/21/2020   EF 65-70%. Gr 2 DD. No RWMA. Mild Asc Ao dilation - 39 mm. Normal valves. Normal RV. Normal Atriae.     There is no immunization history on file for this patient.  MEDICATIONS/ALLERGIES   Current Meds  Medication Sig   atorvastatin (LIPITOR) 80 MG tablet TAKE 1 TABLET BY MOUTH ONCE A DAY   carvedilol (COREG) 12.5 MG tablet TAKE 1 TABLET BY MOUTH 2 TIMES DAILY WITH MEALS   Cholecalciferol (VITAMIN D3) 25 MCG (1000 UT) CAPS Take by mouth.   diphenhydramine-acetaminophen (TYLENOL PM) 25-500 MG TABS tablet Take 1  tablet by mouth at bedtime as needed.   ezetimibe (ZETIA) 10 MG tablet TAKE 1 TABLET BY MOUTH DAILY.   losartan (COZAAR) 50 MG tablet TAKE 1 TABLET BY MOUTH DAILY.   nitroGLYCERIN (NITROSTAT) 0.4 MG SL tablet Place 1 tablet (0.4 mg total) under the tongue every 5 (five) minutes x 3 doses as needed for chest pain.   ticagrelor (BRILINTA) 60 MG TABS tablet Take 1 tablet (60 mg total) by mouth 2 (two) times daily.   vitamin C (ASCORBIC ACID) 500 MG tablet Take 2,000 mg by mouth daily.     No Known Allergies  SOCIAL HISTORY/FAMILY HISTORY   Reviewed in Epic:  Pertinent findings:  Social History   Tobacco Use   Smoking status: Former    Packs/day: 1.00    Years: 5.00    Pack years: 5.00    Types: Cigarettes   Smokeless tobacco: Never  Substance Use Topics  Alcohol use: Yes    Alcohol/week: 0.0 standard drinks    Comment: rarely   Drug use: Yes    Types: Marijuana    Comment: 10/18/15   Social History   Social History Narrative   He lives with his wife.   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    OBJCTIVE -PE, EKG, labs   Wt Readings from Last 3 Encounters:  02/23/22 (!) 337 lb 6.4 oz (153 kg)  09/01/21 (!) 311 lb 12.8 oz (141.4 kg)  05/26/21 (!) 332 lb (150.6 kg)    Physical Exam: BP (!) 144/86    Pulse 71    Ht 5\' 10"  (1.778 m)    Wt (!) 337 lb 6.4 oz (153 kg)    SpO2 95%    BMI 48.41 kg/m  Physical Exam Constitutional:      General: He is not in acute distress.    Appearance: He is obese. He is not ill-appearing or toxic-appearing.     Comments: Morbidly obese; well groomed  HENT:     Head: Normocephalic and atraumatic.  Neck:     Vascular: No carotid bruit.  Cardiovascular:     Rate and Rhythm: Normal rate and regular rhythm. No extrasystoles are present.    Chest Wall: PMI is not displaced (unable to assess 2/2 obesity).     Pulses: Normal pulses.     Heart sounds: S1 normal and S2 normal. Heart sounds are distant. No murmur  heard.   No friction rub. No gallop.  Pulmonary:     Effort: Pulmonary effort is normal. No respiratory distress.     Breath sounds: Normal breath sounds. No wheezing, rhonchi or rales.  Musculoskeletal:        General: No swelling.     Cervical back: Normal range of motion and neck supple.  Skin:    Coloration: Skin is not jaundiced or pale.  Neurological:     General: No focal deficit present.     Mental Status: He is alert and oriented to person, place, and time.     Gait: Gait abnormal.  Psychiatric:        Mood and Affect: Mood normal.        Behavior: Behavior normal.        Thought Content: Thought content normal.        Judgment: Judgment normal.     Adult ECG Report  Not checked  Recent Labs:  reviewed   Lab Results  Component Value Date   CHOL 107 05/25/2021   HDL 30 (L) 05/25/2021   LDLCALC 53 05/25/2021   TRIG 132 05/25/2021   CHOLHDL 3.6 05/25/2021   Lab Results  Component Value Date   CREATININE 1.01 01/21/2021   BUN 13 01/21/2021   NA 142 01/21/2021   K 4.6 01/21/2021   CL 102 01/21/2021   CO2 23 01/21/2021   CBC Latest Ref Rng & Units 05/08/2020 05/07/2020 05/07/2020  WBC 4.0 - 10.5 K/uL 12.0(H) 11.9(H) 12.2(H)  Hemoglobin 13.0 - 17.0 g/dL 15.2 16.2 16.5  Hematocrit 39.0 - 52.0 % 46.7 48.4 50.0  Platelets 150 - 400 K/uL 223 237 221    Lab Results  Component Value Date   HGBA1C 6.1 (H) 05/25/2021   Lab Results  Component Value Date   TSH 2.520 05/07/2020    ==================================================  COVID-19 Education: The signs and symptoms of COVID-19 were discussed with the patient and how to seek care for testing (follow up with PCP  or arrange E-visit).    I spent a total of ***minutes with the patient spent in direct patient consultation.  Additional time spent with chart review  / charting (studies, outside notes, etc): *** min Total Time: *** min  Current medicines are reviewed at length with the patient today.  (+/-  concerns) ***  This visit occurred during the SARS-CoV-2 public health emergency.  Safety protocols were in place, including screening questions prior to the visit, additional usage of staff PPE, and extensive cleaning of exam room while observing appropriate contact time as indicated for disinfecting solutions.  Notice: This dictation was prepared with Dragon dictation along with smart phrase technology. Any transcriptional errors that result from this process are unintentional and may not be corrected upon review.  Studies Ordered:   No orders of the defined types were placed in this encounter.   Patient Instructions / Medication Changes & Studies & Tests Ordered   There are no Patient Instructions on file for this visit.     Glenetta Hew, M.D., M.S. Interventional Cardiologist   Pager # [phone] Phone # [phone] [address] [place], [place] [address]   Thank you for choosing Heart[facility] at [facility]!!

## 2022-02-23 NOTE — Patient Instructions (Addendum)
Medication Instructions:  ? ? Increase losartan to 100 mg daily  ? ?New prescription was sent to the pharmacy  ? ?*If you need a refill on your cardiac medications before your next appointment, please call your pharmacy* ? ? ?Lab Work:fasting ?CBC ?CMP ?LIPID ?HGBA1c ? ?If you have labs (blood work) drawn today and your tests are completely normal, you will receive your results only by: ?MyChart Message (if you have MyChart) OR ?A paper copy in the mail ?If you have any lab test that is abnormal or we need to change your treatment, we will call you to review the results. ? ? ?Testing/Procedures: ?Not needed ? ? ?Follow-Up: ?At [facility], you and your health needs are our priority.  As part of our continuing mission to provide you with exceptional heart care, we have created designated Provider Care Teams.  These Care Teams include your primary Cardiologist (physician) and Advanced Practice Providers (APPs -  Physician Assistants and Nurse Practitioners) who all work together to provide you with the care you need, when you need it. ? ?  ? ?Your next appointment:   ?6 month(s) ? ?The format for your next appointment:   ?In Person ? ?Provider:   ?Coletta Memos, FNP   Then, Glenetta Hew, MD will plan to see you again in 12 month(s). ? ? ? ?

## 2022-03-27 ENCOUNTER — Encounter: Payer: Self-pay | Admitting: Cardiology

## 2022-03-27 DIAGNOSIS — I10 Essential (primary) hypertension: Secondary | ICD-10-CM | POA: Insufficient documentation

## 2022-03-27 NOTE — Assessment & Plan Note (Signed)
Initially did well after my last visit with him having lost most of the 24 pounds again since his MI, only now he has gained that and more back, citing losing control over the holidays.  He is doing some type of "driveway  diet "which I do not necessarily understand.  I simply recommended that he try to use a diet that is tried and true such as Dash or Mediterranean diet.  I think he needs just make major lifestyle changes and how much he eats and what he eats.  I counseled his wife as well. ? ?He keeps saying that he can get back and exercise program but he has not done it.  Says now that he is limited by his hip pain which goes along with his obesity. ? ?If he joined the [employer] continue water walking as well.  He says his job is so busy.  Again question his lack of insight. ? ?He declined referral to [facility] for weight loss counseling.  I truly think he needs to consider bariatric surgery, because he has not lost control of his dietary discretions.  His weight makes it difficult for him to exercise. ?

## 2022-03-27 NOTE — Assessment & Plan Note (Addendum)
Multivessel disease with two-vessel occlusive disease-RCA and LCx OM treated with stents.  Distal LCx medical management.  Unable to cross. ? ?Thankfully, despite him being somewhat sedentary, and no active anginal symptoms. ? ?? He is on carvedilol 12.5 mg twice daily along with losartan 50 mg daily ?? Increase losartan dose to 100 mg daily for additional blood pressure control. ?? On maintenance dose Brilinta 60 mg twice daily without aspirin ?? On atorvastatin 80 mg daily ? ?Thankfully, he is not having active anginal symptoms, probably because he does not exercise enough to notice anything. ? ?I strongly counseled him on again taking things seriously losing weight, diet and exercise.Marland Kitchen ?

## 2022-03-27 NOTE — Assessment & Plan Note (Addendum)
Blood pressure under adequate control today. ? ?Plan: Increase losartan to 100 mg daily and continue carvedilol 12.5 mg twice daily ?? Diet and exercise/weight loss counseling ?

## 2022-03-27 NOTE — Assessment & Plan Note (Signed)
Coming up on 2 years post MI-two-vessel PCI. ? ?Now on maintenance dose Brilinta 60 mg twice daily having stopped aspirin at the 1 year point. ?With two-vessel PCI and ongoing risk factors, and opted to continue with Brilinta monotherapy long-term. ? ?? Okay to hold Brilinta 5-7 days preop for surgeries or procedures. ?

## 2022-03-27 NOTE — Assessment & Plan Note (Signed)
Thankfully, the 1 test that he was able to do successfully with smoking cessation.  I congratulated these efforts. ?

## 2022-03-27 NOTE — Assessment & Plan Note (Signed)
Sizable inferior STEMI with RCA PCI followed by staged circumflex PCI.  Moderate disease elsewhere most notably apical circumflex.  Medically, EF improved to high normal function after revascularization and medications. ? ?Unfortunately, he is lack of insight and ability to control his diet and exercise continue to be the problem. ?

## 2022-03-27 NOTE — Assessment & Plan Note (Addendum)
Last set of labs were in May 2022 and LDL was 53.  Amazingly with his diet and weight gain that his LDL has been so good.   ?We will go and check a lipid panel now along with CMP, CBC and A1c. ? ?Continue atorvastatin 80 mg daily along with Zetia. ? ?Again counseled importance of diet and modification and increased exercise. ?

## 2022-04-08 ENCOUNTER — Other Ambulatory Visit (HOSPITAL_COMMUNITY): Payer: Self-pay

## 2022-04-11 ENCOUNTER — Ambulatory Visit (INDEPENDENT_AMBULATORY_CARE_PROVIDER_SITE_OTHER): Payer: 59

## 2022-04-11 ENCOUNTER — Ambulatory Visit (INDEPENDENT_AMBULATORY_CARE_PROVIDER_SITE_OTHER): Payer: 59 | Admitting: Orthopedic Surgery

## 2022-04-11 ENCOUNTER — Encounter: Payer: Self-pay | Admitting: Orthopedic Surgery

## 2022-04-11 VITALS — BP 166/105 | HR 73 | Ht 70.0 in | Wt 342.8 lb

## 2022-04-11 DIAGNOSIS — M545 Low back pain, unspecified: Secondary | ICD-10-CM

## 2022-04-11 DIAGNOSIS — M25551 Pain in right hip: Secondary | ICD-10-CM

## 2022-04-11 DIAGNOSIS — G8929 Other chronic pain: Secondary | ICD-10-CM

## 2022-04-11 NOTE — Patient Instructions (Signed)
Hip arthritis  ? ?Goal weight 280  ?

## 2022-04-11 NOTE — Progress Notes (Signed)
NEW PROBLEM//OFFICE VISIT ? ? ?Chief Complaint  ?Patient presents with  ? Hip Pain  ?  RT/ chronic  ? ?Mr. Gordon Richards is 61 years old he has a BMI of 49.19 he has coronary artery disease with a previous heart attack.  He comes in with posterior right hip pain in his right buttock which she feels is hip arthritis.  He has trouble raising his leg when he is going up and down the steps he has some pain which has not responded well to Tylenol including extra strength.  He has had pain now for several years noting more pain after he is gaining weight ? ? ? ?ROS: No current chest pain or shortness of breath mild back pain ?ROS ? ? ?BP (!) 166/105   Pulse 73   Ht '5\' 10"'$  (1.778 m)   Wt (!) 342 lb 12.8 oz (155.5 kg)   BMI 49.19 kg/m?  ? ?Body mass index is 49.19 kg/m?. ? ?General appearance: Well-developed well-nourished no gross deformities ? ?Cardiovascular normal pulse and perfusion normal color without edema ? ?Neurologically no sensation loss or deficits or pathologic reflexes ? ?Psychological: Awake alert and oriented x3 mood and affect normal ? ?Skin no lacerations or ulcerations no nodularity no palpable masses, no erythema or nodularity ? ?Musculoskeletal:  ? ?Both limbs line and external rotation leg lengths are equal.  He has no pain when I range his left hip although he has decreased internal rotation. ? ?The right hip he has no tenderness anteriorly no pain in the front of the thigh all of his pain is in the buttock he has external rotation when I flex his hip and he has decreased internal rotation with some pain but also related to the buttock area ? ?His back is nontender ? ? ? ? ? ?Past Medical History:  ?Diagnosis Date  ? Acute ST elevation myocardial infarction (STEMI) of inferior wall ([place]) 05/07/2020  ? Likely culprit lesion was 95% thrombotic mRCA (in tandem with pRCA 60%) - Lesion #1 DES PCI; OM1 also had 95% lesion (porential culprit - Lesion #2 DES PCI); ~CTO of AVG Cx after OM1 (unable to cross). EF  45-50% - Mod LVEDP with severe systemic HTN.   ? CAD S/P DES PCI - pRCA, ostOM1. CTO of AVG Cx 05/08/2020  ?  pRCA 60%& mRCA 95% (Resolute Onyx DES 4.0 mm x 34 mm -> postdilated from 5.2-4.6 mm), RPAV 40% -RPL 2; OM1 95% (Resolute Onyx DES 2.75 mm x15 mm-2.95 mm), OM1 60%, small caliber LP AV 65% and 99% granulated with no loss (likely CTO); p-mLAD 40%, mLAD 40%.  ? Hyperlipidemia due to dietary fat intake 05/08/2020  ? Morbid obesity ([place]) 05/08/2020  ? ? ?Past Surgical History:  ?Procedure Laterality Date  ? COLONOSCOPY WITH PROPOFOL N/A 10/26/2015  ? Procedure: COLONOSCOPY WITH PROPOFOL;  Surgeon: [name], MD;  [facility];  Service: Endoscopy;  Laterality: N/A;  Cecum time in  0748  time out  0758  total time 10 minutes  ? CORONARY/GRAFT ACUTE MI REVASCULARIZATION N/A 05/07/2020  ? Procedure: Coronary/Graft Acute MI Revascularization;  Surgeon: Leonie Man, MD;  Location: [place] [facility] CV LAB;; INF STEMI: Lesions #1: pRCA 60%& mRCA 95% (covering both: Resolute Onyx DES 4.0 mm x 34 mm -> post-dil from 5.2-4.6 mm); Lesion #2: OM1 95% (Resolute Onyx DES 2.75 mm x15 mm-2.95 mm),  ? ESOPHAGOGASTRODUODENOSCOPY N/A 10/09/2015  ? Procedure: ESOPHAGOGASTRODUODENOSCOPY (EGD);  Surgeon: [name], MD;  [facility];  Service: Endoscopy;  Laterality: N/A;  ? FOOT SURGERY Right   ? bone cutting  ? HEMORROIDECTOMY  2003  ? LEFT HEART CATH AND CORONARY ANGIOGRAPHY N/A 05/07/2020  ? Procedure: LEFT HEART CATH AND CORONARY ANGIOGRAPHY;  Surgeon: Leonie Man, MD;  [facility];;  pRCA 60%& mRCA 95% (Lesion #1 DES PCI), RPAV 40% -RPL 2; OM1 95% (Lesion #2 DES PCI), OM1 60%; small caliber LP AV 65% and 99% unable to cannulate (likely CTO); p-mLAD 40%, mLAD 40%. EF 45-50%. Mod elevated LVEDP  ? LIPOMA EXCISION Right 11/08/2013  ? Procedure: EXCISION NEOPLASM RIGHT ARM;  Surgeon: [name], MD;  Location: [facility];  Service: General;  Laterality: Right;  ? MASS EXCISION Right  11/08/2013  ? Procedure: EXCISION NEOPLASM SCROTUM;  Surgeon: [name], MD;  Location: [facility];  Service: General;  Laterality: Right;  ? TRANSTHORACIC ECHOCARDIOGRAM  05/07/2020  ? In setting of inferior STEMI: EF 45-50%.  Moderate HK of the mid-apical inferior and lateral wall.  Otherwise normal.  ? TRANSTHORACIC ECHOCARDIOGRAM  08/21/2020  ? EF 65-70%. Gr 2 DD. No RWMA. Mild Asc Ao dilation - 39 mm. Normal valves. Normal RV. Normal Atriae.  ? ? ?Family History  ?Problem Relation Age of Onset  ? Healthy Mother   ? Heart attack Maternal Grandfather 70  ? Parkinson's disease Paternal Grandfather 20  ? Heart attack Maternal Uncle   ? Heart attack Paternal Uncle 73  ? ?Social History  ? ?Tobacco Use  ? Smoking status: Former  ?  Packs/day: 1.00  ?  Years: 5.00  ?  Pack years: 5.00  ?  Types: Cigarettes  ? Smokeless tobacco: Never  ?Substance Use Topics  ? Alcohol use: Yes  ?  Alcohol/week: 0.0 standard drinks  ?  Comment: rarely  ? Drug use: Yes  ?  Types: Marijuana  ?  Comment: 10/18/15  ? ? ?No Known Allergies ? ?Current Meds  ?Medication Sig  ? atorvastatin (LIPITOR) 80 MG tablet TAKE 1 TABLET BY MOUTH ONCE A DAY  ? carvedilol (COREG) 12.5 MG tablet TAKE 1 TABLET BY MOUTH 2 TIMES DAILY WITH MEALS  ? Cholecalciferol (VITAMIN D3) 25 MCG (1000 UT) CAPS Take by mouth.  ? diphenhydramine-acetaminophen (TYLENOL PM) 25-500 MG TABS tablet Take 1 tablet by mouth at bedtime as needed.  ? ezetimibe (ZETIA) 10 MG tablet TAKE 1 TABLET BY MOUTH DAILY.  ? furosemide (LASIX) 40 MG tablet Take 1 tablet (40 mg total) by mouth daily as needed for swelling  ? losartan (COZAAR) 100 MG tablet Take 1 tablet (100 mg total) by mouth daily.  ? nitroGLYCERIN (NITROSTAT) 0.4 MG SL tablet Place 1 tablet (0.4 mg total) under the tongue every 5 (five) minutes x 3 doses as needed for chest pain.  ? ticagrelor (BRILINTA) 60 MG TABS tablet Take 1 tablet (60 mg total) by mouth 2 (two) times daily.  ? vitamin C (ASCORBIC ACID) 500 MG tablet Take  2,000 mg by mouth daily.   ? ? ? ?MEDICAL DECISION MAKING ? ?A.  ?Encounter Diagnoses  ?Name Primary?  ? Pain in right hip Yes  ? Chronic right-sided low back pain, unspecified whether sciatica present   ? ? ?B. DATA ANALYSED: ? ? ?IMAGING: ?Interpretation of images: I have personally reviewed the images and my interpretation is imaging of his back shows mild degenerative disc disease in his right hip shows severe arthritis with cyst formation in the femoral head without collapse ? ?Left hip  shows moderate arthritis ? ? ?Orders: No new orders ? ?Outside records reviewed: No records reviewed ? ? ?C. MANAGEMENT  ? ?Recommend weight loss for this 61 year old male.  I did remind him that he has severe arthritis in his right hip and he does not have a lot of life left in the hip.  He also has left hip arthritis.  His BMI is 49 and he is not a surgical candidate at that weight with his history of heart disease ? ?He would need to lose approximately 75 pounds to get down to about 275 to be a hip candidate ? ?In the meantime he should continue Tylenol arthritis.  Workout perhaps in the pool. ? ?No orders of the defined types were placed in this encounter. ? ? ? ?Arther Abbott, MD ? ?04/13/2022 ?7:53 AM ? ?

## 2022-04-14 ENCOUNTER — Other Ambulatory Visit (HOSPITAL_COMMUNITY): Payer: Self-pay

## 2022-04-25 ENCOUNTER — Other Ambulatory Visit: Payer: Self-pay | Admitting: Cardiology

## 2022-04-25 ENCOUNTER — Other Ambulatory Visit (HOSPITAL_COMMUNITY): Payer: Self-pay

## 2022-04-25 MED ORDER — ATORVASTATIN CALCIUM 80 MG PO TABS
ORAL_TABLET | Freq: Every day | ORAL | 1 refills | Status: DC
Start: 2022-04-25 — End: 2022-10-24
  Filled 2022-04-25: qty 90, 90d supply, fill #0
  Filled 2022-07-21: qty 90, 90d supply, fill #1

## 2022-05-09 ENCOUNTER — Other Ambulatory Visit: Payer: Self-pay | Admitting: Cardiology

## 2022-05-10 ENCOUNTER — Other Ambulatory Visit (HOSPITAL_COMMUNITY): Payer: Self-pay

## 2022-05-10 MED ORDER — CARVEDILOL 12.5 MG PO TABS
ORAL_TABLET | ORAL | 0 refills | Status: DC
  Filled 2022-05-10: qty 180, 90d supply, fill #0

## 2022-06-13 ENCOUNTER — Other Ambulatory Visit (HOSPITAL_COMMUNITY): Payer: Self-pay

## 2022-06-13 ENCOUNTER — Other Ambulatory Visit: Payer: Self-pay | Admitting: Medical

## 2022-06-13 MED ORDER — TICAGRELOR 60 MG PO TABS
60.0000 mg | ORAL_TABLET | Freq: Two times a day (BID) | ORAL | 3 refills | Status: DC
  Filled 2022-06-13: qty 180, 90d supply, fill #0
  Filled 2022-09-11: qty 180, 90d supply, fill #1
  Filled 2022-12-15: qty 120, 60d supply, fill #2
  Filled 2023-02-09: qty 120, 60d supply, fill #3
  Filled 2023-04-11: qty 120, 60d supply, fill #4

## 2022-06-14 ENCOUNTER — Other Ambulatory Visit (HOSPITAL_COMMUNITY): Payer: Self-pay

## 2022-06-29 ENCOUNTER — Other Ambulatory Visit (HOSPITAL_COMMUNITY): Payer: Self-pay

## 2022-07-11 ENCOUNTER — Other Ambulatory Visit (HOSPITAL_COMMUNITY): Payer: Self-pay

## 2022-07-21 ENCOUNTER — Other Ambulatory Visit (HOSPITAL_COMMUNITY): Payer: Self-pay

## 2022-07-22 ENCOUNTER — Other Ambulatory Visit (HOSPITAL_COMMUNITY): Payer: Self-pay

## 2022-07-28 ENCOUNTER — Ambulatory Visit
Admission: EM | Admit: 2022-07-28 | Discharge: 2022-07-28 | Disposition: A | Discharge status: Home / Self Care | Payer: 59 | Attending: Family Medicine | Admitting: Family Medicine

## 2022-07-28 ENCOUNTER — Encounter: Payer: Self-pay | Admitting: Emergency Medicine

## 2022-07-28 DIAGNOSIS — R2242 Localized swelling, mass and lump, left lower limb: Secondary | ICD-10-CM | POA: Diagnosis not present

## 2022-07-28 MED ORDER — SULFAMETHOXAZOLE-TRIMETHOPRIM 800-160 MG PO TABS: 1.0000 | ORAL_TABLET | Freq: Two times a day (BID) | ORAL | 0 refills | Status: AC

## 2022-07-28 MED ORDER — HYDROCODONE-ACETAMINOPHEN 5-325 MG PO TABS: 1.0000 | ORAL_TABLET | Freq: Two times a day (BID) | ORAL | 0 refills | Status: DC | PRN

## 2022-07-28 NOTE — Discharge Instructions (Signed)
[facility] Address: [address], [place], [place] [address] Closed ? Opens 7:30?AM Fri Phone: [phone]

## 2022-07-28 NOTE — ED Triage Notes (Signed)
Area on top of left foot that is swollen and bruised area in the center that is raised  and red that started today.  No known injury

## 2022-08-01 NOTE — ED Provider Notes (Signed)
[facility]    CSN: [id number] Arrival date & time: 07/28/22  1907      History   Chief Complaint No chief complaint on file.   HPI Gordon Richards is a 61 y.o. male.   Area on top of left foot that is swollen and bruised area in the center that is raised  and red that started today.  No known injury      Past Medical History:  Diagnosis Date   Acute ST elevation myocardial infarction (STEMI) of inferior wall ([place]) 05/07/2020   Likely culprit lesion was 95% thrombotic mRCA (in tandem with pRCA 60%) - Lesion #1 DES PCI; OM1 also had 95% lesion (porential culprit - Lesion #2 DES PCI); ~CTO of AVG Cx after OM1 (unable to cross). EF 45-50% - Mod LVEDP with severe systemic HTN.    CAD S/P DES PCI - pRCA, ostOM1. CTO of AVG Cx 05/08/2020    pRCA 60%& mRCA 95% (Resolute Onyx DES 4.0 mm x 34 mm -> postdilated from 5.2-4.6 mm), RPAV 40% -RPL 2; OM1 95% (Resolute Onyx DES 2.75 mm x15 mm-2.95 mm), OM1 60%, small caliber LP AV 65% and 99% granulated with no loss (likely CTO); p-mLAD 40%, mLAD 40%.   Hyperlipidemia due to dietary fat intake 05/08/2020   Morbid obesity ([place]) 05/08/2020    Patient Active Problem List   Diagnosis Date Noted   Essential hypertension 03/27/2022   Preop cardiovascular exam 01/27/2021   Educated about COVID-19 virus infection 08/31/2020   Former cigarette smoker 08/31/2020   Morbid obesity ([place]) 05/08/2020   Hyperlipidemia due to dietary fat intake 05/08/2020   CAD S/P DES PCI - pRCA, ostOM1. CTO of AVG Cx 05/08/2020   ST elevation myocardial infarction (STEMI) of inferior wall ([place]) 05/07/2020   Coronary artery disease involving native coronary artery of native heart without angina pectoris     Past Surgical History:  Procedure Laterality Date   COLONOSCOPY WITH PROPOFOL N/A 10/26/2015   Procedure: COLONOSCOPY WITH PROPOFOL;  Surgeon: [name], MD;  Location: [facility];  Service: Endoscopy;  Laterality: N/A;  Cecum time in  0748  time out   0758  total time 10 minutes   CORONARY/GRAFT ACUTE MI REVASCULARIZATION N/A 05/07/2020   Procedure: Coronary/Graft Acute MI Revascularization;  Surgeon: Leonie Man, MD;  Location: [facility];; INF STEMI: Lesions #1: pRCA 60%& mRCA 95% (covering both: Resolute Onyx DES 4.0 mm x 34 mm -> post-dil from 5.2-4.6 mm); Lesion #2: OM1 95% (Resolute Onyx DES 2.75 mm x15 mm-2.95 mm),   ESOPHAGOGASTRODUODENOSCOPY N/A 10/09/2015   Procedure: ESOPHAGOGASTRODUODENOSCOPY (EGD);  Surgeon: [name], MD;  [facility];  Service: Endoscopy;  Laterality: N/A;   FOOT SURGERY Right    bone cutting   HEMORROIDECTOMY  2003   LEFT HEART CATH AND CORONARY ANGIOGRAPHY N/A 05/07/2020   Procedure: LEFT HEART CATH AND CORONARY ANGIOGRAPHY;  Surgeon: Leonie Man, MD;  [facility];;  pRCA 60%& mRCA 95% (Lesion #1 DES PCI), RPAV 40% -RPL 2; OM1 95% (Lesion #2 DES PCI), OM1 60%; small caliber LP AV 65% and 99% unable to cannulate (likely CTO); p-mLAD 40%, mLAD 40%. EF 45-50%. Mod elevated LVEDP   LIPOMA EXCISION Right 11/08/2013   Procedure: EXCISION NEOPLASM RIGHT ARM;  Surgeon: [name], MD;  Location: [facility];  Service: General;  Laterality: Right;   MASS EXCISION Right 11/08/2013   Procedure: EXCISION NEOPLASM SCROTUM;  Surgeon: [name],  MD;  Location: [facility];  Service: General;  Laterality: Right;   TRANSTHORACIC ECHOCARDIOGRAM  05/07/2020   In setting of inferior STEMI: EF 45-50%.  Moderate HK of the mid-apical inferior and lateral wall.  Otherwise normal.   TRANSTHORACIC ECHOCARDIOGRAM  08/21/2020   EF 65-70%. Gr 2 DD. No RWMA. Mild Asc Ao dilation - 39 mm. Normal valves. Normal RV. Normal Atriae.       Home Medications    Prior to Admission medications   Medication Sig Start Date End Date Taking? Authorizing Provider  HYDROcodone-acetaminophen (NORCO/VICODIN) 5-325 MG tablet Take 1 tablet by mouth 2 (two) times daily as needed for moderate pain. 07/28/22   Yes Volney American, PA-C  sulfamethoxazole-trimethoprim (BACTRIM DS) 800-160 MG tablet Take 1 tablet by mouth 2 (two) times daily for 7 days. 07/28/22 08/04/22 Yes Volney American, PA-C  atorvastatin (LIPITOR) 80 MG tablet TAKE 1 TABLET BY MOUTH ONCE A DAY 04/25/22 04/25/23  Leonie Man, MD  carvedilol (COREG) 12.5 MG tablet TAKE 1 TABLET BY MOUTH 2 TIMES DAILY WITH MEALS 05/10/22 05/10/23  Leonie Man, MD  Cholecalciferol (VITAMIN D3) 25 MCG (1000 UT) CAPS Take by mouth.    [provider]  diphenhydramine-acetaminophen (TYLENOL PM) 25-500 MG TABS tablet Take 1 tablet by mouth at bedtime as needed.    [provider]  ezetimibe (ZETIA) 10 MG tablet TAKE 1 TABLET BY MOUTH DAILY. 01/10/22 01/10/23  Leonie Man, MD  furosemide (LASIX) 40 MG tablet Take 1 tablet (40 mg total) by mouth daily as needed for swelling 05/26/21 04/11/22  Kroeger, Lorelee Cover., PA-C  losartan (COZAAR) 100 MG tablet Take 1 tablet (100 mg total) by mouth daily. 02/23/22 09/28/22  Leonie Man, MD  nitroGLYCERIN (NITROSTAT) 0.4 MG SL tablet Place 1 tablet (0.4 mg total) under the tongue every 5 (five) minutes x 3 doses as needed for chest pain. 05/09/20   Kathyrn Drown D, NP  ticagrelor (BRILINTA) 60 MG TABS tablet Take 1 tablet (60 mg total) by mouth 2 (two) times daily. 06/13/22   Leonie Man, MD  vitamin C (ASCORBIC ACID) 500 MG tablet Take 2,000 mg by mouth daily.     [provider]    Family History Family History  Problem Relation Age of Onset   Healthy Mother    Heart attack Maternal Grandfather 50   Parkinson's disease Paternal Grandfather 9   Heart attack Maternal Uncle    Heart attack Paternal Uncle 81    Social History Social History   Tobacco Use   Smoking status: Former    Packs/day: 1.00    Years: 5.00    Total pack years: 5.00    Types: Cigarettes   Smokeless tobacco: Never  Substance Use Topics   Alcohol use: Yes    Alcohol/week: 0.0 standard  drinks of alcohol    Comment: rarely   Drug use: Yes    Types: Marijuana    Comment: 10/18/15     Allergies   Patient has no known allergies.   Review of Systems Review of Systems PER HPI  Physical Exam Triage Vital Signs ED Triage Vitals  Enc Vitals Group     BP 07/28/22 1913 (!) 159/94     Pulse Rate 07/28/22 1913 74     Resp 07/28/22 1913 18     Temp 07/28/22 1913 97.9 F (36.6 C)     Temp Source 07/28/22 1913 Oral     SpO2 07/28/22 1913 94 %  Weight --      Height --      Head Circumference --      Peak Flow --      Pain Score 07/28/22 1914 9     Pain Loc --      Pain Edu? --      Excl. in [place]? --    No data found.  Updated Vital Signs BP (!) 159/94 (BP Location: Right Arm)   Pulse 74   Temp 97.9 F (36.6 C) (Oral)   Resp 18   SpO2 94%   Visual Acuity Right Eye Distance:   Left Eye Distance:   Bilateral Distance:    Right Eye Near:   Left Eye Near:    Bilateral Near:     Physical Exam Vitals and nursing note reviewed.  Constitutional:      Appearance: Normal appearance.  HENT:     Head: Atraumatic.     Mouth/Throat:     Mouth: Mucous membranes are moist.     Pharynx: Oropharynx is clear.  Eyes:     Extraocular Movements: Extraocular movements intact.     Conjunctiva/sclera: Conjunctivae normal.     Pupils: Pupils are equal, round, and reactive to light.  Cardiovascular:     Rate and Rhythm: Normal rate and regular rhythm.  Pulmonary:     Effort: Pulmonary effort is normal.     Breath sounds: Normal breath sounds.  Musculoskeletal:        General: Swelling and tenderness present. Normal range of motion.     Cervical back: Normal range of motion and neck supple.     Comments: Left foot significantly swollen, tender to palpation  Skin:    Comments: Center of left foot dorsally bruised with central papular semifirm lesion.  No active drainage, area indurated  Neurological:     Mental Status: He is alert.     Comments: Left foot  neurovascularly intact  Psychiatric:        Mood and Affect: Mood normal.        Thought Content: Thought content normal.        Judgment: Judgment normal.      UC Treatments / Results  Labs (all labs ordered are listed, but only abnormal results are displayed) Labs Reviewed - No data to display  EKG   Radiology No results found.  Procedures Procedures (including critical care time)  Medications Ordered in UC Medications - No data to display  Initial Impression / Assessment and Plan / UC Course  I have reviewed the triage vital signs and the nursing notes.  Pertinent labs & imaging results that were available during my care of the patient were reviewed by me and considered in my medical decision making (see chart for details).     Treat with Bactrim, Epsom salt soaks, good wound care at home and close podiatry follow-up.  ED for any worsening symptoms.  Small amount of Norco given for severe pain.  With precautions.  PDMP reviewed and appropriate  Final Clinical Impressions(s) / UC Diagnoses   Final diagnoses:  Localized swelling of left foot     Discharge Instructions      [facility] Address: [address], [place], [place] [address] Closed ? Opens 7:30?AM Fri Phone: [phone]    ED Prescriptions     Medication Sig Dispense Auth. Provider   sulfamethoxazole-trimethoprim (BACTRIM DS) 800-160 MG tablet Take 1 tablet by mouth 2 (two) times daily for 7 days. 14 tablet  Volney American, PA-C   HYDROcodone-acetaminophen (NORCO/VICODIN) 5-325 MG tablet Take 1 tablet by mouth 2 (two) times daily as needed for moderate pain. 10 tablet Volney American, [place]      I have reviewed the PDMP during this encounter.   Volney American, [place] 08/01/22 1605

## 2022-08-02 ENCOUNTER — Other Ambulatory Visit: Payer: Self-pay | Admitting: Cardiology

## 2022-08-03 ENCOUNTER — Other Ambulatory Visit (HOSPITAL_COMMUNITY): Payer: Self-pay

## 2022-08-03 MED ORDER — CARVEDILOL 12.5 MG PO TABS
ORAL_TABLET | ORAL | 3 refills | Status: DC
  Filled 2022-08-03: qty 180, 90d supply, fill #0
  Filled 2022-11-05: qty 180, 90d supply, fill #1
  Filled 2023-02-06: qty 180, 90d supply, fill #2
  Filled 2023-05-09: qty 180, 90d supply, fill #3

## 2022-08-08 DIAGNOSIS — S9032XA Contusion of left foot, initial encounter: Secondary | ICD-10-CM | POA: Diagnosis not present

## 2022-08-08 DIAGNOSIS — M79672 Pain in left foot: Secondary | ICD-10-CM | POA: Diagnosis not present

## 2022-08-22 DIAGNOSIS — S9032XD Contusion of left foot, subsequent encounter: Secondary | ICD-10-CM | POA: Diagnosis not present

## 2022-08-28 ENCOUNTER — Encounter: Payer: Self-pay | Admitting: Cardiology

## 2022-09-08 DIAGNOSIS — Z9861 Coronary angioplasty status: Secondary | ICD-10-CM | POA: Diagnosis not present

## 2022-09-08 DIAGNOSIS — E7849 Other hyperlipidemia: Secondary | ICD-10-CM | POA: Diagnosis not present

## 2022-09-08 DIAGNOSIS — I2510 Atherosclerotic heart disease of native coronary artery without angina pectoris: Secondary | ICD-10-CM | POA: Diagnosis not present

## 2022-09-08 DIAGNOSIS — I25709 Atherosclerosis of coronary artery bypass graft(s), unspecified, with unspecified angina pectoris: Secondary | ICD-10-CM | POA: Diagnosis not present

## 2022-09-08 DIAGNOSIS — I2119 ST elevation (STEMI) myocardial infarction involving other coronary artery of inferior wall: Secondary | ICD-10-CM | POA: Diagnosis not present

## 2022-09-09 LAB — COMPREHENSIVE METABOLIC PANEL
ALT: 36 IU/L (ref 0–44)
AST: 21 IU/L (ref 0–40)
Albumin/Globulin Ratio: 1.7 (ref 1.2–2.2)
Albumin: 4.5 g/dL (ref 3.8–4.9)
Alkaline Phosphatase: 75 IU/L (ref 44–121)
BUN/Creatinine Ratio: 19 (ref 9–20)
BUN: 15 mg/dL (ref 6–24)
Bilirubin Total: 0.3 mg/dL (ref 0.0–1.2)
CO2: 25 mmol/L (ref 20–29)
Calcium: 9.3 mg/dL (ref 8.7–10.2)
Chloride: 103 mmol/L (ref 96–106)
Creatinine, Ser: 0.79 mg/dL (ref 0.76–1.27)
Globulin, Total: 2.7 g/dL (ref 1.5–4.5)
Glucose: 111 mg/dL — ABNORMAL HIGH (ref 70–99)
Potassium: 5 mmol/L (ref 3.5–5.2)
Sodium: 143 mmol/L (ref 134–144)
Total Protein: 7.2 g/dL (ref 6.0–8.5)
eGFR: 102 mL/min/{1.73_m2} (ref >59)

## 2022-09-09 LAB — CBC
Hematocrit: 42.8 % (ref 37.5–51.0)
Hemoglobin: 13.8 g/dL (ref 13.0–17.7)
MCH: 29.4 pg (ref 26.6–33.0)
MCHC: 32.2 g/dL (ref 31.5–35.7)
MCV: 91 fL (ref 79–97)
Platelets: 213 10*3/uL (ref 150–450)
RBC: 4.7 x10E6/uL (ref 4.14–5.80)
RDW: 12.2 % (ref 11.6–15.4)
WBC: 6.2 10*3/uL (ref 3.4–10.8)

## 2022-09-09 LAB — LIPID PANEL
Chol/HDL Ratio: 3.2 ratio (ref 0.0–5.0)
Cholesterol, Total: 105 mg/dL (ref 100–199)
HDL: 33 mg/dL — ABNORMAL LOW (ref >39)
LDL Chol Calc (NIH): 53 mg/dL (ref 0–99)
Triglycerides: 98 mg/dL (ref 0–149)
VLDL Cholesterol Cal: 19 mg/dL (ref 5–40)

## 2022-09-09 LAB — HEMOGLOBIN A1C
Est. average glucose Bld gHb Est-mCnc: 128 mg/dL
Hgb A1c MFr Bld: 6.1 % — ABNORMAL HIGH (ref 4.8–5.6)

## 2022-09-11 NOTE — Progress Notes (Unsigned)
Cardiology Clinic Note   Patient Name: LALO TROMP Date of Encounter: 09/13/2022  Primary Care Provider:  Pcp, No Primary Cardiologist:  Glenetta Hew, MD  Patient Profile    Wilmer Floor Edmundson 61 year old male presents to the clinic today for follow-up evaluation of his coronary artery disease and essential hypertension.  Past Medical History    Past Medical History:  Diagnosis Date   Acute ST elevation myocardial infarction (STEMI) of inferior wall ([place]) 05/07/2020   Likely culprit lesion was 95% thrombotic mRCA (in tandem with pRCA 60%) - Lesion #1 DES PCI; OM1 also had 95% lesion (porential culprit - Lesion #2 DES PCI); ~CTO of AVG Cx after OM1 (unable to cross). EF 45-50% - Mod LVEDP with severe systemic HTN.    CAD S/P DES PCI - pRCA, ostOM1. CTO of AVG Cx 05/08/2020    pRCA 60%& mRCA 95% (Resolute Onyx DES 4.0 mm x 34 mm -> postdilated from 5.2-4.6 mm), RPAV 40% -RPL 2; OM1 95% (Resolute Onyx DES 2.75 mm x15 mm-2.95 mm), OM1 60%, small caliber LP AV 65% and 99% granulated with no loss (likely CTO); p-mLAD 40%, mLAD 40%.   Hyperlipidemia due to dietary fat intake 05/08/2020   Morbid obesity ([place]) 05/08/2020   Past Surgical History:  Procedure Laterality Date   COLONOSCOPY WITH PROPOFOL N/A 10/26/2015   Procedure: COLONOSCOPY WITH PROPOFOL;  Surgeon: [name], MD;  Location: [facility];  Service: Endoscopy;  Laterality: N/A;  Cecum time in  0748  time out  0758  total time 10 minutes   CORONARY/GRAFT ACUTE MI REVASCULARIZATION N/A 05/07/2020   Procedure: Coronary/Graft Acute MI Revascularization;  Surgeon: Leonie Man, MD;  Location: [facility];; INF STEMI: Lesions #1: pRCA 60%& mRCA 95% (covering both: Resolute Onyx DES 4.0 mm x 34 mm -> post-dil from 5.2-4.6 mm); Lesion #2: OM1 95% (Resolute Onyx DES 2.75 mm x15 mm-2.95 mm),   ESOPHAGOGASTRODUODENOSCOPY N/A 10/09/2015   Procedure: ESOPHAGOGASTRODUODENOSCOPY (EGD);  Surgeon: [name], MD;  [facility];  Service: Endoscopy;  Laterality: N/A;   FOOT SURGERY Right    bone cutting   HEMORROIDECTOMY  2003   LEFT HEART CATH AND CORONARY ANGIOGRAPHY N/A 05/07/2020   Procedure: LEFT HEART CATH AND CORONARY ANGIOGRAPHY;  Surgeon: Leonie Man, MD;  [facility];;  pRCA 60%& mRCA 95% (Lesion #1 DES PCI), RPAV 40% -RPL 2; OM1 95% (Lesion #2 DES PCI), OM1 60%; small caliber LP AV 65% and 99% unable to cannulate (likely CTO); p-mLAD 40%, mLAD 40%. EF 45-50%. Mod elevated LVEDP   LIPOMA EXCISION Right 11/08/2013   Procedure: EXCISION NEOPLASM RIGHT ARM;  Surgeon: [name], MD;  Location: [facility];  Service: General;  Laterality: Right;   MASS EXCISION Right 11/08/2013   Procedure: EXCISION NEOPLASM SCROTUM;  Surgeon: [name], MD;  Location: [facility];  Service: General;  Laterality: Right;   TRANSTHORACIC ECHOCARDIOGRAM  05/07/2020   In setting of inferior STEMI: EF 45-50%.  Moderate HK of the mid-apical inferior and lateral wall.  Otherwise normal.   TRANSTHORACIC ECHOCARDIOGRAM  08/21/2020   EF 65-70%. Gr 2 DD. No RWMA. Mild Asc Ao dilation - 39 mm. Normal valves. Normal RV. Normal Atriae.    Allergies  No Known Allergies  History of Present Illness    Josyah Achor Pickering has a PMH of coronary artery disease status post inferior STEMI 5/21 with PCI to RCA and staged PCI of his OM1  and 99% likely CTO of PL AV.  He was noted to have ischemic cardiomyopathy with an EF of 45% post MI which resolved after revascularization.  Follow-up echocardiogram showed an EF of 65 to 70%.  His PMH also includes hypertension hyperlipidemia as well as morbid obesity.  He was seen in follow-up by Dr. Ellyn Hack on 02/23/2022.  During that time he denied chest discomfort.  He was unable to maintain his exercise regimen and noted regaining of weight.  His LDL was 53 5/22.  He was continued on atorvastatin 80 mg along ezetimibe.  His blood pressure was elevated and his losartan was increased to 100 mg  daily.  His carvedilol was continued.  He declined referral to community health and wellness for weight loss counseling.  He was encouraged to join [employer] type exercises.  He presents to the clinic today for follow-up evaluation states he is having right hip pain and recently had 3 teeth operated on.  He reports he had infection in the teeth.  He has been to orthopedics who recommended right hip replacement however, he is unable to have hip replacement because of his weight.  We discussed the importance of heart healthy diet and increasing fiber in his diet as well as increasing physical activity.  He is reluctant to increase physical activity at this time.  We discussed the option of nonweightbearing physical activity.  He indicates that he is doing well financially at this time and is more focused on getting ready to retire.  I challenged him to eat 3 foods per week, 4 vegetables per week and increase the fiber in his diet.  We reviewed his most recent lab work.  He and his wife expressed understanding.  We will plan follow-up for 6 months.  Today he denies chest pain, shortness of breath, lower extremity edema, fatigue, palpitations, melena, hematuria, hemoptysis, diaphoresis, weakness, presyncope, syncope, orthopnea, and PND.    Home Medications    Prior to Admission medications   Medication Sig Start Date End Date Taking? Authorizing Provider  atorvastatin (LIPITOR) 80 MG tablet TAKE 1 TABLET BY MOUTH ONCE A DAY 04/25/22 04/25/23  Leonie Man, MD  carvedilol (COREG) 12.5 MG tablet TAKE 1 TABLET BY MOUTH 2 TIMES DAILY WITH A MEAL 08/03/22 08/03/23  Leonie Man, MD  Cholecalciferol (VITAMIN D3) 25 MCG (1000 UT) CAPS Take by mouth.    [provider]  diphenhydramine-acetaminophen (TYLENOL PM) 25-500 MG TABS tablet Take 1 tablet by mouth at bedtime as needed.    [provider]  ezetimibe (ZETIA) 10 MG tablet TAKE 1 TABLET BY MOUTH DAILY. 01/10/22 01/10/23  Leonie Man, MD  furosemide (LASIX) 40 MG tablet Take 1 tablet (40 mg total) by mouth daily as needed for swelling 05/26/21 04/11/22  Kroeger, Lorelee Cover., PA-C  HYDROcodone-acetaminophen (NORCO/VICODIN) 5-325 MG tablet Take 1 tablet by mouth 2 (two) times daily as needed for moderate pain. 07/28/22   Volney American, PA-C  losartan (COZAAR) 100 MG tablet Take 1 tablet (100 mg total) by mouth daily. 02/23/22 09/28/22  Leonie Man, MD  nitroGLYCERIN (NITROSTAT) 0.4 MG SL tablet Place 1 tablet (0.4 mg total) under the tongue every 5 (five) minutes x 3 doses as needed for chest pain. 05/09/20   Kathyrn Drown D, NP  ticagrelor (BRILINTA) 60 MG TABS tablet Take 1 tablet (60 mg total) by mouth 2 (two) times daily. 06/13/22   Leonie Man, MD  vitamin C (ASCORBIC ACID) 500 MG  tablet Take 2,000 mg by mouth daily.     [provider]    Family History    Family History  Problem Relation Age of Onset   Healthy Mother    Heart attack Maternal Grandfather 53   Parkinson's disease Paternal Grandfather 83   Heart attack Maternal Uncle    Heart attack Paternal Uncle 68   He indicated that his mother is alive. He indicated that his father is deceased. He indicated that his maternal grandfather is deceased. He indicated that his paternal grandfather is deceased. He indicated that his maternal uncle is deceased. He indicated that his paternal uncle is deceased.  Social History    Social History   Socioeconomic History   Marital status: Married    Spouse name: Not on file   Number of children: Not on file   Years of education: Not on file   Highest education level: Not on file  Occupational History    Employer: [employer]  Tobacco Use   Smoking status: Former    Packs/day: 1.00    Years: 5.00    Total pack years: 5.00    Types: Cigarettes   Smokeless tobacco: Never  Substance and Sexual Activity   Alcohol use: Yes    Alcohol/week: 0.0 standard drinks of alcohol    Comment:  rarely   Drug use: Yes    Types: Marijuana    Comment: 10/18/15   Sexual activity: Yes    Birth control/protection: None  Other Topics Concern   Not on file  Social History Narrative   He lives with his wife.   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   Social Determinants of Health   Financial Resource Strain: Not on file  Food Insecurity: Not on file  Transportation Needs: Not on file  Physical Activity: Not on file  Stress: Not on file  Social Connections: Not on file  Intimate Partner Violence: Not on file     Review of Systems    General:  No chills, fever, night sweats or weight changes.  Cardiovascular:  No chest pain, dyspnea on exertion, edema, orthopnea, palpitations, paroxysmal nocturnal dyspnea. Dermatological: No rash, lesions/masses Respiratory: No cough, dyspnea Urologic: No hematuria, dysuria Abdominal:   No nausea, vomiting, diarrhea, bright red blood per rectum, melena, or hematemesis Neurologic:  No visual changes, wkns, changes in mental status. All other systems reviewed and are otherwise negative except as noted above.  Physical Exam    VS:  BP 136/80   Pulse 71   Ht '5\' 10"'$  (1.778 m)   Wt (!) 318 lb (144.2 kg)   SpO2 91%   BMI 45.63 kg/m  , BMI Body mass index is 45.63 kg/m. GEN: Well nourished, well developed, in no acute distress. HEENT: normal. Neck: Supple, no JVD, carotid bruits, or masses. Cardiac: RRR, no murmurs, rubs, or gallops. No clubbing, cyanosis, edema.  Radials/DP/PT 2+ and equal bilaterally.  Respiratory:  Respirations regular and unlabored, clear to auscultation bilaterally. GI: Soft, nontender, nondistended, BS + x 4. MS: no deformity or atrophy. Skin: warm and dry, no rash. Neuro:  Strength and sensation are intact. Psych: Normal affect.  Accessory Clinical Findings    Recent Labs: 09/08/2022: ALT 36; BUN 15; Creatinine, Ser 0.79; Hemoglobin 13.8; Platelets 213; Potassium 5.0; Sodium 143    Recent Lipid Panel    Component Value Date/Time   CHOL 105 09/08/2022 0809   TRIG 98 09/08/2022 0809   HDL 33 (L) 09/08/2022  0809   CHOLHDL 3.2 09/08/2022 0809   CHOLHDL 6.2 05/07/2020 0821   VLDL 22 05/07/2020 0821   LDLCALC 53 09/08/2022 0809         ECG personally reviewed by me today-normal sinus rhythm no ectopy 71 bpm- No acute changes  Echocardiogram 08/21/2020  IMPRESSIONS     1. Left ventricular ejection fraction, by estimation, is 65 to 70%. The  left ventricle has normal function. The left ventricle has no regional  wall motion abnormalities. Left ventricular diastolic parameters are  consistent with Grade II diastolic  dysfunction (pseudonormalization).   2. Right ventricular systolic function is normal. The right ventricular  size is normal. There is normal pulmonary artery systolic pressure.   3. The mitral valve is normal in structure. No evidence of mitral valve  regurgitation. No evidence of mitral stenosis.   4. The aortic valve is tricuspid. Aortic valve regurgitation is not  visualized. No aortic stenosis is present.   5. Aortic dilatation noted. There is mild dilatation at the level of the  sinuses of Valsalva measuring 39 mm.   6. The inferior vena cava is normal in size with greater than 50%  respiratory variability, suggesting right atrial pressure of 3 mmHg.  Cardiac catheterization 05/07/2020 There is no aortic valve stenosis. CULPRIT LESION #1 prox RCA-1 lesion is 60% stenosed. Prox RCA-2 lesion is 95% stenosed. A drug-eluting stent was successfully placed covering both lesions using a STENT RESOLUTE ONYX 4.0X34. Postdilated and taper fashion from 5.2-4.6 mm Post intervention, there is a 0% residual stenosis. RPAV lesion is 40% stenosed with 40% stenosed side branch in 2nd RPL. --- CULPRIT LESION #2 1st Mrg lesion is 95% stenosed. A drug-eluting stent was successfully placed using a STENT RESOLUTE ONYX 2.75 X 15 -> postdilated to 2.95  mm Post intervention, there is a 0% residual stenosis. -- Lat 1st Mrg lesion is 60% stenosed. LPAV-1 lesion is 65% stenosed. LPAV-2 lesion is 99% stenosed. -> Unable to cross with wire, likely currently occluded --- Prox LAD to Mid LAD lesion is 40% stenosed. Mid LAD lesion is 40% stenosed. --- There is mild left ventricular systolic dysfunction. The left ventricular ejection fraction is 45-50% by visual estimate. LV end diastolic pressure is moderately elevated. Prox RCA-2 lesion is 95% stenosed.   SUMMARY SEVERE THREE-VESSEL DISEASE with proximal RCA 60% followed by mid RCA 95% thrombotic subtotal occlusion (culprit lesion), 99% thrombotic stenosis of large bifurcating OM1 and 70 to 100% likely CTO occlusion of AV groove circumflex (unable to cross). Successful DES PCI of proximal to mid RCA using Resolute Onyx 4.0 mm x 34 mm postdilated to  5.2 mm proximal and 4.6 mm distal Successful DES PCI of ostial OM1 with Resolute Onyx DES 2.75 mm x15 mm postdilated to 2.95 mm Poor visualization on left ventriculogram.  Would recommend echocardiogram.  Could not evaluate wall motion, however EF did not appear to be decreased. Moderately elevated LVEDP. Severe systemic hypertension.     RECOMMENDATIONS Admit to CCU for ongoing care, or on Aggrastat until current bottle complete. Aggressive risk factor modification-we will check lipid panel start atorvastatin 80 mg daily Start carvedilol titrate accordingly blood pressure..  Medications ordered. Smoking cessation counseling 2D echo ordered With two-vessel PCI large territory involved, likely not a candidate for fast-track discharge.       Glenetta Hew, MD   Diagnostic Dominance: Right  Intervention    Assessment & Plan   1.  Coronary artery disease-no chest pain today.  Underwent staged PCI  with DES to his RCA and OM1.  CTO of circumflex.   Continue Brilinta 60 mg twice daily, losartan, atorvastatin, ezetimibe Increase fruits and  vegetables in diet Reduce calories in diet  Hyperlipidemia-LDL 53 on 09/08/22 Continue atorvastatin, ezetimibe Heart healthy low-sodium diet-salty 6 given Increase physical activity as tolerated  Essential hypertension-BP today 136/80 Continue coreg,losartan Heart healthy low-sodium diet-salty 6 given Increase physical activity as tolerated  Tobacco abuse-has continued to refrain from smoking. Congratulated on smoking cessation  Morbid obesity-weight today 318.  Has increased physical activity.  Focused on weight loss. Continue increase physical activity Continue weight loss Increase fruits and vegetables in diet Reduce calories in diet  Disposition: Follow-up with Dr. Ellyn Hack in 6 months.   Jossie Ng. Danean Marner NP-C     09/13/2022, 2:55 PM [address] Office [phone] Fax [phone]  Notice: This dictation was prepared with Dragon dictation along with smaller phrase technology. Any transcriptional errors that result from this process are unintentional and may not be corrected upon review.  I spent 14 minutes examining this patient, reviewing medications, and using patient centered shared decision making involving her cardiac care.  Prior to her visit I spent greater than 20 minutes reviewing her past medical history,  medications, and prior cardiac tests.

## 2022-09-12 ENCOUNTER — Other Ambulatory Visit (HOSPITAL_COMMUNITY): Payer: Self-pay

## 2022-09-13 ENCOUNTER — Ambulatory Visit: Payer: 59 | Attending: General Practice | Admitting: General Practice

## 2022-09-13 ENCOUNTER — Encounter: Payer: Self-pay | Admitting: General Practice

## 2022-09-13 ENCOUNTER — Other Ambulatory Visit (HOSPITAL_COMMUNITY): Payer: Self-pay

## 2022-09-13 VITALS — BP 136/80 | HR 71 | Ht 70.0 in | Wt 318.0 lb

## 2022-09-13 DIAGNOSIS — E7849 Other hyperlipidemia: Secondary | ICD-10-CM

## 2022-09-13 DIAGNOSIS — I2510 Atherosclerotic heart disease of native coronary artery without angina pectoris: Secondary | ICD-10-CM

## 2022-09-13 DIAGNOSIS — I10 Essential (primary) hypertension: Secondary | ICD-10-CM | POA: Diagnosis not present

## 2022-09-13 DIAGNOSIS — Z87891 Personal history of nicotine dependence: Secondary | ICD-10-CM | POA: Diagnosis not present

## 2022-09-13 MED ORDER — NITROGLYCERIN 0.4 MG SL SUBL
0.4000 mg | SUBLINGUAL_TABLET | SUBLINGUAL | 1 refills | Status: AC | PRN
  Filled 2022-09-13: qty 25, 8d supply, fill #0

## 2022-09-13 NOTE — Patient Instructions (Signed)
Medication Instructions:  The current medical regimen is effective;  continue present plan and medications as directed. Please refer to the Current Medication list given to you today.  *If you need a refill on your cardiac medications before your next appointment, please call your pharmacy*   Lab Work: NONE If you have labs (blood work) drawn today and your tests are completely normal, you will receive your results only by:  [place] (if you have MyChart) OR A paper copy in the mail If you have any lab test that is abnormal or we need to change your treatment, we will call you to review the results.  Follow-Up: At [facility], you and your health needs are our priority.  As part of our continuing mission to provide you with exceptional heart care, we have created designated Provider Care Teams.  These Care Teams include your primary Cardiologist (physician) and Advanced Practice Providers (APPs -  Physician Assistants and Nurse Practitioners) who all work together to provide you with the care you need, when you need it.  We recommend signing up for the patient portal called "MyChart".  Sign up information is provided on this After Visit Summary.  MyChart is used to connect with patients for Virtual Visits (Telemedicine).  Patients are able to view lab/test results, encounter notes, upcoming appointments, etc.  Non-urgent messages can be sent to your provider as well.   To learn more about what you can do with MyChart, go to [email or web address].    Your next appointment:  Your next appointment:    Provider:   6 month(s)           In Person      Glenetta Hew, MD     Other Instructions MAKE SURE TO EAT 3 VEGGIES/WEEK AND 4 FRUITS/WEEK  PLEASE FOLLOW DECREASED CALORIE DIET  INCREASE PHYSICAL ACTIVITY AS TOLERATED  Important Information About Sugar

## 2022-09-14 ENCOUNTER — Other Ambulatory Visit (HOSPITAL_COMMUNITY): Payer: Self-pay

## 2022-09-29 ENCOUNTER — Other Ambulatory Visit (HOSPITAL_COMMUNITY): Payer: Self-pay

## 2022-10-11 ENCOUNTER — Other Ambulatory Visit (HOSPITAL_COMMUNITY): Payer: Self-pay

## 2022-10-24 ENCOUNTER — Other Ambulatory Visit: Payer: Self-pay | Admitting: Cardiology

## 2022-10-25 ENCOUNTER — Other Ambulatory Visit (HOSPITAL_COMMUNITY): Payer: Self-pay

## 2022-10-25 MED ORDER — ATORVASTATIN CALCIUM 80 MG PO TABS
80.0000 mg | ORAL_TABLET | Freq: Every day | ORAL | 1 refills | Status: DC
  Filled 2022-10-25: qty 90, 90d supply, fill #0
  Filled 2023-01-18: qty 90, 90d supply, fill #1

## 2022-11-05 ENCOUNTER — Other Ambulatory Visit (HOSPITAL_COMMUNITY): Payer: Self-pay

## 2022-11-10 ENCOUNTER — Other Ambulatory Visit (HOSPITAL_COMMUNITY): Payer: Self-pay

## 2022-12-15 ENCOUNTER — Other Ambulatory Visit (HOSPITAL_COMMUNITY): Payer: Self-pay

## 2022-12-23 ENCOUNTER — Other Ambulatory Visit: Payer: Self-pay

## 2022-12-23 ENCOUNTER — Other Ambulatory Visit (HOSPITAL_COMMUNITY): Payer: Self-pay

## 2023-01-10 ENCOUNTER — Other Ambulatory Visit: Payer: Self-pay | Admitting: Cardiology

## 2023-01-11 ENCOUNTER — Other Ambulatory Visit (HOSPITAL_COMMUNITY): Payer: Self-pay

## 2023-01-11 ENCOUNTER — Encounter (HOSPITAL_COMMUNITY): Payer: Self-pay

## 2023-01-11 MED ORDER — EZETIMIBE 10 MG PO TABS
10.0000 mg | ORAL_TABLET | Freq: Every day | ORAL | 3 refills | Status: DC
  Filled 2023-01-11 – 2023-01-18 (×2): qty 90, 90d supply, fill #0
  Filled 2023-04-06: qty 90, 90d supply, fill #1
  Filled 2023-07-04: qty 90, 90d supply, fill #2
  Filled 2023-10-09: qty 90, 90d supply, fill #3

## 2023-01-17 ENCOUNTER — Other Ambulatory Visit: Payer: Self-pay

## 2023-01-18 ENCOUNTER — Other Ambulatory Visit (HOSPITAL_COMMUNITY): Payer: Self-pay

## 2023-02-06 ENCOUNTER — Other Ambulatory Visit (HOSPITAL_COMMUNITY): Payer: Self-pay

## 2023-02-10 ENCOUNTER — Other Ambulatory Visit (HOSPITAL_COMMUNITY): Payer: Self-pay

## 2023-02-10 ENCOUNTER — Other Ambulatory Visit: Payer: Self-pay

## 2023-02-23 ENCOUNTER — Encounter: Payer: Self-pay | Admitting: Radiology

## 2023-03-06 ENCOUNTER — Ambulatory Visit (INDEPENDENT_AMBULATORY_CARE_PROVIDER_SITE_OTHER): Payer: 59

## 2023-03-06 ENCOUNTER — Ambulatory Visit (INDEPENDENT_AMBULATORY_CARE_PROVIDER_SITE_OTHER): Payer: 59 | Admitting: Orthopedic Surgery

## 2023-03-06 ENCOUNTER — Encounter: Payer: Self-pay | Admitting: Orthopedic Surgery

## 2023-03-06 VITALS — Ht 70.0 in | Wt 358.0 lb

## 2023-03-06 DIAGNOSIS — M1612 Unilateral primary osteoarthritis, left hip: Secondary | ICD-10-CM | POA: Diagnosis not present

## 2023-03-06 DIAGNOSIS — M25551 Pain in right hip: Secondary | ICD-10-CM

## 2023-03-06 DIAGNOSIS — M1611 Unilateral primary osteoarthritis, right hip: Secondary | ICD-10-CM

## 2023-03-06 NOTE — Progress Notes (Signed)
Chief Complaint  Patient presents with   Hip Pain    Right     62 years old he has a BMI of 49.19 he has coronary artery disease with a previous heart attack.  He comes in with posterior right hip pain in his right buttock which he feels is hip arthritis.  He has trouble raising his leg when he is going up and down the steps he has some pain which has not responded well to Tylenol including extra strength.  He has had pain now for several years noting more pain after he is gaining weight    Mr. Madl is now 62 years old he said he is gained some more weight after losing some.  Comes in with similar pain in the right buttock.  It seems to be getting worse.  He is considering a new weight loss program as well.  Physical Exam Vitals and nursing note reviewed.  Constitutional:      Appearance: Normal appearance.  HENT:     Head: Normocephalic and atraumatic.  Eyes:     General: No scleral icterus.       Right eye: No discharge.        Left eye: No discharge.     Extraocular Movements: Extraocular movements intact.     Conjunctiva/sclera: Conjunctivae normal.     Pupils: Pupils are equal, round, and reactive to light.  Cardiovascular:     Rate and Rhythm: Normal rate.     Pulses: Normal pulses.  Skin:    General: Skin is warm and dry.     Capillary Refill: Capillary refill takes less than 2 seconds.  Neurological:     General: No focal deficit present.     Mental Status: He is alert and oriented to person, place, and time.  Psychiatric:        Mood and Affect: Mood normal.        Behavior: Behavior normal.        Thought Content: Thought content normal.        Judgment: Judgment normal.    His left hip has free and easy range of motion with no pain  His left his right hip has severe loss of internal rotation with only internal rotation to neutral position external rotation is 35 degrees hip flexion is restricted by pain at 110 degrees I can push it to 120 degrees  He ambulates  with a waddling gait  We took new x-rays and compared them to the previous film he still has grade 4 arthritis he has not progressed as much as I thought he would he has arthritis on the left side as well just not as bad he has a large cyst in the femoral head and matching cyst on each side of the joint  Encounter Diagnoses  Name Primary?   Pain in right hip    Arthritis of right hip Yes   Plan  I offered him a pain management consult he says he would rather try to lose weight and do exercise instead he may call us back to get a referral  He has severe coronary artery disease his weight is precluding him from getting any type of hip surgery at this time

## 2023-03-17 ENCOUNTER — Ambulatory Visit: Payer: 59 | Attending: Cardiology | Admitting: Cardiology

## 2023-03-17 ENCOUNTER — Encounter: Payer: Self-pay | Admitting: Cardiology

## 2023-03-17 VITALS — BP 142/80 | HR 76 | Ht 70.5 in | Wt 363.3 lb

## 2023-03-17 DIAGNOSIS — I5032 Chronic diastolic (congestive) heart failure: Secondary | ICD-10-CM | POA: Diagnosis not present

## 2023-03-17 DIAGNOSIS — I2119 ST elevation (STEMI) myocardial infarction involving other coronary artery of inferior wall: Secondary | ICD-10-CM

## 2023-03-17 DIAGNOSIS — I10 Essential (primary) hypertension: Secondary | ICD-10-CM

## 2023-03-17 DIAGNOSIS — I2510 Atherosclerotic heart disease of native coronary artery without angina pectoris: Secondary | ICD-10-CM | POA: Diagnosis not present

## 2023-03-17 DIAGNOSIS — E7849 Other hyperlipidemia: Secondary | ICD-10-CM | POA: Diagnosis not present

## 2023-03-17 DIAGNOSIS — Z9861 Coronary angioplasty status: Secondary | ICD-10-CM

## 2023-03-17 NOTE — Patient Instructions (Addendum)
Medication Instructions:   Not needed *If you need a refill on your cardiac medications before your next appointment, please call your pharmacy*   Lab Work: in 3 months fasting LIPID CMP HGBA1c If you have labs (blood work) drawn today and your tests are completely normal, you will receive your results only by: [place] (if you have MyChart) OR A paper copy in the mail If you have any lab test that is abnormal or we need to change your treatment, we will call you to review the results.   Testing/Procedures: Not  needed   Follow-Up: At [facility], you and your health needs are our priority.  As part of our continuing mission to provide you with exceptional heart care, we have created designated Provider Care Teams.  These Care Teams include your primary Cardiologist (physician) and Advanced Practice Providers (APPs -  Physician Assistants and Nurse Practitioners) who all work together to provide you with the care you need, when you need it.     Your next appointment:   6 month(s)  The format for your next appointment:   In Person  Provider:   Coletta Memos, FNP then, Glenetta Hew, MD will plan to see you again in 12 month(s). You have been referred  to CVRR- discuss GP!- Agon. medication  Other Instructions    You goal is work your  weight  by change diet habits and exercising more

## 2023-03-17 NOTE — Progress Notes (Unsigned)
Primary Care Provider: Pcp, No O'Brien Cardiologist: Gordon Hew, MD Electrophysiologist: None  Clinic Note: No chief complaint on file.   ===================================  ASSESSMENT/PLAN   Problem List Items Addressed This Visit   None   ===================================  HPI:    Gordon Richards is a 62 y.o. male with a PMH below who presents today for ***.  Gordon Richards was last seen on ***  coronary artery disease status post inferior STEMI 5/21 with PCI to RCA and staged PCI of his OM1 and 99% likely CTO of PL AV.  He was noted to have ischemic cardiomyopathy with an EF of 45% post MI which resolved after revascularization.  Follow-up echocardiogram showed an EF of 65 to 70%.  His PMH also includes hypertension hyperlipidemia as well as morbid obesity.   He was seen in follow-up by Dr. Ellyn Hack on 02/23/2022.  During that time he denied chest discomfort.  He was unable to maintain his exercise regimen and noted regaining of weight.  His LDL was 53 5/22.  He was continued on atorvastatin 80 mg along ezetimibe.  His blood pressure was elevated and his losartan was increased to 100 mg daily.  His carvedilol was continued.  He declined referral to community health and wellness for weight loss counseling.  He was encouraged to join [employer] type exercises.   He presents to the clinic today 09/13/2022 Gordon Sera, NP) for follow-up evaluation states he is having right hip pain and recently had 3 teeth operated on.  He reports he had infection in the teeth.  He has been to orthopedics who recommended right hip replacement however, he is unable to have hip replacement because of his weight.  We discussed the importance of heart healthy diet and increasing fiber in his diet as well as increasing physical activity.  He is reluctant to increase physical activity at this time.  We discussed the option of nonweightbearing physical activity.  He indicates that he  is doing well financially at this time and is more focused on getting ready to retire.  I challenged him to eat 3 foods per week, 4 vegetables per week and increase the fiber in his diet.  We reviewed his most recent lab work.  He and his wife expressed understanding.  We will plan follow-up for 6 months.   Today he denies chest pain, shortness of breath, lower extremity edema, fatigue, palpitations, melena, hematuria, hemoptysis, diaphoresis, weakness, presyncope, syncope, orthopnea, and PND.    Coronary artery disease-no chest pain today.  Underwent staged PCI with DES to his RCA and OM1.  CTO of circumflex.   Continue Brilinta 60 mg twice daily, losartan, atorvastatin, ezetimibe Increase fruits and vegetables in diet Reduce calories in diet   Hyperlipidemia-LDL 53 on 09/08/22 Continue atorvastatin, ezetimibe Heart healthy low-sodium diet-salty 6 given Increase physical activity as tolerated   Essential hypertension-BP today 136/80 Continue coreg,losartan Heart healthy low-sodium diet-salty 6 given Increase physical activity as tolerated   Tobacco abuse-has continued to refrain from smoking. Congratulated on smoking cessation   Morbid obesity-weight today 318.  Has increased physical activity.  Focused on weight loss. Continue increase physical activity Continue weight loss Increase fruits and vegetables in diet Reduce calories in diet  Recent Hospitalizations: ***  Reviewed  CV studies:    The following studies were reviewed today: (if available, images/films reviewed: From Epic Chart or Care Everywhere) ***:  Interval History:   Gordon Richards   CV Review of Symptoms (  Summary): Cardiovascular ROS: {roscv:310661}  REVIEWED OF SYSTEMS   ROS  I have reviewed and (if needed) personally updated the patient's problem list, medications, allergies, past medical and surgical history, social and family history.   PAST MEDICAL HISTORY   Past Medical History:  Diagnosis Date    Acute ST elevation myocardial infarction (STEMI) of inferior wall ([place]) 05/07/2020   Likely culprit lesion was 95% thrombotic mRCA (in tandem with pRCA 60%) - Lesion #1 DES PCI; OM1 also had 95% lesion (porential culprit - Lesion #2 DES PCI); ~CTO of AVG Cx after OM1 (unable to cross). EF 45-50% - Mod LVEDP with severe systemic HTN.    CAD S/P DES PCI - pRCA, ostOM1. CTO of AVG Cx 05/08/2020    pRCA 60%& mRCA 95% (Resolute Onyx DES 4.0 mm x 34 mm -> postdilated from 5.2-4.6 mm), RPAV 40% -RPL 2; OM1 95% (Resolute Onyx DES 2.75 mm x15 mm-2.95 mm), OM1 60%, small caliber LP AV 65% and 99% granulated with no loss (likely CTO); p-mLAD 40%, mLAD 40%.   Hyperlipidemia due to dietary fat intake 05/08/2020   Morbid obesity ([place]) 05/08/2020    PAST SURGICAL HISTORY   Past Surgical History:  Procedure Laterality Date   COLONOSCOPY WITH PROPOFOL N/A 10/26/2015   Procedure: COLONOSCOPY WITH PROPOFOL;  Surgeon: [name], MD;  Location: [facility];  Service: Endoscopy;  Laterality: N/A;  [place] time in  0748  time out  0758  total time 10 minutes   CORONARY/GRAFT ACUTE MI REVASCULARIZATION N/A 05/07/2020   Procedure: Coronary/Graft Acute MI Revascularization;  Surgeon: Leonie Man, MD;  Location: [place] [facility];; INF STEMI: Lesions #1: pRCA 60%& mRCA 95% (covering both: Resolute Onyx DES 4.0 mm x 34 mm -> post-dil from 5.2-4.6 mm); Lesion #2: OM1 95% (Resolute Onyx DES 2.75 mm x15 mm-2.95 mm),   ESOPHAGOGASTRODUODENOSCOPY N/A 10/09/2015   Procedure: ESOPHAGOGASTRODUODENOSCOPY (EGD);  Surgeon: [name], MD;  Location: [facility];  Service: Endoscopy;  Laterality: N/A;   FOOT SURGERY Right    bone cutting   HEMORROIDECTOMY  2003   LEFT HEART CATH AND CORONARY ANGIOGRAPHY N/A 05/07/2020   Procedure: LEFT HEART CATH AND CORONARY ANGIOGRAPHY;  Surgeon: Leonie Man, MD;  Location: [facility];;  pRCA 60%& mRCA 95% (Lesion #1 DES PCI), RPAV 40% -RPL 2; OM1 95% (Lesion #2 DES PCI),  OM1 60%; small caliber LP AV 65% and 99% unable to cannulate (likely CTO); p-mLAD 40%, mLAD 40%. EF 45-50%. Mod elevated LVEDP   LIPOMA EXCISION Right 11/08/2013   Procedure: EXCISION NEOPLASM RIGHT ARM;  Surgeon: [name], MD;  Location: [facility];  Service: General;  Laterality: Right;   MASS EXCISION Right 11/08/2013   Procedure: EXCISION NEOPLASM SCROTUM;  Surgeon: [name], MD;  Location: [facility];  Service: General;  Laterality: Right;   TRANSTHORACIC ECHOCARDIOGRAM  05/07/2020   In setting of inferior STEMI: EF 45-50%.  Moderate HK of the mid-apical inferior and lateral wall.  Otherwise normal.   TRANSTHORACIC ECHOCARDIOGRAM  08/21/2020   EF 65-70%. Gr 2 DD. No RWMA. Mild Asc Ao dilation - 39 mm. Normal valves. Normal RV. Normal Atriae.   Diagnostic Dominance: Right    Intervention     There is no immunization history on file for this patient.  MEDICATIONS/ALLERGIES   Current Meds  Medication Sig   aspirin EC 81 MG tablet Take 81 mg by mouth daily. Swallow whole.   atorvastatin (LIPITOR) 80 MG tablet Take  1 tablet (80 mg total) by mouth daily.   carvedilol (COREG) 12.5 MG tablet TAKE 1 TABLET BY MOUTH 2 TIMES DAILY WITH A MEAL   Cholecalciferol (VITAMIN D3) 25 MCG (1000 UT) CAPS Take by mouth.   diphenhydramine-acetaminophen (TYLENOL PM) 25-500 MG TABS tablet Take 1 tablet by mouth at bedtime as needed.   ezetimibe (ZETIA) 10 MG tablet Take 1 tablet (10 mg total) by mouth daily.   losartan (COZAAR) 100 MG tablet Take 1 tablet (100 mg total) by mouth daily.   nitroGLYCERIN (NITROSTAT) 0.4 MG SL tablet Place 1 tablet (0.4 mg total) under the tongue every 5 (five) minutes x 3 doses as needed for chest pain.   ticagrelor (BRILINTA) 60 MG TABS tablet Take 1 tablet (60 mg total) by mouth 2 (two) times daily.   vitamin C (ASCORBIC ACID) 500 MG tablet Take 2,000 mg by mouth daily.     No Known Allergies  SOCIAL HISTORY/FAMILY HISTORY   Reviewed in Epic:  Pertinent findings:   Social History   Tobacco Use   Smoking status: Former    Packs/day: 1.00    Years: 5.00    Additional pack years: 0.00    Total pack years: 5.00    Types: Cigarettes   Smokeless tobacco: Never  Substance Use Topics   Alcohol use: Yes    Alcohol/week: 0.0 standard drinks of alcohol    Comment: rarely   Drug use: Yes    Types: Marijuana    Comment: 10/18/15   Social History   Social History Narrative   He lives with his wife.   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    OBJCTIVE -PE, EKG, labs   Wt Readings from Last 3 Encounters:  03/17/23 (!) 363 lb 4.8 oz (164.8 kg)  03/06/23 (!) 358 lb (162.4 kg)  09/13/22 (!) 318 lb (144.2 kg)    Physical Exam: BP (!) 142/80 (BP Location: Left Arm, Patient Position: Sitting, Cuff Size: Large)   Pulse 76   Ht 5' 10.5" (1.791 m)   Wt (!) 363 lb 4.8 oz (164.8 kg)   SpO2 98%   BMI 51.39 kg/m  Physical Exam   Adult ECG Report  Rate: *** ;  Rhythm: {rhythm:17366};   Narrative Interpretation: ***  Recent Labs:  ***  Lab Results  Component Value Date   CHOL 105 09/08/2022   HDL 33 (L) 09/08/2022   LDLCALC 53 09/08/2022   TRIG 98 09/08/2022   CHOLHDL 3.2 09/08/2022   Lab Results  Component Value Date   CREATININE 0.79 09/08/2022   BUN 15 09/08/2022   NA 143 09/08/2022   K 5.0 09/08/2022   CL 103 09/08/2022   CO2 25 09/08/2022      Latest Ref Rng & Units 09/08/2022    8:09 AM 05/08/2020    2:34 AM 05/07/2020    8:21 AM  CBC  WBC 3.4 - 10.8 x10E3/uL 6.2  12.0  11.9   Hemoglobin 13.0 - 17.7 g/dL 13.8  15.2  16.2   Hematocrit 37.5 - 51.0 % 42.8  46.7  48.4   Platelets 150 - 450 x10E3/uL 213  223  237     Lab Results  Component Value Date   HGBA1C 6.1 (H) 09/08/2022   Lab Results  Component Value Date   TSH 2.520 05/07/2020    ================================================== I spent a total of ***minutes with the patient spent in direct patient consultation.  Additional time spent  with chart review  /  charting (studies, outside notes, etc): *** min Total Time: *** min  Current medicines are reviewed at length with the patient today.  (+/- concerns) ***  Notice: This dictation was prepared with Dragon dictation along with smart phrase technology. Any transcriptional errors that result from this process are unintentional and may not be corrected upon review.  Studies Ordered:   No orders of the defined types were placed in this encounter.  No orders of the defined types were placed in this encounter.   Patient Instructions / Medication Changes & Studies & Tests Ordered   There are no Patient Instructions on file for this visit.     Leonie Man, MD, MS Gordon Richards, M.D., M.S. Interventional Cardiologist  [place]  Pager # [phone] Phone # [phone] [address]. [place], [place] [address]   Thank you for choosing [facility]!!

## 2023-03-18 ENCOUNTER — Encounter: Payer: Self-pay | Admitting: Cardiology

## 2023-03-18 DIAGNOSIS — I5032 Chronic diastolic (congestive) heart failure: Secondary | ICD-10-CM | POA: Insufficient documentation

## 2023-03-18 NOTE — Assessment & Plan Note (Signed)
Significant multivessel disease with stents placed in the RCA and OM.  He has a basely subtotally occluded AV groove LCx that was too small to intervene on.  Moderate disease elsewhere.  Lipids have been well-controlled on current dose of atorvastatin and Zetia. Is on stable dose of carvedilol and losartan  I actually think he would benefit from GLP-1 agonist even though A1c was only 6.1, he does have CAD and is morbidly obese.  => Will refer to CV[facility] Lipid Clinic/Risk Reduction Clinic to discuss initiating GLP-1 agonist such as Wegovy or Zepbound

## 2023-03-18 NOTE — Assessment & Plan Note (Signed)
3 years out from MI with two-vessel PCI.  EF improved post PCI on good medications.  No active cardiac symptoms.  No recurrent angina.

## 2023-03-18 NOTE — Assessment & Plan Note (Signed)
Blood pressure is borderline elevated today => may need to consider increasing carvedilol dose versus starting spironolactone or chlorthalidone if still elevated.  Is already on high-dose losartan.

## 2023-03-18 NOTE — Assessment & Plan Note (Signed)
I think his dyspnea is more related to obesity and deconditioning.  He is not very active.  Uses very very sparing doses of Lasix.  Is on stable dose of ARB and beta-blocker.

## 2023-03-18 NOTE — Assessment & Plan Note (Addendum)
Labs look great as of September 2023.  Stable LDL at 53.  Triglycerides also look pretty good. => Due for recheck and labs.  We will check them in 3 months after he has changed his diet.  This will be prior to seeing CVRR.  Seems to be tolerating 80 mg rosuvastatin plus Zetia.Marland Kitchen Hopefully with continued adjusting diet and weight loss, we may be able back down to a lower dose of atorvastatin.

## 2023-03-18 NOTE — Assessment & Plan Note (Signed)
Almost 3 years out from STEMI with two-vessel PCI.  No recurrent anginal symptoms.  Major issue is his weight.  Plan: All maintenance dose Brilinta 60 mg twice daily.  We can stop aspirin-but he indicates that he is always taking aspirin so we will continue to do so. Continue atorvastatin 80 mg plus Zio 10 mg with lipids well-controlled as of September. Continue moderate dose carvedilol and high-dose losartan. ->  Low threshold to increase carvedilol if pressures do not decrease.

## 2023-03-18 NOTE — Assessment & Plan Note (Signed)
He did remark that he well with changing his diet getting elevated to 318 pounds but now is back up higher than it was last year at this time.  I truly think for him to maintain reduce weight, he needs to get into program.  He just purchased a new GOLO packet for this for 3 months.  He wants to give that a try.  We can give him a 53-month grace period to reinitiate diet, will have him follow-up with CVRR (Cardiovascular Risk Reduction) clinic to discuss GLP-1 agonist - Zepbound/Wegovy based on new research findings.  With CAD and morbid obesity, should qualify.  We did discuss watching his diet not only what he eats but how much he eats.  His wife is actually on Wegovy and doing well with it.

## 2023-03-26 ENCOUNTER — Other Ambulatory Visit: Payer: Self-pay | Admitting: Cardiology

## 2023-03-27 ENCOUNTER — Other Ambulatory Visit: Payer: Self-pay

## 2023-03-27 ENCOUNTER — Other Ambulatory Visit (HOSPITAL_COMMUNITY): Payer: Self-pay

## 2023-03-27 MED ORDER — LOSARTAN POTASSIUM 100 MG PO TABS
100.0000 mg | ORAL_TABLET | Freq: Every day | ORAL | 3 refills | Status: DC
  Filled 2023-03-27: qty 90, 90d supply, fill #0
  Filled 2023-06-26: qty 90, 90d supply, fill #1
  Filled 2023-09-22: qty 90, 90d supply, fill #2
  Filled 2023-12-22: qty 90, 90d supply, fill #3

## 2023-03-30 ENCOUNTER — Encounter: Payer: Self-pay | Admitting: *Deleted

## 2023-04-07 ENCOUNTER — Other Ambulatory Visit (HOSPITAL_COMMUNITY): Payer: Self-pay

## 2023-04-11 ENCOUNTER — Other Ambulatory Visit (HOSPITAL_COMMUNITY): Payer: Self-pay

## 2023-04-13 ENCOUNTER — Telehealth: Payer: Self-pay | Admitting: Radiology

## 2023-04-13 DIAGNOSIS — M25551 Pain in right hip: Secondary | ICD-10-CM

## 2023-04-13 DIAGNOSIS — M545 Other chronic pain: Secondary | ICD-10-CM

## 2023-04-13 NOTE — Telephone Encounter (Signed)
-----   Message from Vickki Hearing, MD sent at 04/13/2023  7:51 AM EDT ----- He needs a pain mngmnt referral

## 2023-04-16 ENCOUNTER — Other Ambulatory Visit: Payer: Self-pay | Admitting: Cardiology

## 2023-04-17 ENCOUNTER — Other Ambulatory Visit (HOSPITAL_COMMUNITY): Payer: Self-pay

## 2023-04-17 MED ORDER — ATORVASTATIN CALCIUM 80 MG PO TABS
80.0000 mg | ORAL_TABLET | Freq: Every day | ORAL | 1 refills | Status: DC
  Filled 2023-04-17: qty 90, 90d supply, fill #0
  Filled 2023-07-19: qty 90, 90d supply, fill #1

## 2023-04-17 NOTE — Telephone Encounter (Signed)
Rx request sent to pharmacy.  

## 2023-04-18 ENCOUNTER — Other Ambulatory Visit: Payer: Self-pay

## 2023-05-09 ENCOUNTER — Other Ambulatory Visit (HOSPITAL_COMMUNITY): Payer: Self-pay

## 2023-06-11 ENCOUNTER — Other Ambulatory Visit: Payer: Self-pay | Admitting: Cardiology

## 2023-06-12 ENCOUNTER — Other Ambulatory Visit (HOSPITAL_COMMUNITY): Payer: Self-pay

## 2023-06-12 MED ORDER — TICAGRELOR 60 MG PO TABS
60.0000 mg | ORAL_TABLET | Freq: Two times a day (BID) | ORAL | 1 refills | Status: DC
  Filled 2023-06-12 (×2): qty 180, 90d supply, fill #0
  Filled 2023-09-12: qty 180, 90d supply, fill #1

## 2023-06-13 ENCOUNTER — Other Ambulatory Visit (HOSPITAL_COMMUNITY): Payer: Self-pay

## 2023-06-13 ENCOUNTER — Telehealth: Payer: Self-pay | Admitting: Orthopedic Surgery

## 2023-06-13 NOTE — Telephone Encounter (Signed)
Left message to see if he plans to go to the pain management clinic Dr Romeo Apple referred him to , they left message for him to call back an he has not called to schedule yet. If yes, I can give him number if no then I will close the referral

## 2023-06-16 ENCOUNTER — Telehealth: Payer: Self-pay | Admitting: Orthopedic Surgery

## 2023-06-16 NOTE — Telephone Encounter (Signed)
DR. Romeo Apple   Amy,   Patient called at 3:19 pm yesterday afternoon.  He said you called his wife's phone.   Please call him back at [phone].

## 2023-06-16 NOTE — Telephone Encounter (Signed)
Yes, I want to ask him if he is going to pain management will call him again next time I am able to work on the referrals I made a note in the chart unfortunately no one saw it when call was returned. Will have to start again at zero.

## 2023-06-26 ENCOUNTER — Other Ambulatory Visit (HOSPITAL_COMMUNITY): Payer: Self-pay

## 2023-07-04 ENCOUNTER — Other Ambulatory Visit (HOSPITAL_COMMUNITY): Payer: Self-pay

## 2023-07-19 ENCOUNTER — Other Ambulatory Visit (HOSPITAL_COMMUNITY): Payer: Self-pay

## 2023-08-07 ENCOUNTER — Other Ambulatory Visit: Payer: Self-pay | Admitting: Cardiology

## 2023-08-08 ENCOUNTER — Other Ambulatory Visit: Payer: Self-pay

## 2023-08-08 ENCOUNTER — Other Ambulatory Visit (HOSPITAL_COMMUNITY): Payer: Self-pay

## 2023-08-08 MED ORDER — CARVEDILOL 12.5 MG PO TABS
12.5000 mg | ORAL_TABLET | Freq: Two times a day (BID) | ORAL | 3 refills | Status: DC
  Filled 2023-08-08: qty 180, 90d supply, fill #0
  Filled 2023-11-07: qty 180, 90d supply, fill #1
  Filled 2024-02-06: qty 180, 90d supply, fill #2
  Filled 2024-05-02: qty 180, 90d supply, fill #3

## 2023-09-12 ENCOUNTER — Other Ambulatory Visit: Payer: Self-pay

## 2023-09-22 ENCOUNTER — Other Ambulatory Visit (HOSPITAL_COMMUNITY): Payer: Self-pay

## 2023-10-09 ENCOUNTER — Other Ambulatory Visit (HOSPITAL_COMMUNITY): Payer: Self-pay

## 2023-10-10 ENCOUNTER — Other Ambulatory Visit (HOSPITAL_COMMUNITY): Payer: Self-pay

## 2023-10-18 ENCOUNTER — Other Ambulatory Visit: Payer: Self-pay | Admitting: Cardiology

## 2023-10-19 ENCOUNTER — Other Ambulatory Visit (HOSPITAL_COMMUNITY): Payer: Self-pay

## 2023-10-19 ENCOUNTER — Other Ambulatory Visit: Payer: Self-pay

## 2023-10-19 MED ORDER — ATORVASTATIN CALCIUM 80 MG PO TABS
80.0000 mg | ORAL_TABLET | Freq: Every day | ORAL | 1 refills | Status: DC
  Filled 2023-10-19: qty 90, 90d supply, fill #0
  Filled 2024-01-17: qty 90, 90d supply, fill #1

## 2023-11-07 ENCOUNTER — Other Ambulatory Visit (HOSPITAL_COMMUNITY): Payer: Self-pay | Admitting: General Surgery

## 2023-11-07 ENCOUNTER — Ambulatory Visit (INDEPENDENT_AMBULATORY_CARE_PROVIDER_SITE_OTHER): Payer: 59 | Admitting: General Surgery

## 2023-11-07 ENCOUNTER — Other Ambulatory Visit (HOSPITAL_COMMUNITY): Payer: Self-pay

## 2023-11-07 ENCOUNTER — Encounter: Payer: Self-pay | Admitting: General Surgery

## 2023-11-07 VITALS — BP 181/99 | HR 69 | Temp 98.0°F | Resp 14 | Ht 70.5 in | Wt 366.0 lb

## 2023-11-07 DIAGNOSIS — N62 Hypertrophy of breast: Secondary | ICD-10-CM | POA: Diagnosis not present

## 2023-11-07 NOTE — Progress Notes (Signed)
Gordon Richards; [id number]; July 05, 1961   HPI Patient is a 62 year old white male who referred himself to my care for evaluation and treatment of a possible tender lipoma of the buttocks.  He states he has had lipomas in the past and he is concerned that when he sits there is pressure and it is causing him pain.  He has multiple medical issues including hip pain.  He also was concerned about bilateral breast enlargement.  He describes having tender nodules behind the nipples. Past Medical History:  Diagnosis Date   Acute ST elevation myocardial infarction (STEMI) of inferior wall (HCC) 05/07/2020   Likely culprit lesion was 95% thrombotic mRCA (in tandem with pRCA 60%) - Lesion #1 DES PCI; OM1 also had 95% lesion (porential culprit - Lesion #2 DES PCI); ~CTO of AVG Cx after OM1 (unable to cross). EF 45-50% - Mod LVEDP with severe systemic HTN.    CAD S/P DES PCI - pRCA, ostOM1. CTO of AVG Cx 05/08/2020    pRCA 60%& mRCA 95% (Resolute Onyx DES 4.0 mm x 34 mm -> postdilated from 5.2-4.6 mm), RPAV 40% -RPL 2; OM1 95% (Resolute Onyx DES 2.75 mm x15 mm-2.95 mm), OM1 60%, small caliber LP AV 65% and 99% granulated with no loss (likely CTO); p-mLAD 40%, mLAD 40%.   Hyperlipidemia due to dietary fat intake 05/08/2020   Morbid obesity (HCC) 05/08/2020    Past Surgical History:  Procedure Laterality Date   COLONOSCOPY WITH PROPOFOL N/A 10/26/2015   Procedure: COLONOSCOPY WITH PROPOFOL;  Surgeon: Malissa Hippo, MD;  Location: [facility];  Service: Endoscopy;  Laterality: N/A;  Cecum time in  0748  time out  0758  total time 10 minutes   CORONARY/GRAFT ACUTE MI REVASCULARIZATION N/A 05/07/2020   Procedure: Coronary/Graft Acute MI Revascularization;  Surgeon: Marykay Lex, MD;  Location: [facility];; INF STEMI: Lesions #1: pRCA 60%& mRCA 95% (covering both: Resolute Onyx DES 4.0 mm x 34 mm -> post-dil from 5.2-4.6 mm); Lesion #2: OM1 95% (Resolute Onyx DES 2.75 mm x15 mm-2.95 mm),    ESOPHAGOGASTRODUODENOSCOPY N/A 10/09/2015   Procedure: ESOPHAGOGASTRODUODENOSCOPY (EGD);  Surgeon: Malissa Hippo, MD;  Location: [facility];  Service: Endoscopy;  Laterality: N/A;   FOOT SURGERY Right    bone cutting   HEMORROIDECTOMY  2003   LEFT HEART CATH AND CORONARY ANGIOGRAPHY N/A 05/07/2020   Procedure: LEFT HEART CATH AND CORONARY ANGIOGRAPHY;  Surgeon: Marykay Lex, MD;  Location: [facility];;  pRCA 60%& mRCA 95% (Lesion #1 DES PCI), RPAV 40% -RPL 2; OM1 95% (Lesion #2 DES PCI), OM1 60%; small caliber LP AV 65% and 99% unable to cannulate (likely CTO); p-mLAD 40%, mLAD 40%. EF 45-50%. Mod elevated LVEDP   LIPOMA EXCISION Right 11/08/2013   Procedure: EXCISION NEOPLASM RIGHT ARM;  Surgeon: Dalia Heading, MD;  Location: [facility];  Service: General;  Laterality: Right;   MASS EXCISION Right 11/08/2013   Procedure: EXCISION NEOPLASM SCROTUM;  Surgeon: Dalia Heading, MD;  Location: [facility];  Service: General;  Laterality: Right;   TRANSTHORACIC ECHOCARDIOGRAM  05/07/2020   In setting of inferior STEMI: EF 45-50%.  Moderate HK of the mid-apical inferior and lateral wall.  Otherwise normal.   TRANSTHORACIC ECHOCARDIOGRAM  08/21/2020   EF 65-70%. Gr 2 DD. No RWMA. Mild Asc Ao dilation - 39 mm. Normal valves. Normal RV. Normal Atriae.    Family History  Problem Relation Age of Onset   Healthy Mother  Heart attack Maternal Grandfather 47   Parkinson's disease Paternal Grandfather 69   Heart attack Maternal Uncle    Heart attack Paternal Uncle 48    Current Outpatient Medications on File Prior to Visit  Medication Sig Dispense Refill   aspirin EC 81 MG tablet Take 81 mg by mouth daily. Swallow whole.     atorvastatin (LIPITOR) 80 MG tablet Take 1 tablet (80 mg total) by mouth daily. 90 tablet 1   carvedilol (COREG) 12.5 MG tablet Take 1 tablet (12.5 mg total) by mouth 2 (two) times daily with a meal. 180 tablet 3   Cholecalciferol (VITAMIN D3) 25 MCG (1000 UT) CAPS Take  by mouth.     ezetimibe (ZETIA) 10 MG tablet Take 1 tablet (10 mg total) by mouth daily. 90 tablet 3   furosemide (LASIX) 40 MG tablet Take 1 tablet (40 mg total) by mouth daily as needed for swelling 30 tablet 3   ibuprofen (ADVIL) 800 MG tablet Take 800 mg by mouth every 4 (four) hours.     losartan (COZAAR) 100 MG tablet Take 1 tablet (100 mg total) by mouth daily. 90 tablet 3   nitroGLYCERIN (NITROSTAT) 0.4 MG SL tablet Place 1 tablet (0.4 mg total) under the tongue every 5 (five) minutes x 3 doses as needed for chest pain. 25 tablet 1   ticagrelor (BRILINTA) 60 MG TABS tablet Take 1 tablet (60 mg total) by mouth 2 (two) times daily. 180 tablet 1   vitamin C (ASCORBIC ACID) 500 MG tablet Take 2,000 mg by mouth daily.      diphenhydramine-acetaminophen (TYLENOL PM) 25-500 MG TABS tablet Take 1 tablet by mouth at bedtime as needed.     No current facility-administered medications on file prior to visit.    No Known Allergies  Social History   Substance and Sexual Activity  Alcohol Use Yes   Alcohol/week: 0.0 standard drinks of alcohol   Comment: rarely    Social History   Tobacco Use  Smoking Status Former   Current packs/day: 1.00   Average packs/day: 1 pack/day for 5.0 years (5.0 ttl pk-yrs)   Types: Cigarettes  Smokeless Tobacco Never    Review of Systems  Constitutional: Negative.   HENT: Negative.    Eyes: Negative.   Respiratory: Negative.    Cardiovascular: Negative.   Genitourinary:  Positive for frequency.  Musculoskeletal:  Positive for joint pain.  Skin: Negative.   Neurological: Negative.   Endo/Heme/Allergies: Negative.   Psychiatric/Behavioral: Negative.      Objective   Vitals:   11/07/23 1443  BP: (!) 181/99  Pulse: 69  Resp: 14  Temp: 98 F (36.7 C)  SpO2: 90%    Physical Exam Vitals reviewed.  Constitutional:      Appearance: Normal appearance. He is obese. He is not ill-appearing.  HENT:     Head: Normocephalic and atraumatic.   Cardiovascular:     Rate and Rhythm: Normal rate and regular rhythm.     Heart sounds: Normal heart sounds. No murmur heard.    No friction rub. No gallop.  Pulmonary:     Effort: Pulmonary effort is normal. No respiratory distress.     Breath sounds: Normal breath sounds. No stridor. No wheezing, rhonchi or rales.  Musculoskeletal:     Comments: An indistinct area of subcutaneous tissue is present at the lower buttock along the lower pelvis posteriorly.  It is not mobile and is difficult to fully assess certain whether this is a lipoma.  Skin:    General: Skin is warm and dry.  Neurological:     Mental Status: He is alert and oriented to person, place, and time.   Breast: Rubbery retroareolar lumps are noted bilaterally.  No dominant mass, nipple discharge, or dimpling are noted.  Assessment  Possible lipoma of the right buttock. Bilateral gynecomastia Plan  I told the patient that I do not recommend exploration for the lipoma of the right buttock as it is overlying the pelvic bone and he has a poor prognosis of adequate healing of that area. Concerning the gynecomastia, we will get bilateral mammograms to confirm the diagnosis.  I reassured the patient that this is most likely secondary to his weight as well as medications.  Further follow-up pending the mammograms.

## 2023-12-05 ENCOUNTER — Other Ambulatory Visit (HOSPITAL_COMMUNITY): Payer: 59

## 2023-12-05 ENCOUNTER — Encounter (HOSPITAL_COMMUNITY): Payer: 59

## 2023-12-12 ENCOUNTER — Other Ambulatory Visit (HOSPITAL_COMMUNITY): Payer: Self-pay

## 2023-12-12 ENCOUNTER — Other Ambulatory Visit: Payer: Self-pay

## 2023-12-12 ENCOUNTER — Other Ambulatory Visit: Payer: Self-pay | Admitting: Cardiology

## 2023-12-12 MED ORDER — TICAGRELOR 60 MG PO TABS
60.0000 mg | ORAL_TABLET | Freq: Two times a day (BID) | ORAL | 0 refills | Status: DC
  Filled 2023-12-12: qty 180, 90d supply, fill #0

## 2023-12-22 ENCOUNTER — Other Ambulatory Visit (HOSPITAL_COMMUNITY): Payer: Self-pay

## 2024-01-01 ENCOUNTER — Other Ambulatory Visit: Payer: Self-pay | Admitting: Cardiology

## 2024-01-03 ENCOUNTER — Other Ambulatory Visit: Payer: Self-pay

## 2024-01-03 ENCOUNTER — Other Ambulatory Visit (HOSPITAL_COMMUNITY): Payer: Self-pay

## 2024-01-03 MED ORDER — EZETIMIBE 10 MG PO TABS
10.0000 mg | ORAL_TABLET | Freq: Every day | ORAL | 0 refills | Status: DC
  Filled 2024-01-03: qty 90, 90d supply, fill #0

## 2024-01-18 ENCOUNTER — Other Ambulatory Visit (HOSPITAL_COMMUNITY): Payer: Self-pay

## 2024-02-06 ENCOUNTER — Other Ambulatory Visit (HOSPITAL_COMMUNITY): Payer: Self-pay

## 2024-03-07 DIAGNOSIS — I10 Essential (primary) hypertension: Secondary | ICD-10-CM | POA: Diagnosis not present

## 2024-03-07 DIAGNOSIS — I2119 ST elevation (STEMI) myocardial infarction involving other coronary artery of inferior wall: Secondary | ICD-10-CM | POA: Diagnosis not present

## 2024-03-07 DIAGNOSIS — Z9861 Coronary angioplasty status: Secondary | ICD-10-CM | POA: Diagnosis not present

## 2024-03-07 DIAGNOSIS — I2510 Atherosclerotic heart disease of native coronary artery without angina pectoris: Secondary | ICD-10-CM | POA: Diagnosis not present

## 2024-03-07 DIAGNOSIS — E7849 Other hyperlipidemia: Secondary | ICD-10-CM | POA: Diagnosis not present

## 2024-03-08 LAB — HEMOGLOBIN A1C
Est. average glucose Bld gHb Est-mCnc: 131 mg/dL
Hgb A1c MFr Bld: 6.2 % — ABNORMAL HIGH (ref 4.8–5.6)

## 2024-03-08 LAB — LIPID PANEL
Chol/HDL Ratio: 2.9 ratio (ref 0.0–5.0)
Cholesterol, Total: 97 mg/dL — ABNORMAL LOW (ref 100–199)
HDL: 33 mg/dL — ABNORMAL LOW (ref >39)
LDL Chol Calc (NIH): 43 mg/dL (ref 0–99)
Triglycerides: 114 mg/dL (ref 0–149)
VLDL Cholesterol Cal: 21 mg/dL (ref 5–40)

## 2024-03-08 LAB — COMPREHENSIVE METABOLIC PANEL
ALT: 43 IU/L (ref 0–44)
AST: 28 IU/L (ref 0–40)
Albumin: 4.4 g/dL (ref 3.9–4.9)
Alkaline Phosphatase: 90 IU/L (ref 44–121)
BUN/Creatinine Ratio: 22 (ref 10–24)
BUN: 18 mg/dL (ref 8–27)
Bilirubin Total: 0.3 mg/dL (ref 0.0–1.2)
CO2: 25 mmol/L (ref 20–29)
Calcium: 9.6 mg/dL (ref 8.6–10.2)
Chloride: 101 mmol/L (ref 96–106)
Creatinine, Ser: 0.82 mg/dL (ref 0.76–1.27)
Globulin, Total: 2.6 g/dL (ref 1.5–4.5)
Glucose: 110 mg/dL — ABNORMAL HIGH (ref 70–99)
Potassium: 4.8 mmol/L (ref 3.5–5.2)
Sodium: 139 mmol/L (ref 134–144)
Total Protein: 7 g/dL (ref 6.0–8.5)
eGFR: 100 mL/min/{1.73_m2} (ref >59)

## 2024-03-08 LAB — TSH: TSH: 3.4 u[IU]/mL (ref 0.450–4.500)

## 2024-03-11 ENCOUNTER — Other Ambulatory Visit (HOSPITAL_COMMUNITY): Payer: Self-pay

## 2024-03-11 ENCOUNTER — Ambulatory Visit: Payer: 59 | Attending: Cardiology | Admitting: Cardiology

## 2024-03-11 ENCOUNTER — Other Ambulatory Visit: Payer: Self-pay | Admitting: Cardiology

## 2024-03-11 ENCOUNTER — Other Ambulatory Visit: Payer: Self-pay

## 2024-03-11 VITALS — BP 174/84 | HR 67 | Ht 70.0 in | Wt 377.0 lb

## 2024-03-11 DIAGNOSIS — E7849 Other hyperlipidemia: Secondary | ICD-10-CM | POA: Diagnosis not present

## 2024-03-11 DIAGNOSIS — I2119 ST elevation (STEMI) myocardial infarction involving other coronary artery of inferior wall: Secondary | ICD-10-CM

## 2024-03-11 DIAGNOSIS — I5032 Chronic diastolic (congestive) heart failure: Secondary | ICD-10-CM | POA: Diagnosis not present

## 2024-03-11 DIAGNOSIS — I10 Essential (primary) hypertension: Secondary | ICD-10-CM

## 2024-03-11 DIAGNOSIS — R7303 Prediabetes: Secondary | ICD-10-CM

## 2024-03-11 DIAGNOSIS — I2510 Atherosclerotic heart disease of native coronary artery without angina pectoris: Secondary | ICD-10-CM | POA: Diagnosis not present

## 2024-03-11 MED ORDER — TICAGRELOR 60 MG PO TABS
60.0000 mg | ORAL_TABLET | Freq: Two times a day (BID) | ORAL | 0 refills | Status: DC
  Filled 2024-03-11: qty 180, 90d supply, fill #0

## 2024-03-11 MED ORDER — TELMISARTAN-HCTZ 80-25 MG PO TABS
1.0000 | ORAL_TABLET | Freq: Every day | ORAL | 3 refills | Status: DC
  Filled 2024-03-11: qty 90, 90d supply, fill #0

## 2024-03-11 NOTE — Patient Instructions (Signed)
 Medication Instructions:  Stop taking Aspirin     Stop taking Losartan    Start taking Telmesartan/Hctz  ( Micardis) 80/25 mg daily   *If you need a refill on your cardiac medications before your next appointment, please call your pharmacy*   Lab Work:  If you have labs (blood work) drawn today and your tests are completely normal, you will receive your results only by: MyChart Message (if you have MyChart) OR A paper copy in the mail If you have any lab test that is abnormal or we need to change your treatment, we will call you to review the results.   Testing/Procedures:  Not needed  Follow-Up: At [facility], you and your health needs are our priority.  As part of our continuing mission to provide you with exceptional heart care, we have created designated Provider Care Teams.  These Care Teams include your primary Cardiologist (physician) and Advanced Practice Providers (APPs -  Physician Assistants and Nurse Practitioners) who all work together to provide you with the care you need, when you need it.     Your next appointment:   3 month(s)  The format for your next appointment:   In Person  Provider:   You will see one of the following Advanced Practice Providers on your designated Care Team:   Randall An, PA-C  Jacolyn Reedy, PA-C   Then, Bryan Lemma, MD will plan to see you again in 8 month(s) [facility]) .   Other Instructions

## 2024-03-11 NOTE — Progress Notes (Unsigned)
 Cardiology Office Note:  .   Date:  03/14/2024  ID:  Marga Hoots, DOB 1961-12-09, MRN [id number] PCP: Aviva Kluver  [facility] Providers Cardiologist:  Bryan Lemma, MD Cardiology APP:  Beatriz Stallion., PA-C (Inactive)     Chief Complaint  Patient presents with   Follow-up    Annual.  More sedentary, gained weight.  Poorly controlled BP.   Coronary Artery Disease    No angina    Patient Profile: .     Lesly Dukes Mickelson is a super morbidly obese 63 y.o. male former smoker with a PMH notable for CAD (inferior STEMI with two-vessel PCI), HTN and HLD who presents here for annual follow-up at the request of No ref. provider found.  PMH  Inferior STEMI-(May 07, 2020 -> PCI to RCA with staged PCI to OM1 and 99% likely CTO of LP AV),  RESOLVED ISCHEMIC CM --> EF of 45% Post MI -- Resolved after Revascularization & med Rx.   EF by follow-up TTE was 65 to 70% HTN and HLD Morbid Obesity -- huge wt swings Previously used to be very active and exercise, but got deconditioned after retirement and gained a ton of weight.  He tends to left this off. Has been elevated on 318 pounds in October and now has gained weight back to 363 pounds.    Brevon C Quinnell was last seen on in March 2024-he gained significant weight back.  He had pretty much stopped physical activity hide very sedentary due to joint pain...  Otherwise feeling okay.  As usual, he was upset about his weight gain and blood pressures and indicated that he ordered a "try to do it on his own".  Subjective  Discussed the use of AI scribe software for clinical note transcription with the patient, who gave verbal consent to proceed.  History of Present Illness   RAEL YO is a 63 year old male with obesity and a history of heart attack who presents for annual visit mostly dealing with issues involving weight management and hip pain.  He has experienced significant weight gain over the past year, increasing from 318 pounds to 377  pounds. He wants to become more serious about weight loss by reducing sugar and carbohydrate intake. He has not tried medications like Wegovy or Ozempic due to cost and insurance coverage issues.  He has a history of a heart attack but has not had any further concerning symptoms of chest pain or pressure with rest or exertion.    He has significant knee & hip pain which has impacted his ability to exercise. Previously, (long before his MI) he would run a couple of miles daily and engage in physical exercise, but he is now unable to do so due to the loss of cartilage in his right hip. This has led to a sedentary lifestyle, contributing to his weight gain. He mentions that his doctor will not perform hip surgery unless he loses weight, creating a 'catch-22' situation where he cannot exercise to lose weight due to his hip condition. He has built a gym beside his house but admits to not using it. He is considering alternative forms of exercise that are less impactful on his hip, such as using a pedal bike or walking in water.  (This is a recurrent theme with Taevin -> Despite his admonitions of self and statements of "I can do this on my ", he has failed to do so and is continue to gain weight and become more  sedentary.  His blood pressures poorly controlled, and he has yet to establish with a primary care despite having his primary care doctor passed away years ago.  He does not have a primary care physician since his previous doctor passed away several years ago. He resides in [place] and is considering finding a new primary care provider in the area.  He is currently taking losartan for blood pressure management, but his blood pressure remains high, recorded at 180/80 mmHg. He also takes Brilinta and aspirin but is advised to stop one due to the risk of bleeding. His cholesterol levels are reportedly good, and his A1c indicates prediabetes, but he is not diabetic.      Cardiovascular ROS: positive for -  dyspnea on exertion, edema, and exercise intolerance, mostly limited by OA pain.  He is very sedentary and significantly obese.  Has puffy swelling in the ankles and calf which has been present for years. negative for - chest pain, irregular heartbeat, orthopnea, palpitations, paroxysmal nocturnal dyspnea, rapid heart rate, shortness of breath, or lightheadedness dizziness or wooziness, syncope or near syncope, TIA/CVA/amaurosis fugax, claudication.  ROS:  Review of Systems - Negative except significant weight gain, and essentially lack of self-care.    Objective    Studies Reviewed: Marland Kitchen   EKG Interpretation Date/Time:  Monday March 11 2024 14:51:22 EDT Ventricular Rate:  67 PR Interval:  174 QRS Duration:  148 QT Interval:  426 QTC Calculation: 450 R Axis:   -8  Text Interpretation: Normal sinus rhythm Right bundle branch block Cannot rule out Inferior infarct (cited on or before 07-May-2020) When compared with ECG of 08-May-2020 06:35, Right bundle branch block has replaced Incomplete right bundle branch block Confirmed by Bryan Lemma ([id number]) on 03/11/2024 3:07:14 PM    Labs 03/07/2024: Lab Results  Component Value Date   CHOL 97 (L) 03/07/2024   HDL 33 (L) 03/07/2024   LDLCALC 43 03/07/2024   TRIG 114 03/07/2024   CHOLHDL 2.9 03/07/2024   Lab Results  Component Value Date   NA 139 03/07/2024   K 4.8 03/07/2024   CREATININE 0.82 03/07/2024   EGFR 100 03/07/2024   GLUCOSE 110 (H) 03/07/2024   Lab Results  Component Value Date   HGBA1C 6.2 (H) 03/07/2024      Latest Ref Rng & Units 09/08/2022    8:09 AM 05/08/2020    2:34 AM 05/07/2020    8:21 AM  CBC  WBC 3.4 - 10.8 x10E3/uL 6.2  12.0  11.9   Hemoglobin 13.0 - 17.7 g/dL 74.2  59.5  63.8   Hematocrit 37.5 - 51.0 % 42.8  46.7  48.4   Platelets 150 - 450 x10E3/uL 213  223  237     CATH 05/07/2020 SEVERE THREE-VESSEL DISEASE with proximal RCA 60% followed by mid RCA 95% thrombotic subtotal occlusion (culprit lesion),  99% thrombotic stenosis of large bifurcating OM1 and 70 to 100% likely CTO occlusion of AV groove circumflex (unable to cross). Successful DES PCI of proximal to mid RCA using Resolute Onyx 4.0 mm x 34 mm postdilated to  5.2 mm proximal and 4.6 mm distal Successful DES PCI of ostial OM1 with Resolute Onyx DES 2.75 mm x15 mm postdilated to 2.95 mm Moderately elevated LVEDP. Severe systemic hypertension.  ECHO 08/21/2020: EF 65 to 70%.  No RWMA.  GR 2 DD.  Normal valves.  Normal RAP. ECHO 05/07/2020 WMI-EF 45 to 50% with moderate HK of mid apical inferior and lateral wall.   Risk Assessment/Calculations:  HYPERTENSION CONTROL Vitals:   03/11/24 1453 03/14/24 0108 03/14/24 0110  BP: (!) 182/80 (!) 176/88 (!) 174/84    The patient's blood pressure is elevated above target today.  In order to address the patient's elevated BP: Blood pressure will be monitored at home to determine if medication changes need to be made.; A current anti-hypertensive medication was adjusted today. (Stopping losartan 100 mg daily and changed her Micardis-HCTZ 80-25 mg daily.  Close follow-up with APP or CVRR.)           Physical Exam:   VS:  BP (!) 174/84   Pulse 67   Ht 5\' 10"  (1.778 m)   Wt (!) 377 lb (171 kg)   SpO2 90%   BMI 54.09 kg/m    Wt Readings from Last 3 Encounters:  03/11/24 (!) 377 lb (171 kg)  11/07/23 (!) 366 lb (166 kg)  03/17/23 (!) 363 lb 4.8 oz (164.8 kg)    GEN: Well nourished, well developed in no acute distress; super morbidly obese.  Walking with 2 canes because of hip and knee pain. NECK: No JVD; No carotid bruits CARDIAC: Distant heart sounds, barely able to hear S1, S2; RRR, no murmurs, rubs, gallops RESPIRATORY:  Clear to auscultation without rales, wheezing or rhonchi ; nonlabored, good air movement. ABDOMEN: Soft, non-tender, non-distended EXTREMITIES: Puffy bilateral ankle and calf edema; No deformity      ASSESSMENT AND PLAN: .    Problem List Items Addressed  This Visit       Cardiology Problems   Chronic heart failure with preserved ejection fraction (HFpEF) (HCC) (Chronic)   Hard to tell if he is euvolemic or not based on body habitus, but he is not really having significant edema-uses furosemide 40 mg PRN only intermittently.  It is hard to tell if he has PND orthopnea because he gets short of breath lying down because of body habitus.      Relevant Medications   telmisartan-hydrochlorothiazide (MICARDIS HCT) 80-25 MG tablet   Coronary artery disease involving native coronary artery of native heart without angina pectoris - Primary (Chronic)   Multivessel disease with stents in the RCA and OM.  Thankfully no recurrent anginal symptoms. -He is on aspirin 81 mg and Brilinta 60 mg twice daily. => Stop aspirin Okay to hold Brilinta 5 to 7 days preop for surgeries or procedures. -On 80 mg atorvastatin-with recent lipids showing LDL 43. -Continue carvedilol 12.5 mg twice daily but low threshold to titrate further -Convert from losartan 100 mg to Micardis HCTZ 80-25 mg daily  Discussed the importance of weight loss and increasing exercise.  He needs to get control of his life.      Relevant Medications   telmisartan-hydrochlorothiazide (MICARDIS HCT) 80-25 MG tablet   Other Relevant Orders   EKG 12-Lead (Completed)   Essential hypertension (Chronic)   Ridiculously poor control today.  Initial recorded pressure was 180/80 mmHg.  Losartan insufficient.  Plan to switch to combination medication and monitor kidney function. - Discontinue losartan. - Initiate Micardis HCTZ 80/25 mg. - Check chemistry panel in two weeks. - Schedule follow-up blood pressure check in two months with PA in [place].  -> Anticipate further titration of carvedilol versus addition of amlodipine.      Relevant Medications   telmisartan-hydrochlorothiazide (MICARDIS HCT) 80-25 MG tablet   Hyperlipidemia due to dietary fat intake (Chronic)   LDL of 43 is well  within target range of less than 55 on current dose of atorvastatin 80 mg.  Amazingly his lipids are controlled.  -Continue atorvastatin 80 mg daily      Relevant Medications   telmisartan-hydrochlorothiazide (MICARDIS HCT) 80-25 MG tablet   ST elevation myocardial infarction (STEMI) of inferior wall (HCC) (Chronic)   Just about 4 years out from his MI.  Thankfully his EF improved after RCA and circumflex PCI. Very frustrating that he did not learn his lesson and has continued to take for care of himself.  He had lost all the way down to 318 pounds.      Relevant Medications   telmisartan-hydrochlorothiazide (MICARDIS HCT) 80-25 MG tablet     Other   Morbid obesity (HCC) (Chronic)   Weight increased to 377 lbs. Unable to exercise due to hip issues. Prefers diet and lifestyle changes over medication. - Encourage dietary changes to reduce sugar and carb intake. - Consider referral to Healthy Weight and Wellness if needed. - Explore exercise options such as pedal bike or water walking.  I truly think he needs nutrition consultation or referral to the wellness clinic for weight loss.  I will have him follow-up with an APP in [place] for BP recheck and to discuss possibility of referral to healthy weight loss.  We have talked about referral to CVRR to discuss Zepbound or Wegovy.  I definitely think if we either do it ourselves or we refer you to a weight loss clinic.  But he does need to be on something for weight loss.  Stressed importance of dietary modification-he cannot be taken attention to his diet as his wife is also facilitating. He must find some type of exercise.  He cannot get his hip operated on unless he loses weight.      Prediabetes   A1c levels indicate prediabetes. Focus on weight loss and lifestyle modification.       Primary Care Physician Establishment Lacks a primary care physician. Discussed options in [place]. - Explore options for establishing care with a  primary care physician in [place], possibly at [facility].  Recording duration: 18 minutes       Follow-Up: Return in about 3 months (around 06/11/2024) for Follow-up with APP in ;, Alternate 3 months with APP, 6 month with MD.  I spent 41 minutes in the care of Daton C Speirs today including reviewing labs (2 min), reviewing studies (reviewed cath films and echo results - 5 min), face to face time discussing treatment options (18 min), reviewing records from previous clinic notes (5 min), 11 minutes dictating, and documenting in the encounter.    Signed, Marykay Lex, MD, MS Bryan Lemma, M.D., M.S. Interventional Cardiologist  [facility]  Pager # [phone] Phone # [phone] [address] [place], [place] [address]

## 2024-03-14 ENCOUNTER — Encounter: Payer: Self-pay | Admitting: Cardiology

## 2024-03-14 DIAGNOSIS — R7303 Prediabetes: Secondary | ICD-10-CM | POA: Insufficient documentation

## 2024-03-14 NOTE — Assessment & Plan Note (Signed)
 Hard to tell if he is euvolemic or not based on body habitus, but he is not really having significant edema-uses furosemide 40 mg PRN only intermittently.  It is hard to tell if he has PND orthopnea because he gets short of breath lying down because of body habitus.

## 2024-03-14 NOTE — Assessment & Plan Note (Addendum)
 Multivessel disease with stents in the RCA and OM.  Thankfully no recurrent anginal symptoms. -He is on aspirin 81 mg and Brilinta 60 mg twice daily. => Stop aspirin Okay to hold Brilinta 5 to 7 days preop for surgeries or procedures. -On 80 mg atorvastatin-with recent lipids showing LDL 43. -Continue carvedilol 12.5 mg twice daily but low threshold to titrate further -Convert from losartan 100 mg to Micardis HCTZ 80-25 mg daily  Discussed the importance of weight loss and increasing exercise.  He needs to get control of his life.

## 2024-03-14 NOTE — Assessment & Plan Note (Signed)
 Ridiculously poor control today.  Initial recorded pressure was 180/80 mmHg.  Losartan insufficient.  Plan to switch to combination medication and monitor kidney function. - Discontinue losartan. - Initiate Micardis HCTZ 80/25 mg. - Check chemistry panel in two weeks. - Schedule follow-up blood pressure check in two months with PA in [place].  -> Anticipate further titration of carvedilol versus addition of amlodipine.

## 2024-03-14 NOTE — Assessment & Plan Note (Addendum)
 LDL of 43 is well within target range of less than 55 on current dose of atorvastatin 80 mg.  Amazingly his lipids are controlled.  -Continue atorvastatin 80 mg daily

## 2024-03-14 NOTE — Assessment & Plan Note (Signed)
 Just about 4 years out from his MI.  Thankfully his EF improved after RCA and circumflex PCI. Very frustrating that he did not learn his lesson and has continued to take for care of himself.  He had lost all the way down to 318 pounds.

## 2024-03-14 NOTE — Assessment & Plan Note (Addendum)
 Weight increased to 377 lbs. Unable to exercise due to hip issues. Prefers diet and lifestyle changes over medication. - Encourage dietary changes to reduce sugar and carb intake. - Consider referral to Healthy Weight and Wellness if needed. - Explore exercise options such as pedal bike or water walking.  I truly think he needs nutrition consultation or referral to the wellness clinic for weight loss.  I will have him follow-up with an APP in [place] for BP recheck and to discuss possibility of referral to healthy weight loss.  We have talked about referral to CVRR to discuss Zepbound or Wegovy.  I definitely think if we either do it ourselves or we refer you to a weight loss clinic.  But he does need to be on something for weight loss.  Stressed importance of dietary modification-he cannot be taken attention to his diet as his wife is also facilitating. He must find some type of exercise.  He cannot get his hip operated on unless he loses weight.

## 2024-03-14 NOTE — Assessment & Plan Note (Signed)
 A1c levels indicate prediabetes. Focus on weight loss and lifestyle modification.

## 2024-04-01 ENCOUNTER — Other Ambulatory Visit (HOSPITAL_COMMUNITY): Payer: Self-pay

## 2024-04-01 ENCOUNTER — Other Ambulatory Visit: Payer: Self-pay | Admitting: Cardiology

## 2024-04-02 ENCOUNTER — Other Ambulatory Visit (HOSPITAL_COMMUNITY): Payer: Self-pay

## 2024-04-02 ENCOUNTER — Other Ambulatory Visit: Payer: Self-pay

## 2024-04-02 MED ORDER — EZETIMIBE 10 MG PO TABS
10.0000 mg | ORAL_TABLET | Freq: Every day | ORAL | 3 refills | Status: AC
  Filled 2024-04-02: qty 90, 90d supply, fill #0
  Filled 2024-07-01: qty 90, 90d supply, fill #1
  Filled 2024-10-07 – 2024-10-08 (×2): qty 90, 90d supply, fill #2
  Filled 2025-01-06: qty 90, 90d supply, fill #3

## 2024-04-08 ENCOUNTER — Other Ambulatory Visit: Payer: Self-pay | Admitting: Cardiology

## 2024-04-09 ENCOUNTER — Other Ambulatory Visit: Payer: Self-pay

## 2024-04-09 ENCOUNTER — Other Ambulatory Visit (HOSPITAL_COMMUNITY): Payer: Self-pay

## 2024-04-09 MED ORDER — ATORVASTATIN CALCIUM 80 MG PO TABS
80.0000 mg | ORAL_TABLET | Freq: Every day | ORAL | 3 refills | Status: AC
  Filled 2024-04-09: qty 90, 90d supply, fill #0
  Filled 2024-07-16: qty 90, 90d supply, fill #1
  Filled 2024-10-07 – 2024-10-08 (×2): qty 90, 90d supply, fill #2
  Filled 2025-01-06: qty 90, 90d supply, fill #3

## 2024-04-11 ENCOUNTER — Encounter: Payer: Self-pay | Admitting: Cardiology

## 2024-04-11 NOTE — Telephone Encounter (Signed)
 Actually, with his heart rate in the 50s to 60s, instead of increasing the carvedilol the next dose would be to actually add amlodipine 5 mg  Randene Bustard, MD

## 2024-04-11 NOTE — Telephone Encounter (Signed)
 If he is indeed taking telmisartan HCTZ, recommendation will be to double his carvedilol dose.  Randene Bustard, MD

## 2024-04-12 NOTE — Telephone Encounter (Signed)
 Called left message for patient to call or write response on Mychart. Dr Addie Holstein would like to add medication if patient is taking his blood medication

## 2024-04-16 NOTE — Telephone Encounter (Signed)
 BP looks better.  DH

## 2024-04-29 NOTE — Telephone Encounter (Signed)
 82 and try to cut the pills in half and see how pressures were.  These pressures actually look good, but if there is concerns with lightheadedness or dizziness, then cut pills in half.  Reassess pressures with the half dose.  Randene Bustard, MD

## 2024-05-02 ENCOUNTER — Other Ambulatory Visit (HOSPITAL_COMMUNITY): Payer: Self-pay

## 2024-05-06 ENCOUNTER — Other Ambulatory Visit (HOSPITAL_COMMUNITY): Payer: Self-pay

## 2024-05-06 MED ORDER — FUROSEMIDE 40 MG PO TABS
40.0000 mg | ORAL_TABLET | Freq: Every day | ORAL | 3 refills | Status: AC | PRN
  Filled 2024-05-06: qty 90, 90d supply, fill #0

## 2024-05-06 NOTE — Telephone Encounter (Signed)
 Okay to refill Lasix  40 mg daily.  Would recommend that he takes in the morning as opposed to evening.

## 2024-05-06 NOTE — Addendum Note (Signed)
 Addended by: Bebe Bourdon on: 05/06/2024 12:40 PM   Modules accepted: Orders

## 2024-05-10 DIAGNOSIS — R509 Fever, unspecified: Secondary | ICD-10-CM | POA: Diagnosis not present

## 2024-05-10 DIAGNOSIS — M791 Myalgia, unspecified site: Secondary | ICD-10-CM | POA: Diagnosis not present

## 2024-05-10 DIAGNOSIS — R5383 Other fatigue: Secondary | ICD-10-CM | POA: Diagnosis not present

## 2024-05-10 DIAGNOSIS — W57XXXA Bitten or stung by nonvenomous insect and other nonvenomous arthropods, initial encounter: Secondary | ICD-10-CM | POA: Diagnosis not present

## 2024-05-15 NOTE — Telephone Encounter (Signed)
 Sounds reasonable.  Not sure how the prescription will work, but that is fine

## 2024-05-22 DIAGNOSIS — M1611 Unilateral primary osteoarthritis, right hip: Secondary | ICD-10-CM | POA: Diagnosis not present

## 2024-05-22 DIAGNOSIS — E7849 Other hyperlipidemia: Secondary | ICD-10-CM | POA: Diagnosis not present

## 2024-05-22 DIAGNOSIS — L0390 Cellulitis, unspecified: Secondary | ICD-10-CM | POA: Diagnosis not present

## 2024-05-22 DIAGNOSIS — M25551 Pain in right hip: Secondary | ICD-10-CM | POA: Diagnosis not present

## 2024-05-22 DIAGNOSIS — I2510 Atherosclerotic heart disease of native coronary artery without angina pectoris: Secondary | ICD-10-CM | POA: Diagnosis not present

## 2024-05-22 DIAGNOSIS — I252 Old myocardial infarction: Secondary | ICD-10-CM | POA: Diagnosis not present

## 2024-05-22 DIAGNOSIS — I10 Essential (primary) hypertension: Secondary | ICD-10-CM | POA: Diagnosis not present

## 2024-06-10 NOTE — Progress Notes (Unsigned)
 Cardiology Office Note:  .   Date:  06/11/2024  ID:  Gordon Richards, DOB 09/11/1961, MRN [id number] PCP: Juvenal Opoka  [facility] Providers Cardiologist:  Randene Bustard, MD Cardiology APP:  Gaines Joy., PA-C (Inactive) {  History of Present Illness: .   Gordon Richards is a 63 y.o. male  with PMHx of CAD (s/p STEMI 05/05/20 with PCI/DES to RCA and OM1 with CTO of circumflex), HFimpEF (EF 45 to 50% in setting of STEMI in 04/2020 and most recent EF 65 to 70% in 07/2020), ischemic cardiomyopathy, HTN, HLD, morbid obesity, and tobacco abuse   reports to office for follow up on BP and weight loss.   Last seen in heartcare OV 03/14/2024 with Dr. Addie Holstein for annual follow-up.  CV ROS positive for DOE, edema and exercise intolerance, mostly limited by OA pain.  Reported significant knee and hip pain, which hinders his ability to exercise and has led to a sedentary lifestyle, contributing to his weight gain.  Noted significant weight gain over the past year increasing from 318 lbs to 377 lbs. he is not able to undergo hip surgery unless he loses weight.  BP elevated in office 180/80.  Discontinued losartan  and started telmisartan -HCTZ 80-25 mg. Discontinued ASA and continued Brilinta  60 mg twice daily.  Offered referral to weight loss clinic for Zepbound or [facility] but patient declined and prefers diet and lifestyle changes over medication.    Today, reports BP at home well controlled 120-140/60-70's. Report ongoing chronic DOE and edema that has been unchanged. Notes SOB relieved with rest and relates this to being overweight and out of shape.  He has been working on diet changes since last OV including no soda, reducing carbohydrate intake, and increasing fruits/veggies. 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  Still not interested in referral to weight loss clinic or GLP1 agonist injection. He states, I was able to lose  the weight before without medication or assistance. I know what I have to do. I just need to actually start doing it to see the results. Reports compliance with medications. States he takes 1/2 tablet of temisartan- hydrochlorothiazide  in the am and has been taking Lasix  40 mg daily due LE edema, however urinating every hour through out the night.  Denies tobacco use/Binging ETOH/drug use. Uses walker to ambulate. Live at home with wife.   Studies Reviewed: Aaron Aas       ECHO IMPRESSIONS 07/2020  1. Left ventricular ejection fraction, by estimation, is 65 to 70%. The  left ventricle has normal function. The left ventricle has no regional  wall motion abnormalities. Left ventricular diastolic parameters are  consistent with Grade II diastolic  dysfunction (pseudonormalization).   2. Right ventricular systolic function is normal. The right ventricular  size is normal. There is normal pulmonary artery systolic pressure.   3. The mitral valve is normal in structure. No evidence of mitral valve  regurgitation. No evidence of mitral stenosis.   4. The aortic valve is tricuspid. Aortic valve regurgitation is not  visualized. No aortic stenosis is present.   5. Aortic dilatation noted. There is mild dilatation at the level of the  sinuses of Valsalva measuring 39 mm.   6. The inferior vena cava is normal in size with greater than 50%  respiratory variability, suggesting right atrial pressure of 3 mmHg.   Cath 04/2020 Diagnostic Dominance: Right  Intervention   SUMMARY ? SEVERE THREE-VESSEL DISEASE with proximal RCA 60% followed by mid RCA 95% thrombotic subtotal occlusion (culprit lesion), 99% thrombotic stenosis of large bifurcating OM1 and 70 to 100% likely CTO occlusion of AV groove circumflex (unable to cross). ? Successful DES PCI of proximal to mid RCA using Resolute Onyx 4.0 mm x 34 mm postdilated to  5.2 mm proximal and 4.6 mm distal ? Successful DES PCI of ostial OM1 with Resolute Onyx DES  2.75 mm x15 mm postdilated to 2.95 mm ? Poor visualization on left ventriculogram.  Would recommend echocardiogram.  Could not evaluate wall motion, however EF did not appear to be decreased. ? Moderately elevated LVEDP. ? Severe systemic hypertension.     RECOMMENDATIONS ? Admit to CCU for ongoing care, or on Aggrastat  until current bottle complete. ? Aggressive risk factor modification-we will check lipid panel start atorvastatin  80 mg daily ? Start carvedilol  titrate accordingly blood pressure..  Medications ordered. ? Smoking cessation counseling ? 2D echo ordered ? With two-vessel PCI large territory involved, likely not a candidate for fast-track discharge.  Risk Assessment/Calculations:     HYPERTENSION CONTROL Vitals:   06/11/24 1435 06/11/24 1511  BP: (!) 160/72 (!) 142/78    The patient's blood pressure is elevated above target today. In order to address the patient's elevated BP: Blood pressure will be monitored at home to determine if medication changes need to be made.; Labs and/or other diagnostics are currently pending prior to making blood pressure medication adjustments.      Physical Exam:   VS:  BP (!) 142/78 (BP Location: Right Arm, Patient Position: Sitting)   Pulse 63   Ht 5' 10 (1.778 m)   Wt (!) 371 lb 3.2 oz (168.4 kg)   SpO2 96%   BMI 53.26 kg/m    Wt Readings from Last 3 Encounters:  06/11/24 (!) 371 lb 3.2 oz (168.4 kg)  03/11/24 (!) 377 lb (171 kg)  11/07/23 (!) 366 lb (166 kg)    GEN: Sitting in chair in no acute distress NECK: No JVD; No carotid bruits CARDIAC: RRR, no murmurs, rubs, gallops RESPIRATORY:  Clear to auscultation without rales, wheezing or rhonchi  ABDOMEN: Soft, non-tender, non-distended EXTREMITIES:  Trace edema; No deformity   ASSESSMENT AND PLAN: .   HTN  Last OV 02/2024, discontinued losartan  and started telmisartan . No follow up labs. Order BMP.  BP this office visit, not well controlled: 160/72. He suspect BP elevated  due to recent ambulation in building and waiting room.  Reports BP at home well controlled 120-140/60-70's. Continue to monitor BP at home.  Encourage lifestyle recommendations. Recommend water  aerobic exercise.  Continue 1/2 tablet of Telemisartan-hydrochlorothiazide  80-25 mg daily  and Lasix  40 mg in am daily. If Cr elevated, then would plan to d/c  hydrochlorothiazide  and increase Telemisartan to 80 mg.   CAD HLD, LDL goal < 55 s/p STEMI 05/05/20 with PCI/DES to RCA and OM1 with CTO to circumflex 02/2024: AST/ALT WNL; LDL 43 No CP.  Continue on Brilinta  60 mg twice daily, atorvastatin  80 mg daily, Zetia  10 mg daily, NTG as needed  HFimpEF Ischemic cardiomyopathy Diastolic dysfunction LE edema EF 45 to 50% in setting of STEMI in 04/2020 and most recent EF 65 to 70% in 07/2020 ECHO 07/2020: G2DD. Order ECHO Report ongoing chronic DOE and edema that has been unchanged. Notes SOB relieved with rest and relates this to being overweight and out of shape. Has been taking Lasix  40 mg daily in  pm due LE edema but has been up urinating all night. Advised to switch to am. Order BMP.  Continue carvedilol  12.5 mg twice daily and Lasix  40 mg daily in the am.   Aortic dilation Echo 07/2020: Mild dilation at the level of the sinuses of Valsalva measuring 39 mm Order repeat ECHO to monitor  Prediabetes  02/2024 A1C 6.2 Managed by PCP  Tobacco Use Quit smoking in 04/2020     Dispo: Follow up in 3 month with APP in [place] per patient request.   Signed, Metta Actis, PA-C

## 2024-06-11 ENCOUNTER — Ambulatory Visit: Attending: Student | Admitting: Physician Assistant

## 2024-06-11 ENCOUNTER — Encounter: Payer: Self-pay | Admitting: Physician Assistant

## 2024-06-11 ENCOUNTER — Other Ambulatory Visit: Payer: Self-pay | Admitting: Cardiology

## 2024-06-11 ENCOUNTER — Other Ambulatory Visit (HOSPITAL_COMMUNITY): Payer: Self-pay

## 2024-06-11 VITALS — BP 142/78 | HR 63 | Ht 70.0 in | Wt 371.2 lb

## 2024-06-11 DIAGNOSIS — I255 Ischemic cardiomyopathy: Secondary | ICD-10-CM

## 2024-06-11 DIAGNOSIS — I77810 Thoracic aortic ectasia: Secondary | ICD-10-CM

## 2024-06-11 DIAGNOSIS — R600 Localized edema: Secondary | ICD-10-CM | POA: Diagnosis not present

## 2024-06-11 DIAGNOSIS — I5189 Other ill-defined heart diseases: Secondary | ICD-10-CM

## 2024-06-11 DIAGNOSIS — I5032 Chronic diastolic (congestive) heart failure: Secondary | ICD-10-CM

## 2024-06-11 DIAGNOSIS — I10 Essential (primary) hypertension: Secondary | ICD-10-CM

## 2024-06-11 DIAGNOSIS — I2510 Atherosclerotic heart disease of native coronary artery without angina pectoris: Secondary | ICD-10-CM | POA: Diagnosis not present

## 2024-06-11 DIAGNOSIS — E7849 Other hyperlipidemia: Secondary | ICD-10-CM | POA: Diagnosis not present

## 2024-06-11 DIAGNOSIS — R7303 Prediabetes: Secondary | ICD-10-CM

## 2024-06-11 DIAGNOSIS — Z79899 Other long term (current) drug therapy: Secondary | ICD-10-CM | POA: Diagnosis not present

## 2024-06-11 MED ORDER — TICAGRELOR 60 MG PO TABS
60.0000 mg | ORAL_TABLET | Freq: Two times a day (BID) | ORAL | 2 refills | Status: DC
  Filled 2024-06-11: qty 180, 90d supply, fill #0

## 2024-06-11 MED ORDER — TELMISARTAN-HCTZ 80-25 MG PO TABS
0.5000 | ORAL_TABLET | Freq: Every day | ORAL | 3 refills | Status: AC
  Filled 2024-06-11: qty 45, 90d supply, fill #0
  Filled 2024-10-07: qty 45, 90d supply, fill #1
  Filled 2024-10-08: qty 30, 60d supply, fill #1
  Filled 2024-11-30 (×2): qty 30, 60d supply, fill #2

## 2024-06-11 NOTE — Patient Instructions (Signed)
 Medication Instructions:   Take Lasix  40 mg in the Morning   *If you need a refill on your cardiac medications before your next appointment, please call your pharmacy*  Lab Work: Your physician recommends that you return for lab work. ( BMP )   If you have labs (blood work) drawn today and your tests are completely normal, you will receive your results only by: MyChart Message (if you have MyChart) OR A paper copy in the mail If you have any lab test that is abnormal or we need to change your treatment, we will call you to review the results.  Testing/Procedures: Your physician has requested that you have an echocardiogram. Echocardiography is a painless test that uses sound waves to create images of your heart. It provides your doctor with information about the size and shape of your heart and how well your heart's chambers and valves are working. This procedure takes approximately one hour. There are no restrictions for this procedure. Please do NOT wear cologne, perfume, aftershave, or lotions (deodorant is allowed). Please arrive 15 minutes prior to your appointment time.  Please note: We ask at that you not bring children with you during ultrasound (echo/ vascular) testing. Due to room size and safety concerns, children are not allowed in the ultrasound rooms during exams. Our front office staff cannot provide observation of children in our lobby area while testing is being conducted. An adult accompanying a patient to their appointment will only be allowed in the ultrasound room at the discretion of the ultrasound technician under special circumstances. We apologize for any inconvenience.   Follow-Up: At [facility], you and your health needs are our priority.  As part of our continuing mission to provide you with exceptional heart care, our providers are all part of one team.  This team includes your primary Cardiologist (physician) and Advanced Practice Providers or APPs  (Physician Assistants and Nurse Practitioners) who all work together to provide you with the care you need, when you need it.  Your next appointment:   3 month(s)  Provider:   You will see one of the following Advanced Practice Providers on your designated Care Team:   [place], PA-C  [place], [place] Theotis Flake, [place]       We recommend signing up for the patient portal called MyChart.  Sign up information is provided on this After Visit Summary.  MyChart is used to connect with patients for Virtual Visits (Telemedicine).  Patients are able to view lab/test results, encounter notes, upcoming appointments, etc.  Non-urgent messages can be sent to your provider as well.   To learn more about what you can do with MyChart, go to [email or web address].   Other Instructions Thank you for choosing [facility]!

## 2024-06-12 ENCOUNTER — Other Ambulatory Visit: Payer: Self-pay | Admitting: Physician Assistant

## 2024-06-12 ENCOUNTER — Other Ambulatory Visit: Payer: Self-pay

## 2024-06-12 ENCOUNTER — Other Ambulatory Visit (HOSPITAL_COMMUNITY)
Admission: RE | Admit: 2024-06-12 | Discharge: 2024-06-12 | Disposition: A | Discharge status: Home / Self Care | Source: Ambulatory Visit | Attending: Physician Assistant | Admitting: Physician Assistant

## 2024-06-12 ENCOUNTER — Other Ambulatory Visit (HOSPITAL_COMMUNITY): Payer: Self-pay

## 2024-06-12 ENCOUNTER — Ambulatory Visit: Payer: Self-pay | Admitting: Physician Assistant

## 2024-06-12 DIAGNOSIS — Z79899 Other long term (current) drug therapy: Secondary | ICD-10-CM | POA: Insufficient documentation

## 2024-06-12 DIAGNOSIS — E876 Hypokalemia: Secondary | ICD-10-CM

## 2024-06-12 LAB — BASIC METABOLIC PANEL WITH GFR
Anion gap: 9 (ref 5–15)
BUN: 26 mg/dL — ABNORMAL HIGH (ref 8–23)
CO2: 27 mmol/L (ref 22–32)
Calcium: 9.3 mg/dL (ref 8.9–10.3)
Chloride: 102 mmol/L (ref 98–111)
Creatinine, Ser: 0.89 mg/dL (ref 0.61–1.24)
GFR, Estimated: >60 mL/min (ref >60)
Glucose, Bld: 128 mg/dL — ABNORMAL HIGH (ref 70–99)
Potassium: 3.3 mmol/L — ABNORMAL LOW (ref 3.5–5.1)
Sodium: 138 mmol/L (ref 135–145)

## 2024-06-12 MED ORDER — POTASSIUM CHLORIDE CRYS ER 20 MEQ PO TBCR: EXTENDED_RELEASE_TABLET | ORAL | 0 refills | Status: DC

## 2024-07-02 ENCOUNTER — Other Ambulatory Visit (HOSPITAL_COMMUNITY): Payer: Self-pay

## 2024-07-05 ENCOUNTER — Ambulatory Visit (HOSPITAL_COMMUNITY)

## 2024-07-16 ENCOUNTER — Other Ambulatory Visit (HOSPITAL_COMMUNITY): Payer: Self-pay

## 2024-07-18 ENCOUNTER — Ambulatory Visit (HOSPITAL_COMMUNITY)
Admission: RE | Admit: 2024-07-18 | Discharge: 2024-07-18 | Disposition: A | Discharge status: Home / Self Care | Source: Ambulatory Visit | Attending: Physician Assistant | Admitting: Physician Assistant

## 2024-07-18 DIAGNOSIS — I5032 Chronic diastolic (congestive) heart failure: Secondary | ICD-10-CM | POA: Diagnosis not present

## 2024-07-18 DIAGNOSIS — I77810 Thoracic aortic ectasia: Secondary | ICD-10-CM | POA: Insufficient documentation

## 2024-07-18 LAB — ECHOCARDIOGRAM COMPLETE
AR max vel: 2.98 cm2
AV Area VTI: 2.67 cm2
AV Area mean vel: 2.68 cm2
AV Mean grad: 6.5 mmHg
AV Peak grad: 11.6 mmHg
Ao pk vel: 1.7 m/s
Area-P 1/2: 3.03 cm2
S' Lateral: 3.2 cm

## 2024-07-18 NOTE — Progress Notes (Signed)
*  PRELIMINARY RESULTS* Echocardiogram 2D Echocardiogram has been performed.  Gordon Richards 07/18/2024, 9:32 AM

## 2024-07-31 DIAGNOSIS — Z1329 Encounter for screening for other suspected endocrine disorder: Secondary | ICD-10-CM | POA: Diagnosis not present

## 2024-07-31 DIAGNOSIS — E7849 Other hyperlipidemia: Secondary | ICD-10-CM | POA: Diagnosis not present

## 2024-07-31 DIAGNOSIS — I2510 Atherosclerotic heart disease of native coronary artery without angina pectoris: Secondary | ICD-10-CM | POA: Diagnosis not present

## 2024-07-31 DIAGNOSIS — I252 Old myocardial infarction: Secondary | ICD-10-CM | POA: Diagnosis not present

## 2024-07-31 DIAGNOSIS — Z125 Encounter for screening for malignant neoplasm of prostate: Secondary | ICD-10-CM | POA: Diagnosis not present

## 2024-07-31 DIAGNOSIS — I10 Essential (primary) hypertension: Secondary | ICD-10-CM | POA: Diagnosis not present

## 2024-07-31 DIAGNOSIS — L03116 Cellulitis of left lower limb: Secondary | ICD-10-CM | POA: Diagnosis not present

## 2024-08-06 ENCOUNTER — Other Ambulatory Visit: Payer: Self-pay | Admitting: Cardiology

## 2024-08-06 ENCOUNTER — Other Ambulatory Visit (HOSPITAL_COMMUNITY): Payer: Self-pay

## 2024-08-07 ENCOUNTER — Other Ambulatory Visit (HOSPITAL_COMMUNITY): Payer: Self-pay

## 2024-08-07 MED ORDER — CARVEDILOL 12.5 MG PO TABS
12.5000 mg | ORAL_TABLET | Freq: Two times a day (BID) | ORAL | 1 refills | Status: DC
  Filled 2024-08-07: qty 180, 90d supply, fill #0

## 2024-08-28 DIAGNOSIS — N390 Urinary tract infection, site not specified: Secondary | ICD-10-CM | POA: Diagnosis not present

## 2024-08-28 DIAGNOSIS — R509 Fever, unspecified: Secondary | ICD-10-CM | POA: Diagnosis not present

## 2024-08-29 DIAGNOSIS — N390 Urinary tract infection, site not specified: Secondary | ICD-10-CM | POA: Diagnosis not present

## 2024-09-19 NOTE — Progress Notes (Unsigned)
 Cardiology Office Note    Date:  09/20/2024  ID:  Gordon Richards, DOB 1961/06/10, MRN [id number] Cardiologist: Alm Clay, MD { :  History of Present Illness:    Gordon Richards is a 63 y.o. male with past medical history of CAD (s/p STEMI in 04/2020 with DES to RCA and OM1 with noted CTO of LCx), ischemic cardiomyopathy/HFimpEF (EF 45-50% by echo in 2021 and 65-70% by repeat echo in 07/2020), HTN, HLD and history of tobacco use who presents to the office today for 61-month follow-up.  He was last examined by Dena Kapur, PA in 05/2024 and reported having chronic dyspnea on exertion and lower extremity edema which had been unchanged. Was planning to make dietary changes and increase exercise for weight loss. No changes were made to his cardiac medications at that time. He had been on Telmisartan -HCTZ and Lasix  40 mg daily and a follow-up BMET and echocardiogram were recommended. Follow-up labs showed his creatinine was stable at 0.89 but K+ was low at 3.3.  Was started on potassium supplementation and plans were to have a follow-up BMET in 1 week but this has not yet been obtained. Echocardiogram showed a preserved EF of 60 to 65% with no regional wall motion abnormalities. He did have moderate LVH, normal RV function and trivial leakage along the mitral valve.  In talking with the patient today, he reports his biggest issue over the past several months has been continued hip pain which he feels like is exacerbated by his weight. Not currently a candidate for surgery given his weight but he has lost 11 pounds in the past few months. Recently completed a course of antibiotics for wounds along his legs.  Says that he was concerned he was septic almost a month ago as he had a syncopal episode and temperature of 103 but this significantly improved upon starting antibiotics. Breathing has overall been stable and no specific orthopnea or PND. No recent exertional chest pain or palpitations. Says that he  is no longer taking Lasix  routinely and has not utilized this in several weeks. He did self reduce Brilinta  to 60 mg once daily as he was trying to wean himself off the medication as Dr. Clay had previously told him that Brilinta  could be discontinued. He has also reduced Coreg  to 12.5 mg once daily.  Studies Reviewed:   EKG: EKG is not ordered today.  Echocardiogram: 06/2024 IMPRESSIONS     1. Left ventricular ejection fraction, by estimation, is 60 to 65%. The  left ventricle has normal function. The left ventricle has no regional  wall motion abnormalities. There is moderate left ventricular hypertrophy.  Left ventricular diastolic  parameters were normal.   2. Right ventricular systolic function is normal. The right ventricular  size is normal.   3. Left atrial size was moderately dilated.   4. The mitral valve is abnormal. Trivial mitral valve regurgitation. No  evidence of mitral stenosis.   5. The aortic valve is tricuspid. There is moderate calcification of the  aortic valve. There is moderate thickening of the aortic valve. Aortic  valve regurgitation is not visualized. Aortic valve sclerosis is present,  with no evidence of aortic valve  stenosis.   6. The inferior vena cava is normal in size with greater than 50%  respiratory variability, suggesting right atrial pressure of 3 mmHg.    Risk Assessment/Calculations:     HYPERTENSION CONTROL Vitals:   09/20/24 1332 09/20/24 1403  BP: (!) 150/85 (!) 146/78  The patient's blood pressure is elevated above target today. In order to address the patient's elevated BP: Blood pressure will be monitored at home to determine if medication changes need to be made.     Physical Exam:   VS:  BP (!) 146/78   Wt (!) 360 lb (163.3 kg)   SpO2 93%   BMI 51.65 kg/m    Wt Readings from Last 3 Encounters:  09/20/24 (!) 360 lb (163.3 kg)  06/11/24 (!) 371 lb 3.2 oz (168.4 kg)  03/11/24 (!) 377 lb (171 kg)     GEN:  Pleasant male appearing in no acute distress NECK: No JVD; No carotid bruits CARDIAC: RRR, no murmurs, rubs, gallops RESPIRATORY:  Clear to auscultation without rales, wheezing or rhonchi  ABDOMEN: Appears non-distended. No obvious abdominal masses. EXTREMITIES: No clubbing or cyanosis. No pitting edema. Erythema along left leg but improved per his report. No drainage noted today.  Distal pedal pulses are 2+ bilaterally.   Assessment and Plan:   1. Coronary artery disease involving native coronary artery of native heart without angina pectoris - He previously had a STEMI in 04/2020 with DES to the RCA and OM1 with known CTO of LCx. Activity is limited given hip pain but he denies any specific anginal symptoms. He has been taking Brilinta  60 mg once daily and says that Dr. Anner previously told him he could stop the medication but he wished to continue this.  He now wishes to stop Brilinta  and therefore will restart ASA 81 mg daily. Will reach out to Dr. Anner to verify as well. Continue Atorvastatin  80 mg daily, Coreg  6.25 mg twice daily and Zetia  10 mg daily.  2. Heart failure with improved ejection fraction (HFimpEF) (HCC) - His EF was previously 45 to 50% in 2021 and has normalized by repeat imaging. Was at 60 to 65% by most recent echocardiogram in 06/2024.  - He is no longer taking Lasix  but we reviewed we can take this as needed for worsening edema or acute weight gain. For now, remains on Telmisartan -HCTZ 40-12.5 mg daily. We discussed obtaining follow-up labs given that he had hypokalemia by most recent check but he wants to hold off on lab work at this time as he feels his potassium is normal.  3. Essential hypertension - BP is elevated during today's visit as it was initially recorded at 150/85, rechecked and at 146/78. As discussed above, he is only taking his Coreg  once daily and reviewed the importance of taking this is a twice daily medication  He prefers to take 6.25 mg twice  daily at this time. He has been checking his blood pressure at home and I encouraged him to make us  aware if this remains above goal. He is also on Telmisartan -HCTZ 80-25 mg daily but takes half of this as well.   4. Hyperlipidemia LDL goal <70 - LDL was at 43 when checked in 02/2024 with AST at 28 and ALT 43. Continue current medical therapy with Atorvastatin  80 mg daily and Zetia  10 mg daily.  Signed, Laymon CHRISTELLA Qua, PA-C

## 2024-09-20 ENCOUNTER — Encounter: Payer: Self-pay | Admitting: Student

## 2024-09-20 ENCOUNTER — Ambulatory Visit: Attending: Student | Admitting: Student

## 2024-09-20 VITALS — BP 146/78 | Wt 360.0 lb

## 2024-09-20 DIAGNOSIS — E785 Hyperlipidemia, unspecified: Secondary | ICD-10-CM

## 2024-09-20 DIAGNOSIS — I5032 Chronic diastolic (congestive) heart failure: Secondary | ICD-10-CM | POA: Diagnosis not present

## 2024-09-20 DIAGNOSIS — I2510 Atherosclerotic heart disease of native coronary artery without angina pectoris: Secondary | ICD-10-CM

## 2024-09-20 DIAGNOSIS — I10 Essential (primary) hypertension: Secondary | ICD-10-CM | POA: Diagnosis not present

## 2024-09-20 MED ORDER — ASPIRIN 81 MG PO TBEC
81.0000 mg | DELAYED_RELEASE_TABLET | Freq: Every day | ORAL | Status: AC
Start: 2024-09-20 — End: ?

## 2024-09-20 MED ORDER — CARVEDILOL 6.25 MG PO TABS: 6.2500 mg | ORAL_TABLET | Freq: Two times a day (BID) | ORAL | Status: DC

## 2024-09-20 NOTE — Patient Instructions (Signed)
 Medication Instructions:   Take Coreg  6.25 mg Twice a day   STOP Brilinta   Take Aspirin  81 mg daily   Labwork: None today  Testing/Procedures: None today  Follow-Up: 5-6 months Dr.Harding  Any Other Special Instructions Will Be Listed Below (If Applicable).  If you need a refill on your cardiac medications before your next appointment, please call your pharmacy.

## 2024-09-26 DIAGNOSIS — Z1329 Encounter for screening for other suspected endocrine disorder: Secondary | ICD-10-CM | POA: Diagnosis not present

## 2024-09-26 DIAGNOSIS — I10 Essential (primary) hypertension: Secondary | ICD-10-CM | POA: Diagnosis not present

## 2024-09-26 DIAGNOSIS — E7849 Other hyperlipidemia: Secondary | ICD-10-CM | POA: Diagnosis not present

## 2024-09-26 DIAGNOSIS — Z125 Encounter for screening for malignant neoplasm of prostate: Secondary | ICD-10-CM | POA: Diagnosis not present

## 2024-09-30 DIAGNOSIS — I2510 Atherosclerotic heart disease of native coronary artery without angina pectoris: Secondary | ICD-10-CM | POA: Diagnosis not present

## 2024-09-30 DIAGNOSIS — L03116 Cellulitis of left lower limb: Secondary | ICD-10-CM | POA: Diagnosis not present

## 2024-09-30 DIAGNOSIS — L0390 Cellulitis, unspecified: Secondary | ICD-10-CM | POA: Diagnosis not present

## 2024-09-30 DIAGNOSIS — I252 Old myocardial infarction: Secondary | ICD-10-CM | POA: Diagnosis not present

## 2024-09-30 DIAGNOSIS — Z8639 Personal history of other endocrine, nutritional and metabolic disease: Secondary | ICD-10-CM | POA: Diagnosis not present

## 2024-09-30 DIAGNOSIS — R7303 Prediabetes: Secondary | ICD-10-CM | POA: Diagnosis not present

## 2024-09-30 DIAGNOSIS — E7849 Other hyperlipidemia: Secondary | ICD-10-CM | POA: Diagnosis not present

## 2024-09-30 DIAGNOSIS — I10 Essential (primary) hypertension: Secondary | ICD-10-CM | POA: Diagnosis not present

## 2024-10-07 ENCOUNTER — Other Ambulatory Visit (HOSPITAL_COMMUNITY): Payer: Self-pay

## 2024-10-08 ENCOUNTER — Other Ambulatory Visit: Payer: Self-pay

## 2024-10-09 ENCOUNTER — Other Ambulatory Visit: Payer: Self-pay

## 2024-10-23 ENCOUNTER — Other Ambulatory Visit (HOSPITAL_COMMUNITY): Payer: Self-pay

## 2024-11-26 ENCOUNTER — Other Ambulatory Visit: Payer: Self-pay | Admitting: Cardiology

## 2024-11-26 ENCOUNTER — Other Ambulatory Visit (HOSPITAL_COMMUNITY): Payer: Self-pay

## 2024-11-30 ENCOUNTER — Other Ambulatory Visit (HOSPITAL_COMMUNITY): Payer: Self-pay

## 2024-11-30 ENCOUNTER — Other Ambulatory Visit: Payer: Self-pay | Admitting: Cardiology

## 2024-11-30 MED ORDER — CARVEDILOL 12.5 MG PO TABS
12.5000 mg | ORAL_TABLET | Freq: Two times a day (BID) | ORAL | 0 refills | Status: AC
  Filled 2024-11-30: qty 6, 3d supply, fill #0

## 2024-12-02 ENCOUNTER — Other Ambulatory Visit: Payer: Self-pay

## 2024-12-02 ENCOUNTER — Other Ambulatory Visit (HOSPITAL_COMMUNITY): Payer: Self-pay

## 2024-12-02 MED ORDER — CARVEDILOL 6.25 MG PO TABS
6.2500 mg | ORAL_TABLET | Freq: Two times a day (BID) | ORAL | 1 refills | Status: AC
  Filled 2024-12-02: qty 180, 90d supply, fill #0

## 2025-01-06 ENCOUNTER — Other Ambulatory Visit: Payer: Self-pay

## 2025-01-24 ENCOUNTER — Encounter: Payer: Self-pay | Admitting: Adult Health

## 2025-01-24 ENCOUNTER — Ambulatory Visit: Admitting: Adult Health

## 2025-01-24 VITALS — BP 116/66 | HR 80 | Ht 70.0 in | Wt 374.2 lb

## 2025-01-24 DIAGNOSIS — L309 Dermatitis, unspecified: Secondary | ICD-10-CM

## 2025-01-24 DIAGNOSIS — R7303 Prediabetes: Secondary | ICD-10-CM

## 2025-01-24 DIAGNOSIS — I872 Venous insufficiency (chronic) (peripheral): Secondary | ICD-10-CM | POA: Diagnosis not present

## 2025-01-24 DIAGNOSIS — E669 Obesity, unspecified: Secondary | ICD-10-CM | POA: Diagnosis not present

## 2025-01-24 DIAGNOSIS — Z87891 Personal history of nicotine dependence: Secondary | ICD-10-CM

## 2025-01-24 DIAGNOSIS — L0390 Cellulitis, unspecified: Secondary | ICD-10-CM

## 2025-01-24 DIAGNOSIS — Z6841 Body Mass Index (BMI) 40.0 and over, adult: Secondary | ICD-10-CM | POA: Diagnosis not present

## 2025-01-24 DIAGNOSIS — R0683 Snoring: Secondary | ICD-10-CM | POA: Diagnosis not present

## 2025-01-24 DIAGNOSIS — G4710 Hypersomnia, unspecified: Secondary | ICD-10-CM | POA: Diagnosis not present

## 2025-01-24 DIAGNOSIS — G4719 Other hypersomnia: Secondary | ICD-10-CM

## 2025-01-24 NOTE — Patient Instructions (Addendum)
 Set up for home sleep study  Work on healthy weight loss  Do not drive if sleepy  Follow up in 6 weeks to discuss results and treatment

## 2025-01-24 NOTE — Progress Notes (Signed)
 "  @Patient  ID: Gordon Richards, male    DOB: Aug 22, 1961, 64 y.o.   MRN: [id number]  Chief Complaint  Patient presents with   Consult    sleep    Referring provider: [facility]  HPI: 64 year old male seen for sleep consult January 24, 2025 for loud snoring, restless sleep and daytime sleepiness Medical history significant for coronary artery disease, congestive heart failure with preserved EF, hypertension and morbid obesity    TEST/EVENTS : Reviewed 01/24/2025  Discussed the use of AI scribe software for clinical note transcription with the patient, who gave verbal consent to proceed.  History of Present Illness Gordon Richards is a 64 year old male with coronary artery disease and morbid obesity who presents with symptoms suggestive of sleep apnea.   He experiences symptoms suggestive of sleep apnea, including snoring, restless sleep, waking up tired, and daytime sleepiness. He also has frequent nighttime awakenings to urinate, approximately every hour to hour and a half. He has used melatonin in the past for sleep but is not currently on any sleep aids. He experiences unintentional napping during the day.  He has a history of coronary artery disease, having had a myocardial infarction in May 2021, which led to the placement of two stents. He quit smoking on the day of his myocardial infarction. Since then, he has gained significant weight, patient has gained around 175 pounds over the last 10 years.  Current weight is 374 pounds with a BMI at 53. Has tried several diets in the past unsuccessfully.  Used to be very active but is unable to exercise as he needs hip replacements and uses a rolling walker to get around.  Has chronic hip pain.  He has a history of hip issues, with no cartilage in his hips, and has been advised to lose weight before hip replacement surgery can be considered. He is no longer able to engage in physical activities he once enjoyed, such as running a  optician, dispensing.  No history of stroke, thyroid  cancer, severe bowel disease, or liver disease. He is not on any diabetic medications but was told he is prediabetic with an A1C of 6.1. He does not consume alcohol or use drugs and has been married for 45 years, living in the [place] area. 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     Allergies[1]   There is no immunization history on file for this patient.  Past Medical History:  Diagnosis Date   Acute ST elevation myocardial infarction (STEMI) of inferior wall (HCC) 05/07/2020   Likely culprit lesion was 95% thrombotic mRCA (in tandem with pRCA 60%) - Lesion #1 DES PCI; OM1 also had 95% lesion (porential culprit - Lesion #2 DES PCI); ~CTO of AVG Cx after OM1 (unable to cross). EF 45-50% - Mod LVEDP with severe systemic HTN.    CAD S/P DES PCI - pRCA, ostOM1. CTO of AVG Cx 05/08/2020    pRCA 60%& mRCA 95% (Resolute Onyx DES 4.0 mm x 34 mm -> postdilated from 5.2-4.6 mm), RPAV 40% -RPL 2; OM1 95% (Resolute Onyx DES 2.75 mm x15 mm-2.95 mm), OM1 60%, small caliber LP AV 65% and 99% granulated with no loss (likely CTO); p-mLAD 40%, mLAD 40%.   Hyperlipidemia due to dietary fat intake 05/08/2020   Morbid obesity (HCC) 05/08/2020    Tobacco History: Tobacco Use History[2] Counseling given: Not Answered   Outpatient Medications Prior to  Visit  Medication Sig Dispense Refill   aspirin  EC 81 MG tablet Take 1 tablet (81 mg total) by mouth daily. Swallow whole.     atorvastatin  (LIPITOR ) 80 MG tablet Take 1 tablet (80 mg total) by mouth daily. 90 tablet 3   carvedilol  (COREG ) 6.25 MG tablet Take 1 tablet (6.25 mg total) by mouth 2 (two) times daily. 180 tablet 1   Cholecalciferol (VITAMIN D3) 25 MCG (1000 UT) CAPS Take by mouth.     diphenhydramine-acetaminophen  (TYLENOL  PM) 25-500 MG TABS tablet Take 1 tablet by mouth at bedtime as needed.     ezetimibe  (ZETIA ) 10 MG tablet  Take 1 tablet (10 mg total) by mouth daily. Please keep upcoming appointment in March 2025 for future refills. Thank you 90 tablet 3   furosemide  (LASIX ) 40 MG tablet Take 1 tablet (40 mg total) by mouth daily as needed. Take in the morning. 90 tablet 3   ibuprofen (ADVIL) 800 MG tablet Take 800 mg by mouth every 4 (four) hours.     mupirocin ointment (BACTROBAN) 2 % Apply 1 Application topically 2 (two) times daily.     nitroGLYCERIN  (NITROSTAT ) 0.4 MG SL tablet Place 1 tablet (0.4 mg total) under the tongue every 5 (five) minutes x 3 doses as needed for chest pain. 25 tablet 1   potassium chloride  (MICRO-K ) 10 MEQ CR capsule Take 10 mEq by mouth daily.     sulfamethoxazole -trimethoprim  (BACTRIM  DS) 800-160 MG tablet Take 1 tablet by mouth 2 (two) times daily.     telmisartan -hydrochlorothiazide  (MICARDIS  HCT) 80-25 MG tablet Take 0.5 tablets by mouth daily. 90 tablet 3   triamcinolone cream (KENALOG) 0.1 % Apply topically 2 (two) times daily.     carvedilol  (COREG ) 12.5 MG tablet Take 1 tablet (12.5 mg total) by mouth 2 (two) times daily. (Patient not taking: Reported on 01/24/2025) 6 tablet 0   No facility-administered medications prior to visit.     Review of Systems:   Constitutional:   No  weight loss, night sweats,  Fevers, chills, +fatigue, or  lassitude.  HEENT:   No headaches,  Difficulty swallowing,  Tooth/dental problems, or  Sore throat,                No sneezing, itching, ear ache, nasal congestion, post nasal drip,   CV:  No chest pain,  Orthopnea, PND, +swelling in lower extremities, anasarca, dizziness, palpitations, syncope.   GI  No heartburn, indigestion, abdominal pain, nausea, vomiting, diarrhea, change in bowel habits, loss of appetite, bloody stools.   Resp:   No chest wall deformity  Skin: Lower leg skin infection currently on antibiotics  GU: no dysuria, change in color of urine, no urgency or frequency.  No flank pain, no hematuria   MS:  No joint pain or  swelling.  No decreased range of motion.  No back pain.    Physical Exam  BP 116/66   Pulse 80   Ht 5' 10 (1.778 m) Comment: Per pt  Wt (!) 374 lb 3.2 oz (169.7 kg)   SpO2 90% Comment: RA  BMI 53.69 kg/m   GEN: A/Ox3; pleasant , NAD, well nourished, rolling walker   HEENT:  [place]/AT,   NOSE-clear, THROAT-clear, no lesions, no postnasal drip or exudate noted.  Class III-IV MP airway  NECK:  Supple w/ fair ROM; no JVD; normal carotid impulses w/o bruits; no thyromegaly or nodules palpated; no lymphadenopathy.    RESP  Clear  P & A; w/o, wheezes/ rales/  or rhonchi. no accessory muscle use, no dullness to percussion  CARD:  RRR, no m/r/g, 2+ peripheral edema, pulses intact, no cyanosis or clubbing.  GI:   Soft & nt; nml bowel sounds; no organomegaly or masses detected.   Musco: Warm bil, no deformities or joint swelling noted.   Neuro: alert, no focal deficits noted.    Skin: Warm, lower extremities with stasis dermatitis changes, redness, scattered blisters    Lab Results:Reviewed 01/24/2025   CBC   BMET   BNP No results found for: BNP  ProBNP No results found for: PROBNP  Imaging: No results found.  Administration History     None           No data to display          No results found for: NITRICOXIDE     01/24/2025   10:00 AM  Results of the [facility] flowsheet  Sitting and reading 2  Watching TV 3  Sitting, inactive in a public place (e.g. a theatre or a meeting) 1  As a passenger in a car for an hour without a break 3  Lying down to rest in the afternoon when circumstances permit 3  Sitting and talking to someone 0  Sitting quietly after a lunch without alcohol 0  In a car, while stopped for a few minutes in traffic 1  Total score 13        Assessment & Plan:   Assessment and Plan Assessment & Plan Suspected obstructive sleep apnea  -with significant daily symptom burden He presents with symptoms of snoring, daytime  sleepiness, and restless sleep, with risk factors including obesity and coronary artery disease. The home sleep study process was explained.  A home sleep study through [employer] was ordered. Treatment options will be discussed based on sleep study results.  - discussed how weight can impact sleep and risk for sleep disordered breathing - discussed options to assist with weight loss: combination of diet modification, cardiovascular and strength training exercises   - had an extensive discussion regarding the adverse health consequences related to untreated sleep disordered breathing - specifically discussed the risks for hypertension, coronary artery disease, cardiac dysrhythmias, cerebrovascular disease, and diabetes - lifestyle modification discussed   - discussed how sleep disruption can increase risk of accidents, particularly when driving - safe driving practices were discussed    Venous insufficiency with stasis dermatitis and recurrent cellulitis   He has chronic venous insufficiency with stasis dermatitis and recurrent cellulitis, presenting with blisters, cuts, and infections on the legs. Current treatment includes antibiotics. The need for wound care and support stockings to manage venous insufficiency was discussed.  SABRA He will complete the current course of antibiotics .  Follow-up with primary care for ongoing management  Obesity  -ongoing difficulties losing weight.  Current BMI is at 53.  Weight loss is important for overall health and potential surgical eligibility. He will continue the current weight loss program. The potential use of Zepbound for weight loss will be discussed if sleep apnea is confirmed. A referral to a healthy weight and wellness program was discussed.  Can consider at next follow-up  Plan  Patient Instructions  Set up for home sleep study  Work on healthy weight loss  Do not drive if sleepy  Follow up in 6 weeks to discuss results and treatment           Tammala Weider, NP 01/24/2025  I spent  40  minutes dedicated to the care of  this patient on the date of this encounter to include pre-visit review of records, face-to-face time with the patient discussing conditions above, post visit ordering of testing, clinical documentation with the electronic health record, making appropriate referrals as documented, and communicating necessary findings to members of the patients care team.      [1] No Known Allergies [2]  Social History Tobacco Use  Smoking Status Former   Current packs/day: 1.00   Average packs/day: 1 pack/day for 5.0 years (5.0 ttl pk-yrs)   Types: Cigarettes  Smokeless Tobacco Never   "

## 2025-01-28 ENCOUNTER — Telehealth: Payer: Self-pay | Admitting: *Deleted

## 2025-01-28 NOTE — Telephone Encounter (Signed)
 Copied from CRM #[id number]. Topic: Clinical - Medication Question >> Jan 24, 2025  3:59 PM Rilla B wrote: Reason for CRM: Patient is calling to let Ms Parrett know Zepbound is not approved through his insurance [employer]).  Munjaro and Ozempic both are approved through his insurance. Please call patient @ [phone] Or Email: [email or web address]  Tammy, please advise if you want to prescribe one of the covered alternatives to Zepbound, thanks!

## 2025-01-28 NOTE — Telephone Encounter (Signed)
 Thanks for the information. We can discuss on return visit once sleep study results are back.

## 2025-01-29 NOTE — Telephone Encounter (Signed)
 Will be discussed at next visit.NFN as of now

## 2025-03-10 ENCOUNTER — Ambulatory Visit: Admitting: Adult Health
